# Patient Record
Sex: Female | Born: 1940
Health system: Southern US, Community
[De-identification: ages and names within clinical notes are randomized; demographics above are authoritative.]

## PROBLEM LIST (undated history)

## (undated) DIAGNOSIS — R911 Solitary pulmonary nodule: Secondary | ICD-10-CM

## (undated) DIAGNOSIS — F419 Anxiety disorder, unspecified: Secondary | ICD-10-CM

## (undated) DIAGNOSIS — M1990 Unspecified osteoarthritis, unspecified site: Secondary | ICD-10-CM

## (undated) DIAGNOSIS — G2581 Restless legs syndrome: Secondary | ICD-10-CM

## (undated) DIAGNOSIS — T8859XA Other complications of anesthesia, initial encounter: Secondary | ICD-10-CM

## (undated) DIAGNOSIS — I77810 Thoracic aortic ectasia: Secondary | ICD-10-CM

## (undated) DIAGNOSIS — C649 Malignant neoplasm of unspecified kidney, except renal pelvis: Secondary | ICD-10-CM

## (undated) DIAGNOSIS — E039 Hypothyroidism, unspecified: Secondary | ICD-10-CM

## (undated) DIAGNOSIS — I712 Thoracic aortic aneurysm, without rupture, unspecified: Secondary | ICD-10-CM

## (undated) DIAGNOSIS — R200 Anesthesia of skin: Secondary | ICD-10-CM

## (undated) DIAGNOSIS — E785 Hyperlipidemia, unspecified: Secondary | ICD-10-CM

## (undated) DIAGNOSIS — Z8719 Personal history of other diseases of the digestive system: Secondary | ICD-10-CM

## (undated) DIAGNOSIS — R7301 Impaired fasting glucose: Secondary | ICD-10-CM

## (undated) DIAGNOSIS — H9190 Unspecified hearing loss, unspecified ear: Secondary | ICD-10-CM

## (undated) DIAGNOSIS — N8501 Benign endometrial hyperplasia: Secondary | ICD-10-CM

## (undated) DIAGNOSIS — T4145XA Adverse effect of unspecified anesthetic, initial encounter: Secondary | ICD-10-CM

## (undated) DIAGNOSIS — J45909 Unspecified asthma, uncomplicated: Secondary | ICD-10-CM

## (undated) DIAGNOSIS — R55 Syncope and collapse: Secondary | ICD-10-CM

## (undated) DIAGNOSIS — C801 Malignant (primary) neoplasm, unspecified: Secondary | ICD-10-CM

## (undated) DIAGNOSIS — I499 Cardiac arrhythmia, unspecified: Secondary | ICD-10-CM

## (undated) DIAGNOSIS — L719 Rosacea, unspecified: Secondary | ICD-10-CM

## (undated) DIAGNOSIS — I10 Essential (primary) hypertension: Secondary | ICD-10-CM

## (undated) DIAGNOSIS — K635 Polyp of colon: Secondary | ICD-10-CM

## (undated) DIAGNOSIS — R319 Hematuria, unspecified: Secondary | ICD-10-CM

## (undated) DIAGNOSIS — M8580 Other specified disorders of bone density and structure, unspecified site: Secondary | ICD-10-CM

## (undated) HISTORY — DX: Restless legs syndrome: G25.81

## (undated) HISTORY — DX: Impaired fasting glucose: R73.01

## (undated) HISTORY — DX: Solitary pulmonary nodule: R91.1

## (undated) HISTORY — DX: Thoracic aortic ectasia: I77.810

## (undated) HISTORY — PX: SP CHOLECYSTOMY: HXRAD409

## (undated) HISTORY — DX: Malignant neoplasm of unspecified kidney, except renal pelvis: C64.9

## (undated) HISTORY — DX: Other specified disorders of bone density and structure, unspecified site: M85.80

## (undated) HISTORY — DX: Polyp of colon: K63.5

## (undated) HISTORY — DX: Thoracic aortic aneurysm, without rupture: I71.2

## (undated) HISTORY — DX: Hyperlipidemia, unspecified: E78.5

## (undated) HISTORY — PX: EYE SURGERY: SHX253

## (undated) HISTORY — PX: BUNIONECTOMY: SHX129

## (undated) HISTORY — DX: Hypothyroidism, unspecified: E03.9

## (undated) HISTORY — PX: OTHER SURGICAL HISTORY: SHX169

## (undated) HISTORY — DX: Benign endometrial hyperplasia: N85.01

## (undated) HISTORY — DX: Essential (primary) hypertension: I10

## (undated) HISTORY — PX: TONSILLECTOMY: SUR1361

## (undated) HISTORY — DX: Unspecified asthma, uncomplicated: J45.909

## (undated) HISTORY — DX: Rosacea, unspecified: L71.9

---

## 1974-10-18 HISTORY — PX: TUBAL LIGATION: SHX77

## 1988-10-18 HISTORY — PX: CRANIECTOMY FOR EXCISION OF ACOUSTIC NEUROMA: SUR324

## 1998-04-23 ENCOUNTER — Emergency Department (HOSPITAL_COMMUNITY): Admission: EM | Admit: 1998-04-23 | Discharge: 1998-04-23 | Payer: Self-pay | Admitting: Emergency Medicine

## 1999-01-15 ENCOUNTER — Other Ambulatory Visit: Admission: RE | Admit: 1999-01-15 | Discharge: 1999-01-15 | Payer: Self-pay | Admitting: Gynecology

## 1999-10-27 ENCOUNTER — Encounter (INDEPENDENT_AMBULATORY_CARE_PROVIDER_SITE_OTHER): Payer: Self-pay | Admitting: Specialist

## 1999-10-27 ENCOUNTER — Other Ambulatory Visit: Admission: RE | Admit: 1999-10-27 | Discharge: 1999-10-27 | Payer: Self-pay | Admitting: Gynecology

## 2000-01-19 ENCOUNTER — Other Ambulatory Visit: Admission: RE | Admit: 2000-01-19 | Discharge: 2000-01-19 | Payer: Self-pay | Admitting: Gynecology

## 2001-01-18 ENCOUNTER — Other Ambulatory Visit: Admission: RE | Admit: 2001-01-18 | Discharge: 2001-01-18 | Payer: Self-pay | Admitting: Gynecology

## 2001-01-20 ENCOUNTER — Other Ambulatory Visit: Admission: RE | Admit: 2001-01-20 | Discharge: 2001-01-20 | Payer: Self-pay | Admitting: Gynecology

## 2001-01-20 ENCOUNTER — Encounter (INDEPENDENT_AMBULATORY_CARE_PROVIDER_SITE_OTHER): Payer: Self-pay

## 2002-03-20 ENCOUNTER — Other Ambulatory Visit: Admission: RE | Admit: 2002-03-20 | Discharge: 2002-03-20 | Payer: Self-pay | Admitting: Gynecology

## 2002-10-18 HISTORY — PX: CATARACT EXTRACTION: SUR2

## 2004-05-19 ENCOUNTER — Other Ambulatory Visit: Admission: RE | Admit: 2004-05-19 | Discharge: 2004-05-19 | Payer: Self-pay | Admitting: Obstetrics and Gynecology

## 2004-09-23 ENCOUNTER — Encounter: Admission: RE | Admit: 2004-09-23 | Discharge: 2004-09-23 | Payer: Self-pay | Admitting: Internal Medicine

## 2005-02-04 ENCOUNTER — Encounter: Admission: RE | Admit: 2005-02-04 | Discharge: 2005-02-04 | Payer: Self-pay | Admitting: Otolaryngology

## 2005-06-25 ENCOUNTER — Other Ambulatory Visit: Admission: RE | Admit: 2005-06-25 | Discharge: 2005-06-25 | Payer: Self-pay | Admitting: Obstetrics and Gynecology

## 2005-10-18 HISTORY — PX: WRIST SURGERY: SHX841

## 2006-10-18 HISTORY — PX: OTHER SURGICAL HISTORY: SHX169

## 2006-10-26 ENCOUNTER — Other Ambulatory Visit: Admission: RE | Admit: 2006-10-26 | Discharge: 2006-10-26 | Payer: Self-pay | Admitting: Obstetrics and Gynecology

## 2008-05-01 ENCOUNTER — Other Ambulatory Visit: Admission: RE | Admit: 2008-05-01 | Discharge: 2008-05-01 | Payer: Self-pay | Admitting: Obstetrics and Gynecology

## 2008-10-18 HISTORY — PX: EXCISION MORTON'S NEUROMA: SHX5013

## 2008-10-18 HISTORY — PX: OTHER SURGICAL HISTORY: SHX169

## 2009-05-15 ENCOUNTER — Encounter: Admission: RE | Admit: 2009-05-15 | Discharge: 2009-05-15 | Payer: Self-pay | Admitting: Internal Medicine

## 2009-05-28 ENCOUNTER — Encounter (INDEPENDENT_AMBULATORY_CARE_PROVIDER_SITE_OTHER): Payer: Self-pay | Admitting: Interventional Radiology

## 2009-05-28 ENCOUNTER — Other Ambulatory Visit: Admission: RE | Admit: 2009-05-28 | Discharge: 2009-05-28 | Payer: Self-pay | Admitting: Interventional Radiology

## 2009-05-28 ENCOUNTER — Encounter: Admission: RE | Admit: 2009-05-28 | Discharge: 2009-05-28 | Payer: Self-pay | Admitting: Internal Medicine

## 2009-11-18 HISTORY — PX: THYROIDECTOMY: SHX17

## 2011-04-14 ENCOUNTER — Other Ambulatory Visit: Payer: Self-pay | Admitting: Gastroenterology

## 2011-04-19 ENCOUNTER — Ambulatory Visit
Admission: RE | Admit: 2011-04-19 | Discharge: 2011-04-19 | Disposition: A | Discharge status: Home / Self Care | Payer: Medicare Other | Source: Ambulatory Visit | Attending: Gastroenterology | Admitting: Gastroenterology

## 2011-04-19 MED ORDER — IOHEXOL 300 MG/ML  SOLN: 125.0000 mL | Freq: Once | INTRAMUSCULAR | Status: AC | PRN

## 2011-10-19 DIAGNOSIS — I7121 Aneurysm of the ascending aorta, without rupture: Secondary | ICD-10-CM

## 2011-10-19 DIAGNOSIS — I712 Thoracic aortic aneurysm, without rupture: Secondary | ICD-10-CM

## 2011-10-19 HISTORY — PX: EYE SURGERY: SHX253

## 2011-10-19 HISTORY — DX: Thoracic aortic aneurysm, without rupture: I71.2

## 2011-10-19 HISTORY — DX: Aneurysm of the ascending aorta, without rupture: I71.21

## 2011-11-03 DIAGNOSIS — D235 Other benign neoplasm of skin of trunk: Secondary | ICD-10-CM | POA: Diagnosis not present

## 2011-11-03 DIAGNOSIS — L570 Actinic keratosis: Secondary | ICD-10-CM | POA: Diagnosis not present

## 2011-12-14 DIAGNOSIS — H43819 Vitreous degeneration, unspecified eye: Secondary | ICD-10-CM | POA: Diagnosis not present

## 2011-12-14 DIAGNOSIS — H35379 Puckering of macula, unspecified eye: Secondary | ICD-10-CM | POA: Diagnosis not present

## 2011-12-16 DIAGNOSIS — E785 Hyperlipidemia, unspecified: Secondary | ICD-10-CM | POA: Diagnosis not present

## 2011-12-16 DIAGNOSIS — I10 Essential (primary) hypertension: Secondary | ICD-10-CM | POA: Diagnosis not present

## 2011-12-16 DIAGNOSIS — E739 Lactose intolerance, unspecified: Secondary | ICD-10-CM | POA: Diagnosis not present

## 2011-12-16 DIAGNOSIS — E669 Obesity, unspecified: Secondary | ICD-10-CM | POA: Diagnosis not present

## 2012-02-04 DIAGNOSIS — E039 Hypothyroidism, unspecified: Secondary | ICD-10-CM | POA: Diagnosis not present

## 2012-03-09 DIAGNOSIS — M216X9 Other acquired deformities of unspecified foot: Secondary | ICD-10-CM | POA: Diagnosis not present

## 2012-03-16 DIAGNOSIS — Z1231 Encounter for screening mammogram for malignant neoplasm of breast: Secondary | ICD-10-CM | POA: Diagnosis not present

## 2012-03-24 DIAGNOSIS — L719 Rosacea, unspecified: Secondary | ICD-10-CM | POA: Diagnosis not present

## 2012-03-24 DIAGNOSIS — L570 Actinic keratosis: Secondary | ICD-10-CM | POA: Diagnosis not present

## 2012-03-24 DIAGNOSIS — D235 Other benign neoplasm of skin of trunk: Secondary | ICD-10-CM | POA: Diagnosis not present

## 2012-03-31 DIAGNOSIS — H43399 Other vitreous opacities, unspecified eye: Secondary | ICD-10-CM | POA: Diagnosis not present

## 2012-03-31 DIAGNOSIS — H26499 Other secondary cataract, unspecified eye: Secondary | ICD-10-CM | POA: Diagnosis not present

## 2012-05-23 DIAGNOSIS — K219 Gastro-esophageal reflux disease without esophagitis: Secondary | ICD-10-CM | POA: Diagnosis not present

## 2012-05-23 DIAGNOSIS — R1011 Right upper quadrant pain: Secondary | ICD-10-CM | POA: Diagnosis not present

## 2012-05-23 DIAGNOSIS — E785 Hyperlipidemia, unspecified: Secondary | ICD-10-CM | POA: Diagnosis not present

## 2012-05-23 DIAGNOSIS — I10 Essential (primary) hypertension: Secondary | ICD-10-CM | POA: Diagnosis not present

## 2012-06-27 DIAGNOSIS — E785 Hyperlipidemia, unspecified: Secondary | ICD-10-CM | POA: Diagnosis not present

## 2012-06-27 DIAGNOSIS — M899 Disorder of bone, unspecified: Secondary | ICD-10-CM | POA: Diagnosis not present

## 2012-06-27 DIAGNOSIS — H35379 Puckering of macula, unspecified eye: Secondary | ICD-10-CM | POA: Diagnosis not present

## 2012-06-27 DIAGNOSIS — M949 Disorder of cartilage, unspecified: Secondary | ICD-10-CM | POA: Diagnosis not present

## 2012-06-27 DIAGNOSIS — R7301 Impaired fasting glucose: Secondary | ICD-10-CM | POA: Diagnosis not present

## 2012-06-27 DIAGNOSIS — H43819 Vitreous degeneration, unspecified eye: Secondary | ICD-10-CM | POA: Diagnosis not present

## 2012-06-27 DIAGNOSIS — E039 Hypothyroidism, unspecified: Secondary | ICD-10-CM | POA: Diagnosis not present

## 2012-06-27 DIAGNOSIS — I10 Essential (primary) hypertension: Secondary | ICD-10-CM | POA: Diagnosis not present

## 2012-07-04 DIAGNOSIS — Z0000 Encounter for general adult medical examination without abnormal findings: Secondary | ICD-10-CM | POA: Diagnosis not present

## 2012-07-04 DIAGNOSIS — Z23 Encounter for immunization: Secondary | ICD-10-CM | POA: Diagnosis not present

## 2012-07-04 DIAGNOSIS — E039 Hypothyroidism, unspecified: Secondary | ICD-10-CM | POA: Diagnosis not present

## 2012-07-04 DIAGNOSIS — I10 Essential (primary) hypertension: Secondary | ICD-10-CM | POA: Diagnosis not present

## 2012-07-04 DIAGNOSIS — E785 Hyperlipidemia, unspecified: Secondary | ICD-10-CM | POA: Diagnosis not present

## 2012-07-05 DIAGNOSIS — Z124 Encounter for screening for malignant neoplasm of cervix: Secondary | ICD-10-CM | POA: Diagnosis not present

## 2012-07-05 DIAGNOSIS — Z01419 Encounter for gynecological examination (general) (routine) without abnormal findings: Secondary | ICD-10-CM | POA: Diagnosis not present

## 2012-07-05 DIAGNOSIS — J984 Other disorders of lung: Secondary | ICD-10-CM | POA: Diagnosis not present

## 2012-07-06 DIAGNOSIS — Z1212 Encounter for screening for malignant neoplasm of rectum: Secondary | ICD-10-CM | POA: Diagnosis not present

## 2012-07-19 DIAGNOSIS — M899 Disorder of bone, unspecified: Secondary | ICD-10-CM | POA: Diagnosis not present

## 2012-08-07 ENCOUNTER — Institutional Professional Consult (permissible substitution) (INDEPENDENT_AMBULATORY_CARE_PROVIDER_SITE_OTHER): Payer: Medicare Other | Admitting: Cardiothoracic Surgery

## 2012-08-07 VITALS — BP 132/85 | HR 92 | Resp 20 | Ht 64.5 in | Wt 210.0 lb

## 2012-08-07 DIAGNOSIS — I712 Thoracic aortic aneurysm, without rupture: Secondary | ICD-10-CM

## 2012-08-07 DIAGNOSIS — I77810 Thoracic aortic ectasia: Secondary | ICD-10-CM

## 2012-08-07 HISTORY — DX: Thoracic aortic ectasia: I77.810

## 2012-08-07 NOTE — Patient Instructions (Signed)
Thoracic Aortic Aneurysm An aneurysm is the enlargement (dilatation), bulging or ballooning out of part of the wall of a vein or artery. An Aortic Aneurysm is a bulging in the largest artery of the body. This artery supplies blood from the heart to the rest of the body. The first part of the aorta is called the thoracic aorta. It leaves the heart, ascends (rises), arches, and descends (goes down) through the chest until it reaches the diaphragm (the muscular partition between the chest and abdomen (belly). The second part of the aorta is then called the abdominal aorta after it has passed the diaphragm and continues down through the abdomen. The abdominal aorta ends where it splits to form the two iliac arteries that go to the legs. Aortic aneurysms can develop anywhere along the length of the aorta. A thoracic aortic aneurysm (TAA) occurs in the first part of the aorta, between the heart and the diaphragm. The major importance of an aneurysm is that it can rupture or tear (dissect), causing death unless diagnosed and treated promptly. CAUSES  Most thoracic aortic aneurysms are related to arteriosclerosis. The arteriosclerosis can weaken the aortic wall. The pressure of the blood being pumped through the aorta causes it to balloon out at the site of weakness. Therefore, elevated blood pressure (hypertension) is associated with aneurysm. Other risk factors include:  Age over 60.  Tobacco use.  Female sex.  Family history of aneurysm. Additional causes of thoracic aortic aneurysms include:  Genetics (passed by birth).  Injury: After physical trauma to the aorta.  Inflammation of blood vessels.  Hardening of the arteries.  Infection. SYMPTOMS  Many aneurysms do not cause problems. A small, unchanging or slowly changing aneurysm may produce no symptoms until it suddenly ruptures or dissects (separation of the layers of the aortic wall) without warning. It may then cause death. The symptoms  (problems) of a developing aneurysm will partly depend on its size and rate of growth. Thoracic aortic aneurysms may cause pain in the:  Chest.  Back.  Sides.  Abdomen. The pain most often has a deep quality as if it is boring into the person. It may cause:  Heart failure.  Heart attack.  Hoarseness.  Cough.  Shortness of breath.  Swallowing problems. DIAGNOSIS  A thoracic aortic aneurysm may be suspected based on your symptoms. It may also be detected by x-ray or CT studies done for unrelated reasons.  Several different imaging studies can be used to confirm a TAA:  An echocardiogram is an ultrasound test to examine the heart. It can also examine the first parts of the aorta. Sometimes, this test is done by putting you to sleep and inserting a flexible telescope through your mouth into your esophagus, which is next to the aorta; excellent pictures of the aorta can be obtained. This is called a transesophageal echocardiogram (TEE).  CT scanning of the chest is accurate at showing the exact size and shape of the aneurysm.  MRI scanning is accurate, and is used for certain types of TAA.  An aortic angiogram shows the source of the major blood vessels arising from the aorta. It reveals the size and extent of any aneurysm. It can also show a clot clinging to the wall of the aneurysm (mural thrombus). The angiogram may give information about a tear of the aorta. TREATMENT  Treatment for a thoracic aortic aneurysm depends on:  Location.  Size.  Other factors.  Rate of growth.  Underlying cause. Medical treatment is used   for smaller or complicated aneurysms, or those that do not cause symptoms. These include:  Stopping smoking.  Blood pressure control.  Control of cholesterol. Surgical treatment is used for aneurysms that cause symptoms, or for those that are large or growing in size. The surgical technique depends on the location of the aneurysm. HOME CARE INSTRUCTIONS     If you smoke, stop. If you do not, do not start.  Take all medications as prescribed.  Your caregiver will tell you when to have your aneurysm rechecked, either by echocardiogram or CT scan. Be sure to keep this and all follow-up appointments. SEEK MEDICAL CARE IF:   You develop mild pain in your chest, upper back, sides, or abdomen.  You develop cough, hoarseness or trouble swallowing. SEEK IMMEDIATE MEDICAL CARE IF:   You develop severe chest or abdominal pain, or severe pain moving (radiating) to your back.  You suddenly develop cold or blue toes or feet.  You suddenly develop lightheadedness or fainting spells.  You develop trouble breathing. Document Released: 10/04/2005 Document Revised: 12/27/2011 Document Reviewed: 08/23/2007 [employer] Patient Information 2013 [employer].  

## 2012-08-07 NOTE — Progress Notes (Signed)
[address]            [place] [address]          [phone]      [facility] Medical Record #[id number] Date of Birth: [birth year]  Referring: Emily Baars, MD Primary Care: Emily Baars, MD  Chief Complaint:    Chief Complaint  Patient presents with  . Thoracic Aortic Aneurysm    Referral from Dr [facility] for surgical eval on dilation of the ascending aorta, Chest CT 07/05/12      History of Present Illness:    Patient referred because of the incidental finding of a mildly dilated descending aorta approximately 3.8-3.9 cm in size, without family history of Marfan's or acute dissection. Patient has no known aortic valve disease. She's had several previous CT scans in 2010 and 2011, unfortunately no reports on these scans were available when seen, and the discs that the patient brought could not be opened. We have been able to review her most recent CT scan done September 2013.      Current Activity/ Functional Status: Patient is independent with mobility/ambulation, transfers, ADL's, IADL's.   Past Medical History  Diagnosis Date  . Ascending aorta dilation 08/07/2012  . Nodule of left lung     stable, surveillance  Dr [facility]  . RLS (restless legs syndrome)   . Hyperlipidemia   . Asthma   . Hypertension   . [name]   . Rosacea   . Osteopenia   . IFG (impaired fasting glucose)   . Hyperplastic colon polyp   . Hypothyroidism     Past Surgical History  Procedure Date  . Thyroidectomy 11/2009    S/P Adenoma follicular vs hyperplastic  . Left shoulder surgery 2005   . Lt wrist surgery 2005   . Acoustic neuroma s/p resection lt ear deafness 1990   . Btl   . Bumonectomy   . Lt knee surgery 2008   . Sp cholecystomy     Family History  Problem Relation Age of Onset  . Cancer Father   . Cancer Maternal Grandmother   . Heart disease Maternal Grandfather   . Stroke Maternal Grandfather     History    Social History  . Marital Status: Married    Spouse Name: N/A    Number of Children: 4  . Years of Education: N/A   Occupational History  . CPA   . controller part time    Social History Main Topics  . Smoking status: Passive Smoke Exposure - Never Smoker  . Smokeless tobacco: Never Used  . Alcohol Use: Yes  . Drug Use: No          History  Smoking status  . Passive Smoke Exposure - Never Smoker  Smokeless tobacco  . Never Used    History  Alcohol Use  . Yes     Allergies  Allergen Reactions  . Bactrim (Sulfamethoxazole-Tmp Ds) Rash  . Penicillins Rash    Current Outpatient Prescriptions  Medication Sig Dispense Refill  . calcium carbonate (OS-CAL) 600 MG TABS Take 600 mg by mouth 2 (two) times daily with a meal.      . cholecalciferol (VITAMIN D) 1000 UNITS tablet Take 1,000 Units by mouth daily.      Marland Kitchen doxycycline (VIBRAMYCIN) 100 MG capsule Take 100 mg by mouth 2 (two) times daily. PRN      .  fexofenadine (ALLEGRA) 180 MG tablet Take 180 mg by mouth daily.      . fish oil-omega-3 fatty acids 1000 MG capsule Take 2 g by mouth daily.      . folic acid (FOLVITE) 800 MCG tablet Take 400 mcg by mouth daily.      Marland Kitchen guaiFENesin (MUCINEX) 600 MG 12 hr tablet Take 1,200 mg by mouth 2 (two) times daily. PRN      . levalbuterol (XOPENEX HFA) 45 MCG/ACT inhaler Inhale 1-2 puffs into the lungs every 4 (four) hours as needed.      Marland Kitchen levothyroxine (SYNTHROID, LEVOTHROID) 125 MCG tablet Take 125 mcg by mouth daily.      . montelukast (SINGULAIR) 10 MG tablet Take 10 mg by mouth at bedtime.      . Multiple Vitamin (MULTIVITAMIN) capsule Take 1 capsule by mouth daily.      . Phentermine-Topiramate (QSYMIA) 7.5-46 MG CP24 Take by mouth.      . telmisartan (MICARDIS) 40 MG tablet Take 40 mg by mouth daily.           Review of Systems:     Cardiac Review of Systems: Y or N  Chest Pain [  n  ]  Resting SOB [ y  ] Exertional SOB  [ y ]  Orthopnea [ y ]   Pedal Edema [ y   ]    Palpitations [place].Etienne  ] Syncope  [  n]   Presyncope [ n  ]  General Review of Systems: [Y] = yes [  ]=no Constitional: recent weight change [  ]; anorexia [  ]; fatigue [  ]; nausea [  ]; night sweats [  ]; fever [  ]; or chills [  ];                                                                                                                                          Dental: poor dentition[ n ];   Eye : blurred vision [  ]; diplopia [   ]; vision changes [ y ];  Amaurosis fugax[  ]; Resp: cough [  ];  wheezing[ y ];  hemoptysis[  ]; shortness of breath[  ]; paroxysmal nocturnal dyspnea[  ]; dyspnea on exertion[  ]; or orthopnea[  ];  GI:  gallstones[  ], vomiting[  ];  dysphagia[  ]; melena[  ];  hematochezia [  ]; heartburn[  ];   Hx of  Colonoscopy[  ]; GU: kidney stones [  ]; hematuria[  ];   dysuria [  ];  nocturia[  ];  history of     obstruction [  ];             Skin: rash, swelling[  ];, hair loss[  ];  peripheral edema[  ];  or itching[  ]; Musculosketetal: myalgias[ both thumb joints ];  joint swelling[  y ];  joint erythema[y  ];  joint pain[y  ];  back pain[  y];  Heme/Lymph: bruising[  ];  bleeding[  ];  anemia[  ];  Neuro: TIA[  ];  headaches[  ];  stroke[  ];  vertigo[  ];  seizures[  ];   paresthesias[  ];  difficulty walking[  ];numbness left leg  Psych:depression[  ]; anxiety[  ];  Endocrine: diabetes[ n ];  thyroid dysfunction[y  ];  Immunizations: Flu [  ]; Pneumococcal[  ];  Other:  Physical Exam: BP 132/85  Pulse 92  Resp 20  Ht 5' 4.5" (1.638 m)  Wt 210 lb (95.255 kg)  BMI 35.49 kg/m2  SpO2 96%  General appearance: alert, cooperative, appears stated age and no distress Neurologic: intact Heart: regular rate and rhythm, S1, S2 normal, no murmur, click, rub or gallop and normal apical impulse Lungs: clear to auscultation bilaterally and normal percussion bilaterally Abdomen: soft, non-tender; bowel sounds normal; no masses,  no organomegaly Extremities:  extremities normal, atraumatic, no cyanosis or edema, Homans sign is negative, no sign of DVT, no edema, redness or tenderness in the calves or thighs, no ulcers, gangrene or trophic changes and The patient has no carotid bruits. She has no cervical or supraclavicular adenopathy   Diagnostic Studies & Laboratory data:     Recent Radiology Findings:  See report from tried imaging CT scan dated 07/06/2012 #1 stable chest CT with irregular scar within the anterior lung base stable over 2 years . Dilation of the ascending aorta 3.8-3.9 cm.   Recent Lab Findings: No results found for this basename: WBC,  HGB,  HCT,  PLT,  GLUCOSE,  CHOL,  TRIG,  HDL,  LDLDIRECT,  LDLCALC,  ALT,  AST,  NA,  K,  CL,  CREATININE,  BUN,  CO2,  TSH,  INR,  GLUF,  HGBA1C      Assessment / Plan:   Mildly dilated descending aorta without history or physical findings of aortic valve disease.  I have reviewed with the patient the most recent CT scan and discussed with her that we would consider elective replacement of her a ascending aorta and the 5.5 cm range. We have requested [facility] 2 for the results of her old scans so we can compare the size of her aorta previously. I plan to see her back in one year with a followup CTA  scan of the chest     Delight Ovens MD  Beeper [phone] Office [phone] 08/07/2012 4:09 PM

## 2012-08-24 DIAGNOSIS — M19049 Primary osteoarthritis, unspecified hand: Secondary | ICD-10-CM | POA: Diagnosis not present

## 2012-08-24 DIAGNOSIS — M6530 Trigger finger, unspecified finger: Secondary | ICD-10-CM | POA: Diagnosis not present

## 2012-09-13 DIAGNOSIS — R7301 Impaired fasting glucose: Secondary | ICD-10-CM | POA: Diagnosis not present

## 2012-09-13 DIAGNOSIS — R0602 Shortness of breath: Secondary | ICD-10-CM | POA: Diagnosis not present

## 2012-09-13 DIAGNOSIS — R42 Dizziness and giddiness: Secondary | ICD-10-CM | POA: Diagnosis not present

## 2012-09-13 DIAGNOSIS — I10 Essential (primary) hypertension: Secondary | ICD-10-CM | POA: Diagnosis not present

## 2012-09-18 DIAGNOSIS — M6530 Trigger finger, unspecified finger: Secondary | ICD-10-CM | POA: Diagnosis not present

## 2012-10-03 DIAGNOSIS — J45909 Unspecified asthma, uncomplicated: Secondary | ICD-10-CM | POA: Diagnosis not present

## 2012-10-31 DIAGNOSIS — J45909 Unspecified asthma, uncomplicated: Secondary | ICD-10-CM | POA: Diagnosis not present

## 2012-10-31 DIAGNOSIS — I10 Essential (primary) hypertension: Secondary | ICD-10-CM | POA: Diagnosis not present

## 2012-10-31 DIAGNOSIS — E785 Hyperlipidemia, unspecified: Secondary | ICD-10-CM | POA: Diagnosis not present

## 2012-10-31 DIAGNOSIS — R7301 Impaired fasting glucose: Secondary | ICD-10-CM | POA: Diagnosis not present

## 2012-10-31 DIAGNOSIS — E039 Hypothyroidism, unspecified: Secondary | ICD-10-CM | POA: Diagnosis not present

## 2013-02-06 DIAGNOSIS — J45909 Unspecified asthma, uncomplicated: Secondary | ICD-10-CM | POA: Diagnosis not present

## 2013-02-06 DIAGNOSIS — J309 Allergic rhinitis, unspecified: Secondary | ICD-10-CM | POA: Diagnosis not present

## 2013-03-06 DIAGNOSIS — H26499 Other secondary cataract, unspecified eye: Secondary | ICD-10-CM | POA: Diagnosis not present

## 2013-03-06 DIAGNOSIS — H01119 Allergic dermatitis of unspecified eye, unspecified eyelid: Secondary | ICD-10-CM | POA: Diagnosis not present

## 2013-03-06 DIAGNOSIS — H43399 Other vitreous opacities, unspecified eye: Secondary | ICD-10-CM | POA: Diagnosis not present

## 2013-03-06 DIAGNOSIS — H11159 Pinguecula, unspecified eye: Secondary | ICD-10-CM | POA: Diagnosis not present

## 2013-03-27 DIAGNOSIS — Z1231 Encounter for screening mammogram for malignant neoplasm of breast: Secondary | ICD-10-CM | POA: Diagnosis not present

## 2013-06-19 DIAGNOSIS — J45909 Unspecified asthma, uncomplicated: Secondary | ICD-10-CM | POA: Diagnosis not present

## 2013-06-19 DIAGNOSIS — J309 Allergic rhinitis, unspecified: Secondary | ICD-10-CM | POA: Diagnosis not present

## 2013-06-28 DIAGNOSIS — E785 Hyperlipidemia, unspecified: Secondary | ICD-10-CM | POA: Diagnosis not present

## 2013-06-28 DIAGNOSIS — E039 Hypothyroidism, unspecified: Secondary | ICD-10-CM | POA: Diagnosis not present

## 2013-06-28 DIAGNOSIS — I10 Essential (primary) hypertension: Secondary | ICD-10-CM | POA: Diagnosis not present

## 2013-06-28 DIAGNOSIS — R82998 Other abnormal findings in urine: Secondary | ICD-10-CM | POA: Diagnosis not present

## 2013-06-28 DIAGNOSIS — M79609 Pain in unspecified limb: Secondary | ICD-10-CM | POA: Diagnosis not present

## 2013-06-28 DIAGNOSIS — R7301 Impaired fasting glucose: Secondary | ICD-10-CM | POA: Diagnosis not present

## 2013-06-28 DIAGNOSIS — M899 Disorder of bone, unspecified: Secondary | ICD-10-CM | POA: Diagnosis not present

## 2013-07-03 ENCOUNTER — Encounter: Payer: Self-pay | Admitting: Obstetrics and Gynecology

## 2013-07-06 DIAGNOSIS — R7301 Impaired fasting glucose: Secondary | ICD-10-CM | POA: Diagnosis not present

## 2013-07-06 DIAGNOSIS — J45909 Unspecified asthma, uncomplicated: Secondary | ICD-10-CM | POA: Diagnosis not present

## 2013-07-06 DIAGNOSIS — K7689 Other specified diseases of liver: Secondary | ICD-10-CM | POA: Diagnosis not present

## 2013-07-06 DIAGNOSIS — R059 Cough, unspecified: Secondary | ICD-10-CM | POA: Diagnosis not present

## 2013-07-06 DIAGNOSIS — E785 Hyperlipidemia, unspecified: Secondary | ICD-10-CM | POA: Diagnosis not present

## 2013-07-06 DIAGNOSIS — Z1331 Encounter for screening for depression: Secondary | ICD-10-CM | POA: Diagnosis not present

## 2013-07-06 DIAGNOSIS — R05 Cough: Secondary | ICD-10-CM | POA: Diagnosis not present

## 2013-07-06 DIAGNOSIS — E039 Hypothyroidism, unspecified: Secondary | ICD-10-CM | POA: Diagnosis not present

## 2013-07-06 DIAGNOSIS — Z0000 Encounter for general adult medical examination without abnormal findings: Secondary | ICD-10-CM | POA: Diagnosis not present

## 2013-07-06 DIAGNOSIS — I712 Thoracic aortic aneurysm, without rupture: Secondary | ICD-10-CM | POA: Diagnosis not present

## 2013-07-06 DIAGNOSIS — Z23 Encounter for immunization: Secondary | ICD-10-CM | POA: Diagnosis not present

## 2013-07-06 DIAGNOSIS — I10 Essential (primary) hypertension: Secondary | ICD-10-CM | POA: Diagnosis not present

## 2013-07-09 DIAGNOSIS — L570 Actinic keratosis: Secondary | ICD-10-CM | POA: Diagnosis not present

## 2013-07-09 DIAGNOSIS — D235 Other benign neoplasm of skin of trunk: Secondary | ICD-10-CM | POA: Diagnosis not present

## 2013-07-10 ENCOUNTER — Ambulatory Visit: Payer: Self-pay | Admitting: Obstetrics and Gynecology

## 2013-07-10 ENCOUNTER — Ambulatory Visit (INDEPENDENT_AMBULATORY_CARE_PROVIDER_SITE_OTHER): Payer: Medicare Other | Admitting: Gynecology

## 2013-07-10 ENCOUNTER — Encounter: Payer: Self-pay | Admitting: Gynecology

## 2013-07-10 VITALS — BP 130/84 | HR 82 | Resp 18 | Ht 64.75 in | Wt 206.0 lb

## 2013-07-10 DIAGNOSIS — Z01419 Encounter for gynecological examination (general) (routine) without abnormal findings: Secondary | ICD-10-CM

## 2013-07-10 DIAGNOSIS — J45909 Unspecified asthma, uncomplicated: Secondary | ICD-10-CM | POA: Insufficient documentation

## 2013-07-10 DIAGNOSIS — Z1212 Encounter for screening for malignant neoplasm of rectum: Secondary | ICD-10-CM | POA: Diagnosis not present

## 2013-07-10 DIAGNOSIS — I10 Essential (primary) hypertension: Secondary | ICD-10-CM | POA: Insufficient documentation

## 2013-07-10 DIAGNOSIS — E7800 Pure hypercholesterolemia, unspecified: Secondary | ICD-10-CM | POA: Insufficient documentation

## 2013-07-10 DIAGNOSIS — G2581 Restless legs syndrome: Secondary | ICD-10-CM | POA: Insufficient documentation

## 2013-07-10 NOTE — Progress Notes (Signed)
72 y.o. Married Caucasian female   [phone] here for annual exam. Pt reports menses absent She does not report hot flashes, does not have night sweats, does not have vaginal dryness.  She is using lubricants, [place] Jelly.  She does not report post-menopasual bleeding.  On food lovers diet-low gylcemic index.  Pt is using [place] Jelly.  Pt feels like she is able to empty bladder completely.  Patient's last menstrual period was 07/11/1983.          Sexually active: yes  The current method of family planning is post menopausal status.    Exercising: yes  Home exercise routine includes cardio 2x/wk. Last pap: 06/08/2010 Abnormal PAP: no Mammogram: 02/2013 BSE: no Colonoscopy: 2006 f/u in 10 years DEXA: 06/2012 Alcohol: no Tobacco: 0-1 drinks/wk  Hgb: PCP ; Urine: PCP  Health Maintenance  Topic Date Due  . Tetanus/tdap  08/08/1960  . Colonoscopy  08/09/1991  . Zostavax  08/08/2001  . Pneumococcal Polysaccharide Vaccine Age 59 And Over  08/08/2006  . Influenza Vaccine  05/18/2013    Family History  Problem Relation Age of Onset  . Cancer Father   . Cancer Maternal Grandmother   . Heart disease Maternal Grandfather   . Stroke Maternal Grandfather   . Hypertension Mother     Patient Active Problem List   Diagnosis Date Noted  . Ascending aorta dilation 08/07/2012    Past Medical History  Diagnosis Date  . Ascending aorta dilation 08/07/2012  . Nodule of left lung     stable, surveillance  Dr Clelia Croft  . RLS (restless legs syndrome)   . Hyperlipidemia   . Asthma   . Hypertension   . [name]   . Rosacea   . Osteopenia   . IFG (impaired fasting glucose)   . Hyperplastic colon polyp   . Hypothyroidism   . Asthma   . GERD (gastroesophageal reflux disease)   . Simple endometrial hyperplasia   . Aneurysm, ascending aorta 2013    4 cm    Past Surgical History  Procedure Laterality Date  . Thyroidectomy  11/2009    S/P Adenoma follicular vs hyperplastic  . Left shoulder  surgery 2005    . Lt wrist surgery 2005    . Acoustic neuroma s/p resection lt ear deafness 1990    . Btl    . Bumonectomy    . Lt knee surgery 2008 Left 2008    tears x 2  . Sp cholecystomy    . Craniectomy for excision of acoustic neuroma Left 1990    Deaf on Left side  . Eye surgery  2013    eyelid drooping fixed  . Wrist surgery Left 2007    wrist cyst removed  . Tonsillectomy  age 56  . Tubal ligation  1976  . Cataract extraction Bilateral 2004    Allergies: Avelox; Tramadol; Bactrim; and Penicillins  Current Outpatient Prescriptions  Medication Sig Dispense Refill  . B COMPLEX VITAMINS SL Place under the tongue.      Marland Kitchen BIOTIN PO Take by mouth.      . calcium carbonate (OS-CAL) 600 MG TABS Take 600 mg by mouth 2 (two) times daily with a meal.      . cholecalciferol (VITAMIN D) 1000 UNITS tablet Take 1,000 Units by mouth daily.      Marland Kitchen doxycycline (VIBRAMYCIN) 100 MG capsule Take 100 mg by mouth 2 (two) times daily. PRN      . famotidine (PEPCID) 10 MG tablet  Take 10 mg by mouth as needed for heartburn.      . fexofenadine (ALLEGRA) 180 MG tablet Take 180 mg by mouth daily.      . fish oil-omega-3 fatty acids 1000 MG capsule Take 2 g by mouth daily.      . Flaxseed, Linseed, (FLAXSEED OIL PO) Take by mouth.      . folic acid (FOLVITE) 800 MCG tablet Take 400 mcg by mouth daily.      Marland Kitchen glucosamine-chondroitin 500-400 MG tablet Take 1 tablet by mouth daily.      Marland Kitchen guaiFENesin (MUCINEX) 600 MG 12 hr tablet Take 1,200 mg by mouth 2 (two) times daily. PRN      . levalbuterol (XOPENEX HFA) 45 MCG/ACT inhaler Inhale 1-2 puffs into the lungs every 4 (four) hours as needed.      Marland Kitchen levothyroxine (SYNTHROID, LEVOTHROID) 125 MCG tablet Take 125 mcg by mouth daily.      . montelukast (SINGULAIR) 10 MG tablet Take 10 mg by mouth at bedtime.      . Multiple Vitamin (MULTIVITAMIN) capsule Take 1 capsule by mouth daily.      . Probiotic Product (ALIGN PO) Take by mouth.      . telmisartan  (MICARDIS) 40 MG tablet Take 40 mg by mouth daily.      Marland Kitchen UNABLE TO FIND Med Name: Black current oil      . UNABLE TO FIND Med Name: Pantothenic acid      . ZETIA 10 MG tablet daily.      Marland Kitchen QVAR 80 MCG/ACT inhaler        No current facility-administered medications for this visit.    ROS: Pertinent items are noted in HPI.  Exam:    BP 130/84  Pulse 82  Resp 18  Ht 5' 4.75" (1.645 m)  Wt 206 lb (93.441 kg)  BMI 34.53 kg/m2  LMP 07/11/1983 Weight change: @WEIGHTCHANGE @ Last 3 height recordings:  Ht Readings from Last 3 Encounters:  07/10/13 5' 4.75" (1.645 m)  08/07/12 5' 4.5" (1.638 m)   General appearance: alert, cooperative and appears stated age Head: Normocephalic, without obvious abnormality, atraumatic Neck: no adenopathy, no carotid bruit, no JVD, supple, symmetrical, trachea midline and thyroid not enlarged, symmetric, no tenderness/mass/nodules Lungs: clear to auscultation bilaterally Breasts: normal appearance, no masses or tenderness Heart: regular rate and rhythm, S1, S2 normal, no murmur, click, rub or gallop Abdomen: soft, non-tender; bowel sounds normal; no masses,  no organomegaly Extremities: extremities normal, atraumatic, no cyanosis or edema Skin: Skin color, texture, turgor normal. No rashes or lesions Lymph nodes: Cervical, supraclavicular, and axillary nodes normal. no inguinal nodes palpated Neurologic: Grossly normal   Pelvic: External genitalia:  no lesions              Urethra: normal appearing urethra with no masses, tenderness or lesions              Bartholins and Skenes: normal                 Vagina: normal appearing vagina with normal color and discharge, no lesions              Cervix: normal appearance              Pap taken: no        Bimanual Exam:  Uterus:  uterus is normal size, shape, consistency and nontender  Adnexa:    normal adnexa in size, nontender and no masses                                       Rectovaginal: Confirms                                      Anus:  normal sphincter tone, no lesions  A: well woman Contraceptive management     P: mammogram pap smear guidelines reviewed counseled on breast self exam, mammography screening, adequate intake of calcium and vitamin D, diet and exercise return annually or prn Discussed PAP guideline changes, importance of weight bearing exercises, calcium, vit D and balanced diet.  An After Visit Summary was printed and given to the patient.

## 2013-07-10 NOTE — Patient Instructions (Signed)

## 2013-07-13 ENCOUNTER — Other Ambulatory Visit: Payer: Self-pay | Admitting: *Deleted

## 2013-07-13 DIAGNOSIS — I77810 Thoracic aortic ectasia: Secondary | ICD-10-CM

## 2013-07-24 DIAGNOSIS — M76899 Other specified enthesopathies of unspecified lower limb, excluding foot: Secondary | ICD-10-CM | POA: Diagnosis not present

## 2013-07-25 DIAGNOSIS — L723 Sebaceous cyst: Secondary | ICD-10-CM | POA: Diagnosis not present

## 2013-07-25 DIAGNOSIS — L821 Other seborrheic keratosis: Secondary | ICD-10-CM | POA: Diagnosis not present

## 2013-07-31 DIAGNOSIS — M545 Low back pain, unspecified: Secondary | ICD-10-CM | POA: Diagnosis not present

## 2013-08-10 DIAGNOSIS — M545 Low back pain, unspecified: Secondary | ICD-10-CM | POA: Diagnosis not present

## 2013-08-14 DIAGNOSIS — M545 Low back pain, unspecified: Secondary | ICD-10-CM | POA: Diagnosis not present

## 2013-08-16 ENCOUNTER — Ambulatory Visit: Payer: Medicare Other | Admitting: Cardiothoracic Surgery

## 2013-08-16 ENCOUNTER — Other Ambulatory Visit: Payer: Medicare Other

## 2013-08-16 DIAGNOSIS — M545 Low back pain, unspecified: Secondary | ICD-10-CM | POA: Diagnosis not present

## 2013-08-21 DIAGNOSIS — M545 Low back pain, unspecified: Secondary | ICD-10-CM | POA: Diagnosis not present

## 2013-08-23 DIAGNOSIS — M545 Low back pain, unspecified: Secondary | ICD-10-CM | POA: Diagnosis not present

## 2013-08-24 DIAGNOSIS — M545 Low back pain, unspecified: Secondary | ICD-10-CM | POA: Diagnosis not present

## 2013-08-27 DIAGNOSIS — M545 Low back pain, unspecified: Secondary | ICD-10-CM | POA: Diagnosis not present

## 2013-08-28 DIAGNOSIS — M6740 Ganglion, unspecified site: Secondary | ICD-10-CM | POA: Diagnosis not present

## 2013-08-28 DIAGNOSIS — M8540 Solitary bone cyst, unspecified site: Secondary | ICD-10-CM | POA: Diagnosis not present

## 2013-08-28 DIAGNOSIS — D1610 Benign neoplasm of short bones of unspecified upper limb: Secondary | ICD-10-CM | POA: Diagnosis not present

## 2013-08-28 DIAGNOSIS — D2110 Benign neoplasm of connective and other soft tissue of unspecified upper limb, including shoulder: Secondary | ICD-10-CM | POA: Diagnosis not present

## 2013-09-07 DIAGNOSIS — M8540 Solitary bone cyst, unspecified site: Secondary | ICD-10-CM | POA: Diagnosis not present

## 2013-10-08 DIAGNOSIS — M8540 Solitary bone cyst, unspecified site: Secondary | ICD-10-CM | POA: Diagnosis not present

## 2013-10-18 HISTORY — PX: OTHER SURGICAL HISTORY: SHX169

## 2013-11-28 DIAGNOSIS — Z4789 Encounter for other orthopedic aftercare: Secondary | ICD-10-CM | POA: Diagnosis not present

## 2013-11-28 DIAGNOSIS — G5600 Carpal tunnel syndrome, unspecified upper limb: Secondary | ICD-10-CM | POA: Diagnosis not present

## 2013-12-26 DIAGNOSIS — Z4789 Encounter for other orthopedic aftercare: Secondary | ICD-10-CM | POA: Diagnosis not present

## 2013-12-26 DIAGNOSIS — G5600 Carpal tunnel syndrome, unspecified upper limb: Secondary | ICD-10-CM | POA: Diagnosis not present

## 2014-01-25 DIAGNOSIS — G5600 Carpal tunnel syndrome, unspecified upper limb: Secondary | ICD-10-CM | POA: Diagnosis not present

## 2014-01-28 DIAGNOSIS — Z23 Encounter for immunization: Secondary | ICD-10-CM | POA: Diagnosis not present

## 2014-01-28 DIAGNOSIS — L570 Actinic keratosis: Secondary | ICD-10-CM | POA: Diagnosis not present

## 2014-01-28 DIAGNOSIS — D235 Other benign neoplasm of skin of trunk: Secondary | ICD-10-CM | POA: Diagnosis not present

## 2014-04-09 DIAGNOSIS — Z1231 Encounter for screening mammogram for malignant neoplasm of breast: Secondary | ICD-10-CM | POA: Diagnosis not present

## 2014-04-24 DIAGNOSIS — J45909 Unspecified asthma, uncomplicated: Secondary | ICD-10-CM | POA: Diagnosis not present

## 2014-04-24 DIAGNOSIS — J309 Allergic rhinitis, unspecified: Secondary | ICD-10-CM | POA: Diagnosis not present

## 2014-05-14 DIAGNOSIS — J309 Allergic rhinitis, unspecified: Secondary | ICD-10-CM | POA: Diagnosis not present

## 2014-05-14 DIAGNOSIS — J45901 Unspecified asthma with (acute) exacerbation: Secondary | ICD-10-CM | POA: Diagnosis not present

## 2014-06-04 DIAGNOSIS — J309 Allergic rhinitis, unspecified: Secondary | ICD-10-CM | POA: Diagnosis not present

## 2014-06-04 DIAGNOSIS — J45909 Unspecified asthma, uncomplicated: Secondary | ICD-10-CM | POA: Diagnosis not present

## 2014-07-01 DIAGNOSIS — J309 Allergic rhinitis, unspecified: Secondary | ICD-10-CM | POA: Diagnosis not present

## 2014-07-04 DIAGNOSIS — M899 Disorder of bone, unspecified: Secondary | ICD-10-CM | POA: Diagnosis not present

## 2014-07-04 DIAGNOSIS — R7301 Impaired fasting glucose: Secondary | ICD-10-CM | POA: Diagnosis not present

## 2014-07-04 DIAGNOSIS — E039 Hypothyroidism, unspecified: Secondary | ICD-10-CM | POA: Diagnosis not present

## 2014-07-04 DIAGNOSIS — I10 Essential (primary) hypertension: Secondary | ICD-10-CM | POA: Diagnosis not present

## 2014-07-04 DIAGNOSIS — E785 Hyperlipidemia, unspecified: Secondary | ICD-10-CM | POA: Diagnosis not present

## 2014-07-09 DIAGNOSIS — J309 Allergic rhinitis, unspecified: Secondary | ICD-10-CM | POA: Diagnosis not present

## 2014-07-11 DIAGNOSIS — Z23 Encounter for immunization: Secondary | ICD-10-CM | POA: Diagnosis not present

## 2014-07-11 DIAGNOSIS — I712 Thoracic aortic aneurysm, without rupture, unspecified: Secondary | ICD-10-CM | POA: Diagnosis not present

## 2014-07-11 DIAGNOSIS — J45909 Unspecified asthma, uncomplicated: Secondary | ICD-10-CM | POA: Diagnosis not present

## 2014-07-11 DIAGNOSIS — R7301 Impaired fasting glucose: Secondary | ICD-10-CM | POA: Diagnosis not present

## 2014-07-11 DIAGNOSIS — E669 Obesity, unspecified: Secondary | ICD-10-CM | POA: Diagnosis not present

## 2014-07-11 DIAGNOSIS — Z0000 Encounter for general adult medical examination without abnormal findings: Secondary | ICD-10-CM | POA: Diagnosis not present

## 2014-07-11 DIAGNOSIS — Z1331 Encounter for screening for depression: Secondary | ICD-10-CM | POA: Diagnosis not present

## 2014-07-11 DIAGNOSIS — E785 Hyperlipidemia, unspecified: Secondary | ICD-10-CM | POA: Diagnosis not present

## 2014-07-11 DIAGNOSIS — I10 Essential (primary) hypertension: Secondary | ICD-10-CM | POA: Diagnosis not present

## 2014-07-11 DIAGNOSIS — E039 Hypothyroidism, unspecified: Secondary | ICD-10-CM | POA: Diagnosis not present

## 2014-07-12 ENCOUNTER — Ambulatory Visit: Payer: BLUE CROSS/BLUE SHIELD | Admitting: Gynecology

## 2014-07-19 DIAGNOSIS — J309 Allergic rhinitis, unspecified: Secondary | ICD-10-CM | POA: Diagnosis not present

## 2014-07-24 DIAGNOSIS — Z1212 Encounter for screening for malignant neoplasm of rectum: Secondary | ICD-10-CM | POA: Diagnosis not present

## 2014-07-29 DIAGNOSIS — L820 Inflamed seborrheic keratosis: Secondary | ICD-10-CM | POA: Diagnosis not present

## 2014-07-29 DIAGNOSIS — L570 Actinic keratosis: Secondary | ICD-10-CM | POA: Diagnosis not present

## 2014-07-30 DIAGNOSIS — J309 Allergic rhinitis, unspecified: Secondary | ICD-10-CM | POA: Diagnosis not present

## 2014-07-31 ENCOUNTER — Ambulatory Visit: Payer: BLUE CROSS/BLUE SHIELD | Admitting: Gynecology

## 2014-07-31 ENCOUNTER — Other Ambulatory Visit: Payer: Self-pay | Admitting: Obstetrics and Gynecology

## 2014-07-31 DIAGNOSIS — Z124 Encounter for screening for malignant neoplasm of cervix: Secondary | ICD-10-CM | POA: Diagnosis not present

## 2014-08-01 LAB — CYTOLOGY - PAP

## 2014-08-02 DIAGNOSIS — Z0289 Encounter for other administrative examinations: Secondary | ICD-10-CM

## 2014-08-06 DIAGNOSIS — Z0289 Encounter for other administrative examinations: Secondary | ICD-10-CM

## 2014-08-07 DIAGNOSIS — M8580 Other specified disorders of bone density and structure, unspecified site: Secondary | ICD-10-CM | POA: Diagnosis not present

## 2014-08-07 DIAGNOSIS — J309 Allergic rhinitis, unspecified: Secondary | ICD-10-CM | POA: Diagnosis not present

## 2014-08-07 DIAGNOSIS — M859 Disorder of bone density and structure, unspecified: Secondary | ICD-10-CM | POA: Diagnosis not present

## 2014-08-19 ENCOUNTER — Encounter: Payer: Self-pay | Admitting: Gynecology

## 2014-08-21 DIAGNOSIS — J309 Allergic rhinitis, unspecified: Secondary | ICD-10-CM | POA: Diagnosis not present

## 2014-09-05 DIAGNOSIS — J309 Allergic rhinitis, unspecified: Secondary | ICD-10-CM | POA: Diagnosis not present

## 2014-09-10 DIAGNOSIS — J309 Allergic rhinitis, unspecified: Secondary | ICD-10-CM | POA: Diagnosis not present

## 2014-09-18 DIAGNOSIS — J309 Allergic rhinitis, unspecified: Secondary | ICD-10-CM | POA: Diagnosis not present

## 2014-09-25 DIAGNOSIS — J309 Allergic rhinitis, unspecified: Secondary | ICD-10-CM | POA: Diagnosis not present

## 2014-10-03 DIAGNOSIS — J309 Allergic rhinitis, unspecified: Secondary | ICD-10-CM | POA: Diagnosis not present

## 2014-10-09 DIAGNOSIS — J309 Allergic rhinitis, unspecified: Secondary | ICD-10-CM | POA: Diagnosis not present

## 2014-10-16 DIAGNOSIS — J309 Allergic rhinitis, unspecified: Secondary | ICD-10-CM | POA: Diagnosis not present

## 2014-10-22 DIAGNOSIS — J309 Allergic rhinitis, unspecified: Secondary | ICD-10-CM | POA: Diagnosis not present

## 2014-10-31 DIAGNOSIS — J309 Allergic rhinitis, unspecified: Secondary | ICD-10-CM | POA: Diagnosis not present

## 2014-11-19 ENCOUNTER — Ambulatory Visit (INDEPENDENT_AMBULATORY_CARE_PROVIDER_SITE_OTHER): Payer: Medicare Other

## 2014-11-19 VITALS — BP 112/64 | HR 76 | Resp 12

## 2014-11-19 DIAGNOSIS — G5762 Lesion of plantar nerve, left lower limb: Secondary | ICD-10-CM

## 2014-11-19 DIAGNOSIS — Q828 Other specified congenital malformations of skin: Secondary | ICD-10-CM | POA: Diagnosis not present

## 2014-11-19 DIAGNOSIS — M2042 Other hammer toe(s) (acquired), left foot: Secondary | ICD-10-CM | POA: Diagnosis not present

## 2014-11-19 DIAGNOSIS — J309 Allergic rhinitis, unspecified: Secondary | ICD-10-CM | POA: Diagnosis not present

## 2014-11-19 DIAGNOSIS — M79672 Pain in left foot: Secondary | ICD-10-CM

## 2014-11-19 DIAGNOSIS — M778 Other enthesopathies, not elsewhere classified: Secondary | ICD-10-CM

## 2014-11-19 DIAGNOSIS — M7752 Other enthesopathy of left foot: Secondary | ICD-10-CM

## 2014-11-19 DIAGNOSIS — M779 Enthesopathy, unspecified: Principal | ICD-10-CM

## 2014-11-19 NOTE — Patient Instructions (Signed)
Morton's Neuroma in Sports  (Interdigital Plantar Neuroma) Morton's neuroma is a condition of the nervous system that results in pain or loss of feeling in the toes. The disease is caused by the bones of the foot squeezing the nerve that runs between two toes (interdigital nerve). The third and fourth toes are most likely to be affected by this disease. SYMPTOMS   Tingling, numbness, burning, or electric shocks in the front of the foot, often involving the third and fourth toes, although it may involve any other pair of toes.  Pain and tenderness in the front of the foot, that gets worse when walking.  Pain that gets worse when pressure is applied to the foot (wearing shoes).  Severe pain in the front of the foot, when standing on the front of the foot (on tiptoes), such as with running, jumping, pivoting, or dancing. CAUSES  Morton's neuroma is caused by swelling of the nerve between two toes. This swelling causes the nerve to be pinched between the bones of the foot. RISK INCREASES WITH:  Recurring foot or ankle injuries.  Poor fitting or worn shoes, with minimal padding and shock absorbers.  Loose ligaments of the foot, causing thickening of the nerve.  Poor foot strength and flexibility. PREVENTION  Warm up and stretch properly before activity.  Maintain physical fitness:  Foot and ankle flexibility.  Muscle strength and endurance.  Cardiovascular fitness.  Wear properly fitted and padded shoes.  Wear arch supports (orthotics), when needed. PROGNOSIS  If treated properly, Morton's neuroma can usually be cured with non-surgical treatment. For certain cases, surgery may be needed. RELATED COMPLICATIONS  Permanent numbness and pain in the foot.  Inability to participate in athletics, because of pain. TREATMENT Treatment first involves stopping any activities that make the symptoms worse. The use of ice and medicine will help reduce pain and inflammation. Wearing shoes  with a wide toe box, and an orthotic arch support or metatarsal bar, may also reduce pain. Your caregiver may give you a corticosteroid injection, to further reduce inflammation. If non-surgical treatment is unsuccessful, surgery may be needed. Surgery to fix Morton's neuroma is often performed as an outpatient procedure, meaning you can go home the same day as the surgery. The procedure involves removing the source of pressure on the nerve. If it is necessary to remove the nerve, you can expect persistent numbness. MEDICATION  If pain medicine is needed, nonsteroidal anti-inflammatory medicines (aspirin and ibuprofen), or other minor pain relievers (acetaminophen), are often advised.  Do not take pain medicine for 7 days before surgery.  Prescription pain relievers are usually prescribed only after surgery. Use only as directed and only as much as you need.  Corticosteroid injections are used in extreme cases, to reduce inflammation. These injections should be done only if necessary, because they may be given only a limited number of times. HEAT AND COLD  Cold treatment (icing) should be applied for 10 to 15 minutes every 2 to 3 hours for inflammation and pain, and immediately after activity that aggravates your symptoms. Use ice packs or an ice massage.  Heat treatment may be used before performing stretching and strengthening activities prescribed by your caregiver, physical therapist, or athletic trainer. Use a heat pack or a warm water soak. SEEK MEDICAL CARE IF:   Symptoms get worse or do not improve in 2 weeks, despite treatment.  After surgery you develop increasing pain, swelling, redness, increased warmth, bleeding, drainage of fluids, or fever.  New, unexplained symptoms develop. (  Drugs used in treatment may produce side effects.) Document Released: 08/11/2005 Document Revised: 12/27/2011 Document Reviewed: 01/16/2009 [employer] Patient Information 2015 [place], [place]. This  information is not intended to replace advice given to you by your health care provider. Make sure you discuss any questions you have with your health care provider.    ICE INSTRUCTIONS  Apply ice or cold pack to the affected area at least 3 times a day for 10-15 minutes each time.  You should also use ice after prolonged activity or vigorous exercise.  Do not apply ice longer than 20 minutes at one time.  Always keep a cloth between your skin and the ice pack to prevent burns.  Being consistent and following these instructions will help control your symptoms.  We suggest you purchase a gel ice pack because they are reusable and do bit leak.  Some of them are designed to wrap around the area.  Use the method that works best for you.  Here are some other suggestions for icing.   Use a frozen bag of peas or corn-inexpensive and molds well to your body, usually stays frozen for 10 to 20 minutes.  Wet a towel with cold water and squeeze out the excess until it's damp.  Place in a bag in the freezer for 20 minutes. Then remove and use.

## 2014-11-19 NOTE — Progress Notes (Signed)
   Subjective:    Patient ID: Emily Maxwell, female    DOB: 06-Sep-1941, 74 y.o.   MRN: [id number]  HPI  PT STATED LT BALL OF THE FOOT HAVE CALLUS AND BEEN HURTING FOR 3 YEARS. THE CALLUS IS GETTING THICKER AND GET AGGRAVATED BY PRESSURE. TRIED TO KEEP TRIM AND WEAR THE GEL PAD AND IT HELP SOME.  Review of Systems  HENT: Positive for hearing loss and sinus pressure.   Endocrine: Positive for heat intolerance.  Musculoskeletal: Positive for joint swelling and gait problem.  Allergic/Immunologic: Positive for immunocompromised state.  All other systems reviewed and are negative.      Objective:   Physical Exam 74 year old white female well-developed well-nourished oriented 3 presents at this time with left foot pain has a complaint of what she describes as calcium left foot has had previous surgeries on the left foot by Dr. Cheryll Dessert some other doctors has had bunion surgery in the past many years ago and several years back and neurectomy third interspace left foot. At this time describing some numbness in her third interspace definite pain in the ball the foot on ambulation walking and with tighter narrower shoes. I padding gel insoles also self debridement and calluses riding temporary relief. Lotion objective findings reveal vascular status to be intact pedal pulses are palpable DP and PT +2 over 4 Refill time 3 seconds all digits epicritic and proprioceptive sensations intact and symmetric bilateral there is normal plantar response DTRs not elicited dermatologically skin color pigment normal hair growth absent nails somewhat criptotic there is keratoses sub-second and sub-fourth metatarsal area or at least in the first medial to the second metatarsal and lateral to the third metatarsal area. However on palpation her focal pain is actually in the second interspace not under the keratotic lesions. There is pain on direct lateral compression of the second intermetatarsal space with distal radiation of  pain positive Mulder sign consistent with possible early neuroma. No x-rays taken at this time however there is no pain on palpation metatarsals good range of motion of the digits no restrictions noted semirigid contractures the hammertoes are noted.       Assessment & Plan:  Assessment based on history and clinical exam is out of the early Morton's neuroma versus capsulitis second MTP her second interspace left foot. Plan at this time maintain Y coming shoes injection tender with Kenalog 20 mg Xylocaine plain therapeutic nerve block is given to the second intermetatarsal neuroma. Patient provided almost really immediate relief following the injection advised to apply ice to the area and Tylenol as needed for pain recheck in a month if fails to improve or resolve her stay resolved discussed the possibility of a series of alcohol injections for permanent sclerosing of the nerve if it persists. Patient will maintain appropriate shoes at all times no barefoot no flimsy shoes or flip-flops    [name] DPM

## 2014-11-27 DIAGNOSIS — J309 Allergic rhinitis, unspecified: Secondary | ICD-10-CM | POA: Diagnosis not present

## 2014-12-06 DIAGNOSIS — J309 Allergic rhinitis, unspecified: Secondary | ICD-10-CM | POA: Diagnosis not present

## 2014-12-12 DIAGNOSIS — J309 Allergic rhinitis, unspecified: Secondary | ICD-10-CM | POA: Diagnosis not present

## 2014-12-17 ENCOUNTER — Ambulatory Visit: Payer: Medicare Other

## 2014-12-23 DIAGNOSIS — J309 Allergic rhinitis, unspecified: Secondary | ICD-10-CM | POA: Diagnosis not present

## 2015-01-15 DIAGNOSIS — J309 Allergic rhinitis, unspecified: Secondary | ICD-10-CM | POA: Diagnosis not present

## 2015-01-22 DIAGNOSIS — J309 Allergic rhinitis, unspecified: Secondary | ICD-10-CM | POA: Diagnosis not present

## 2015-01-29 DIAGNOSIS — J309 Allergic rhinitis, unspecified: Secondary | ICD-10-CM | POA: Diagnosis not present

## 2015-01-29 DIAGNOSIS — L820 Inflamed seborrheic keratosis: Secondary | ICD-10-CM | POA: Diagnosis not present

## 2015-01-29 DIAGNOSIS — L570 Actinic keratosis: Secondary | ICD-10-CM | POA: Diagnosis not present

## 2015-02-04 DIAGNOSIS — J309 Allergic rhinitis, unspecified: Secondary | ICD-10-CM | POA: Diagnosis not present

## 2015-02-28 DIAGNOSIS — J309 Allergic rhinitis, unspecified: Secondary | ICD-10-CM | POA: Diagnosis not present

## 2015-03-06 DIAGNOSIS — J309 Allergic rhinitis, unspecified: Secondary | ICD-10-CM | POA: Diagnosis not present

## 2015-03-13 DIAGNOSIS — J309 Allergic rhinitis, unspecified: Secondary | ICD-10-CM | POA: Diagnosis not present

## 2015-03-19 DIAGNOSIS — J309 Allergic rhinitis, unspecified: Secondary | ICD-10-CM | POA: Diagnosis not present

## 2015-03-27 DIAGNOSIS — J309 Allergic rhinitis, unspecified: Secondary | ICD-10-CM | POA: Diagnosis not present

## 2015-04-04 DIAGNOSIS — J309 Allergic rhinitis, unspecified: Secondary | ICD-10-CM | POA: Diagnosis not present

## 2015-04-09 DIAGNOSIS — J309 Allergic rhinitis, unspecified: Secondary | ICD-10-CM | POA: Diagnosis not present

## 2015-04-16 DIAGNOSIS — J309 Allergic rhinitis, unspecified: Secondary | ICD-10-CM | POA: Diagnosis not present

## 2015-04-17 DIAGNOSIS — J309 Allergic rhinitis, unspecified: Secondary | ICD-10-CM | POA: Diagnosis not present

## 2015-04-24 DIAGNOSIS — J309 Allergic rhinitis, unspecified: Secondary | ICD-10-CM | POA: Diagnosis not present

## 2015-04-30 DIAGNOSIS — J309 Allergic rhinitis, unspecified: Secondary | ICD-10-CM | POA: Diagnosis not present

## 2015-05-06 DIAGNOSIS — J309 Allergic rhinitis, unspecified: Secondary | ICD-10-CM | POA: Diagnosis not present

## 2015-05-15 DIAGNOSIS — J309 Allergic rhinitis, unspecified: Secondary | ICD-10-CM | POA: Diagnosis not present

## 2015-05-27 DIAGNOSIS — J309 Allergic rhinitis, unspecified: Secondary | ICD-10-CM | POA: Diagnosis not present

## 2015-06-02 DIAGNOSIS — N390 Urinary tract infection, site not specified: Secondary | ICD-10-CM | POA: Diagnosis not present

## 2015-06-02 DIAGNOSIS — R8299 Other abnormal findings in urine: Secondary | ICD-10-CM | POA: Diagnosis not present

## 2015-06-04 DIAGNOSIS — J309 Allergic rhinitis, unspecified: Secondary | ICD-10-CM | POA: Diagnosis not present

## 2015-06-19 DIAGNOSIS — J309 Allergic rhinitis, unspecified: Secondary | ICD-10-CM | POA: Diagnosis not present

## 2015-06-25 DIAGNOSIS — J309 Allergic rhinitis, unspecified: Secondary | ICD-10-CM | POA: Diagnosis not present

## 2015-06-28 DIAGNOSIS — J3089 Other allergic rhinitis: Secondary | ICD-10-CM | POA: Insufficient documentation

## 2015-06-28 DIAGNOSIS — K219 Gastro-esophageal reflux disease without esophagitis: Secondary | ICD-10-CM

## 2015-07-02 DIAGNOSIS — J309 Allergic rhinitis, unspecified: Secondary | ICD-10-CM | POA: Diagnosis not present

## 2015-07-08 DIAGNOSIS — J309 Allergic rhinitis, unspecified: Secondary | ICD-10-CM | POA: Diagnosis not present

## 2015-07-10 DIAGNOSIS — R7301 Impaired fasting glucose: Secondary | ICD-10-CM | POA: Diagnosis not present

## 2015-07-10 DIAGNOSIS — E785 Hyperlipidemia, unspecified: Secondary | ICD-10-CM | POA: Diagnosis not present

## 2015-07-10 DIAGNOSIS — E039 Hypothyroidism, unspecified: Secondary | ICD-10-CM | POA: Diagnosis not present

## 2015-07-10 DIAGNOSIS — R8290 Unspecified abnormal findings in urine: Secondary | ICD-10-CM | POA: Diagnosis not present

## 2015-07-10 DIAGNOSIS — I10 Essential (primary) hypertension: Secondary | ICD-10-CM | POA: Diagnosis not present

## 2015-07-10 DIAGNOSIS — N390 Urinary tract infection, site not specified: Secondary | ICD-10-CM | POA: Diagnosis not present

## 2015-07-10 DIAGNOSIS — M859 Disorder of bone density and structure, unspecified: Secondary | ICD-10-CM | POA: Diagnosis not present

## 2015-07-15 ENCOUNTER — Ambulatory Visit (INDEPENDENT_AMBULATORY_CARE_PROVIDER_SITE_OTHER): Payer: BLUE CROSS/BLUE SHIELD

## 2015-07-15 DIAGNOSIS — J3089 Other allergic rhinitis: Secondary | ICD-10-CM

## 2015-07-15 MED ORDER — EPINEPHRINE 0.3 MG/0.3ML IJ SOAJ: 0.3000 mg | INTRAMUSCULAR | Status: DC

## 2015-07-17 DIAGNOSIS — E669 Obesity, unspecified: Secondary | ICD-10-CM | POA: Diagnosis not present

## 2015-07-17 DIAGNOSIS — Z23 Encounter for immunization: Secondary | ICD-10-CM | POA: Diagnosis not present

## 2015-07-17 DIAGNOSIS — I10 Essential (primary) hypertension: Secondary | ICD-10-CM | POA: Diagnosis not present

## 2015-07-17 DIAGNOSIS — E785 Hyperlipidemia, unspecified: Secondary | ICD-10-CM | POA: Diagnosis not present

## 2015-07-17 DIAGNOSIS — J45909 Unspecified asthma, uncomplicated: Secondary | ICD-10-CM | POA: Diagnosis not present

## 2015-07-17 DIAGNOSIS — Z0000 Encounter for general adult medical examination without abnormal findings: Secondary | ICD-10-CM | POA: Diagnosis not present

## 2015-07-17 DIAGNOSIS — I712 Thoracic aortic aneurysm, without rupture: Secondary | ICD-10-CM | POA: Diagnosis not present

## 2015-07-17 DIAGNOSIS — Z1389 Encounter for screening for other disorder: Secondary | ICD-10-CM | POA: Diagnosis not present

## 2015-07-17 DIAGNOSIS — E039 Hypothyroidism, unspecified: Secondary | ICD-10-CM | POA: Diagnosis not present

## 2015-07-17 DIAGNOSIS — R7301 Impaired fasting glucose: Secondary | ICD-10-CM | POA: Diagnosis not present

## 2015-07-17 DIAGNOSIS — R310 Gross hematuria: Secondary | ICD-10-CM | POA: Diagnosis not present

## 2015-07-17 DIAGNOSIS — Z6833 Body mass index (BMI) 33.0-33.9, adult: Secondary | ICD-10-CM | POA: Diagnosis not present

## 2015-07-23 ENCOUNTER — Ambulatory Visit (INDEPENDENT_AMBULATORY_CARE_PROVIDER_SITE_OTHER): Payer: BLUE CROSS/BLUE SHIELD

## 2015-07-23 DIAGNOSIS — Z1212 Encounter for screening for malignant neoplasm of rectum: Secondary | ICD-10-CM | POA: Diagnosis not present

## 2015-07-23 DIAGNOSIS — J301 Allergic rhinitis due to pollen: Secondary | ICD-10-CM | POA: Diagnosis not present

## 2015-07-24 DIAGNOSIS — J3089 Other allergic rhinitis: Secondary | ICD-10-CM | POA: Diagnosis not present

## 2015-07-30 DIAGNOSIS — L570 Actinic keratosis: Secondary | ICD-10-CM | POA: Diagnosis not present

## 2015-07-30 DIAGNOSIS — L820 Inflamed seborrheic keratosis: Secondary | ICD-10-CM | POA: Diagnosis not present

## 2015-07-31 DIAGNOSIS — H35372 Puckering of macula, left eye: Secondary | ICD-10-CM | POA: Diagnosis not present

## 2015-08-01 DIAGNOSIS — R3129 Other microscopic hematuria: Secondary | ICD-10-CM | POA: Diagnosis not present

## 2015-08-01 DIAGNOSIS — R310 Gross hematuria: Secondary | ICD-10-CM | POA: Diagnosis not present

## 2015-08-01 DIAGNOSIS — R351 Nocturia: Secondary | ICD-10-CM | POA: Diagnosis not present

## 2015-08-01 DIAGNOSIS — N3020 Other chronic cystitis without hematuria: Secondary | ICD-10-CM | POA: Diagnosis not present

## 2015-08-04 ENCOUNTER — Other Ambulatory Visit: Payer: Self-pay | Admitting: Urology

## 2015-08-04 ENCOUNTER — Encounter (HOSPITAL_COMMUNITY): Payer: Self-pay | Admitting: *Deleted

## 2015-08-04 DIAGNOSIS — R310 Gross hematuria: Secondary | ICD-10-CM | POA: Diagnosis not present

## 2015-08-04 DIAGNOSIS — C652 Malignant neoplasm of left renal pelvis: Secondary | ICD-10-CM | POA: Diagnosis not present

## 2015-08-05 ENCOUNTER — Encounter (HOSPITAL_COMMUNITY): Payer: Self-pay | Admitting: *Deleted

## 2015-08-05 NOTE — Progress Notes (Signed)
Requested LOV note , most recent EKG and most recent Aneurysm Scan ( ? Last one ? 2015 per patient ) from office of Dr Marton Redwood. [phone]).

## 2015-08-05 NOTE — Progress Notes (Signed)
Requested most recent scan done on aorta and aneurysm done ? 06/2014.  Requested results to be faxed.

## 2015-08-06 NOTE — Progress Notes (Signed)
Dr Gifford Shave ( anesthesia ) made aware of patient's history of thoracic aneurysm.  Anesthesia saw last office visit note - Dr Servando Snare- 2013 with his recommendations.  And patient last saw PCp- 07/2015.  Patient's last scan was 06/2014.  No new orders given.

## 2015-08-07 ENCOUNTER — Ambulatory Visit (HOSPITAL_COMMUNITY): Payer: Medicare Other | Admitting: Registered Nurse

## 2015-08-07 ENCOUNTER — Ambulatory Visit (HOSPITAL_COMMUNITY)
Admission: RE | Admit: 2015-08-07 | Discharge: 2015-08-07 | Disposition: A | Discharge status: Home / Self Care | Payer: Medicare Other | Source: Ambulatory Visit | Attending: Urology | Admitting: Urology

## 2015-08-07 ENCOUNTER — Encounter (HOSPITAL_COMMUNITY)
Admission: RE | Disposition: A | Discharge status: Home / Self Care | Payer: Self-pay | Source: Ambulatory Visit | Attending: Urology

## 2015-08-07 ENCOUNTER — Encounter (HOSPITAL_COMMUNITY): Payer: Self-pay | Admitting: *Deleted

## 2015-08-07 DIAGNOSIS — J45909 Unspecified asthma, uncomplicated: Secondary | ICD-10-CM | POA: Insufficient documentation

## 2015-08-07 DIAGNOSIS — I10 Essential (primary) hypertension: Secondary | ICD-10-CM | POA: Insufficient documentation

## 2015-08-07 DIAGNOSIS — E785 Hyperlipidemia, unspecified: Secondary | ICD-10-CM | POA: Diagnosis not present

## 2015-08-07 DIAGNOSIS — E669 Obesity, unspecified: Secondary | ICD-10-CM | POA: Insufficient documentation

## 2015-08-07 DIAGNOSIS — C652 Malignant neoplasm of left renal pelvis: Secondary | ICD-10-CM | POA: Diagnosis not present

## 2015-08-07 DIAGNOSIS — M1990 Unspecified osteoarthritis, unspecified site: Secondary | ICD-10-CM | POA: Diagnosis not present

## 2015-08-07 DIAGNOSIS — E039 Hypothyroidism, unspecified: Secondary | ICD-10-CM | POA: Insufficient documentation

## 2015-08-07 DIAGNOSIS — Z6832 Body mass index (BMI) 32.0-32.9, adult: Secondary | ICD-10-CM | POA: Insufficient documentation

## 2015-08-07 DIAGNOSIS — I712 Thoracic aortic aneurysm, without rupture: Secondary | ICD-10-CM | POA: Diagnosis not present

## 2015-08-07 DIAGNOSIS — I739 Peripheral vascular disease, unspecified: Secondary | ICD-10-CM | POA: Insufficient documentation

## 2015-08-07 DIAGNOSIS — R1900 Intra-abdominal and pelvic swelling, mass and lump, unspecified site: Secondary | ICD-10-CM | POA: Diagnosis present

## 2015-08-07 DIAGNOSIS — N3289 Other specified disorders of bladder: Secondary | ICD-10-CM | POA: Diagnosis not present

## 2015-08-07 HISTORY — PX: CYSTOSCOPY WITH RETROGRADE PYELOGRAM, URETEROSCOPY AND STENT PLACEMENT: SHX5789

## 2015-08-07 HISTORY — DX: Thoracic aortic aneurysm, without rupture: I71.2

## 2015-08-07 HISTORY — DX: Anxiety disorder, unspecified: F41.9

## 2015-08-07 HISTORY — DX: Syncope and collapse: R55

## 2015-08-07 HISTORY — DX: Unspecified osteoarthritis, unspecified site: M19.90

## 2015-08-07 HISTORY — DX: Other complications of anesthesia, initial encounter: T88.59XA

## 2015-08-07 HISTORY — DX: Thoracic aortic aneurysm, without rupture, unspecified: I71.20

## 2015-08-07 HISTORY — DX: Adverse effect of unspecified anesthetic, initial encounter: T41.45XA

## 2015-08-07 LAB — GLUCOSE, CAPILLARY: GLUCOSE-CAPILLARY: 105 mg/dL — AB (ref 65–99)

## 2015-08-07 LAB — BASIC METABOLIC PANEL
Anion gap: 7 (ref 5–15)
BUN: 18 mg/dL (ref 6–20)
CALCIUM: 9.2 mg/dL (ref 8.9–10.3)
CO2: 26 mmol/L (ref 22–32)
CREATININE: 0.81 mg/dL (ref 0.44–1.00)
Chloride: 107 mmol/L (ref 101–111)
GFR calc Af Amer: >60 mL/min (ref >60)
Glucose, Bld: 113 mg/dL — ABNORMAL HIGH (ref 65–99)
POTASSIUM: 4.1 mmol/L (ref 3.5–5.1)
SODIUM: 140 mmol/L (ref 135–145)

## 2015-08-07 LAB — CBC
HCT: 38.8 % (ref 36.0–46.0)
Hemoglobin: 13.5 g/dL (ref 12.0–15.0)
MCH: 32.1 pg (ref 26.0–34.0)
MCHC: 34.8 g/dL (ref 30.0–36.0)
MCV: 92.2 fL (ref 78.0–100.0)
PLATELETS: 238 10*3/uL (ref 150–400)
RBC: 4.21 MIL/uL (ref 3.87–5.11)
RDW: 12 % (ref 11.5–15.5)
WBC: 3.9 10*3/uL — ABNORMAL LOW (ref 4.0–10.5)

## 2015-08-07 SURGERY — CYSTOURETEROSCOPY, WITH RETROGRADE PYELOGRAM AND STENT INSERTION
Anesthesia: General | Laterality: Bilateral

## 2015-08-07 MED ORDER — FENTANYL CITRATE (PF) 100 MCG/2ML IJ SOLN
INTRAMUSCULAR | Status: DC | PRN
  Administered 2015-08-07: 50 ug via INTRAVENOUS
  Administered 2015-08-07: 25 ug via INTRAVENOUS
  Administered 2015-08-07: 75 ug via INTRAVENOUS
  Administered 2015-08-07: 50 ug via INTRAVENOUS

## 2015-08-07 MED ORDER — DEXAMETHASONE SODIUM PHOSPHATE 10 MG/ML IJ SOLN
INTRAMUSCULAR | Status: AC
  Filled 2015-08-07: qty 1

## 2015-08-07 MED ORDER — FENTANYL CITRATE (PF) 100 MCG/2ML IJ SOLN
25.0000 ug | INTRAMUSCULAR | Status: DC | PRN
  Administered 2015-08-07 (×2): 50 ug via INTRAVENOUS

## 2015-08-07 MED ORDER — ONDANSETRON HCL 4 MG/2ML IJ SOLN
INTRAMUSCULAR | Status: DC | PRN
  Administered 2015-08-07: 4 mg via INTRAVENOUS

## 2015-08-07 MED ORDER — FENTANYL CITRATE (PF) 100 MCG/2ML IJ SOLN
INTRAMUSCULAR | Status: AC
  Filled 2015-08-07: qty 2

## 2015-08-07 MED ORDER — DEXTROSE 5 % IV SOLN
340.0000 mg | Freq: Once | INTRAVENOUS | Status: AC
  Administered 2015-08-07: 340 mg via INTRAVENOUS
  Filled 2015-08-07: qty 8.5

## 2015-08-07 MED ORDER — PROPOFOL 10 MG/ML IV BOLUS
INTRAVENOUS | Status: DC | PRN
  Administered 2015-08-07: 200 mg via INTRAVENOUS

## 2015-08-07 MED ORDER — IOHEXOL 300 MG/ML  SOLN
INTRAMUSCULAR | Status: DC | PRN
  Administered 2015-08-07: 20 mL

## 2015-08-07 MED ORDER — SODIUM CHLORIDE 0.9 % IV SOLN
4.0000 mg | Freq: Once | INTRAVENOUS | Status: DC
  Filled 2015-08-07: qty 2

## 2015-08-07 MED ORDER — GENTAMICIN IN SALINE 1.6-0.9 MG/ML-% IV SOLN: 80.0000 mg | INTRAVENOUS | Status: DC

## 2015-08-07 MED ORDER — MIDAZOLAM HCL 2 MG/2ML IJ SOLN
INTRAMUSCULAR | Status: AC
  Filled 2015-08-07: qty 2

## 2015-08-07 MED ORDER — OXYCODONE-ACETAMINOPHEN 5-325 MG PO TABS
1.0000 | ORAL_TABLET | Freq: Once | ORAL | Status: AC
  Administered 2015-08-07: 1 via ORAL
  Filled 2015-08-07: qty 1

## 2015-08-07 MED ORDER — LACTATED RINGERS IV SOLN
INTRAVENOUS | Status: DC
  Administered 2015-08-07: 1000 mL via INTRAVENOUS

## 2015-08-07 MED ORDER — SODIUM CHLORIDE 0.9 % IR SOLN
Status: DC | PRN
  Administered 2015-08-07: 4000 mL

## 2015-08-07 MED ORDER — LACTATED RINGERS IV SOLN
INTRAVENOUS | Status: DC | PRN
  Administered 2015-08-07: 13:00:00 via INTRAVENOUS

## 2015-08-07 MED ORDER — SENNOSIDES-DOCUSATE SODIUM 8.6-50 MG PO TABS: 1.0000 | ORAL_TABLET | Freq: Two times a day (BID) | ORAL | Status: DC

## 2015-08-07 MED ORDER — PROPOFOL 10 MG/ML IV BOLUS
INTRAVENOUS | Status: AC
  Filled 2015-08-07: qty 20

## 2015-08-07 MED ORDER — LIDOCAINE HCL (CARDIAC) 10 MG/ML IV SOLN
INTRAVENOUS | Status: DC | PRN
  Administered 2015-08-07: 100 mg via INTRAVENOUS

## 2015-08-07 MED ORDER — LIDOCAINE HCL (CARDIAC) 20 MG/ML IV SOLN
INTRAVENOUS | Status: AC
  Filled 2015-08-07: qty 5

## 2015-08-07 MED ORDER — MIDAZOLAM HCL 5 MG/5ML IJ SOLN
INTRAMUSCULAR | Status: DC | PRN
  Administered 2015-08-07: 2 mg via INTRAVENOUS

## 2015-08-07 MED ORDER — DEXAMETHASONE SODIUM PHOSPHATE 10 MG/ML IJ SOLN
INTRAMUSCULAR | Status: DC | PRN
  Administered 2015-08-07: 10 mg via INTRAVENOUS

## 2015-08-07 MED ORDER — ONDANSETRON HCL 4 MG/2ML IJ SOLN
4.0000 mg | Freq: Four times a day (QID) | INTRAMUSCULAR | Status: AC | PRN
  Administered 2015-08-07: 4 mg via INTRAVENOUS
  Filled 2015-08-07: qty 2

## 2015-08-07 MED ORDER — ONDANSETRON HCL 4 MG/2ML IJ SOLN
INTRAMUSCULAR | Status: AC
  Filled 2015-08-07: qty 2

## 2015-08-07 MED ORDER — FENTANYL CITRATE (PF) 250 MCG/5ML IJ SOLN
INTRAMUSCULAR | Status: AC
  Filled 2015-08-07: qty 25

## 2015-08-07 MED ORDER — OXYCODONE-ACETAMINOPHEN 5-325 MG PO TABS: 1.0000 | ORAL_TABLET | Freq: Four times a day (QID) | ORAL | Status: DC | PRN

## 2015-08-07 SURGICAL SUPPLY — 17 items
BASKET LASER NITINOL 1.9FR (BASKET) ×2 IMPLANT
BASKET STNLS GEMINI 4WIRE 3FR (BASKET) IMPLANT
BASKET ZERO TIP NITINOL 2.4FR (BASKET) IMPLANT
CATH INTERMIT  6FR 70CM (CATHETERS) ×2 IMPLANT
CLOTH BEACON ORANGE TIMEOUT ST (SAFETY) ×2 IMPLANT
FORCEPS BIOP 2.4F 115CM BACKLD (INSTRUMENTS) ×2 IMPLANT
GLOVE BIOGEL M STRL SZ7.5 (GLOVE) ×2 IMPLANT
GOWN STRL REUS W/TWL XL LVL3 (GOWN DISPOSABLE) ×2 IMPLANT
GUIDEWIRE ANG ZIPWIRE 038X150 (WIRE) IMPLANT
GUIDEWIRE STR DUAL SENSOR (WIRE) ×2 IMPLANT
NDL SAFETY ECLIPSE 18X1.5 (NEEDLE) ×1 IMPLANT
NEEDLE HYPO 18GX1.5 SHARP (NEEDLE) ×1
PACK CYSTO (CUSTOM PROCEDURE TRAY) ×2 IMPLANT
SHEATH ACCESS URETERAL 24CM (SHEATH) ×2 IMPLANT
STENT URET 6FRX24 CONTOUR (STENTS) ×2 IMPLANT
SYRINGE 10CC LL (SYRINGE) IMPLANT
TUBE FEEDING 8FR 16IN STR KANG (MISCELLANEOUS) ×2 IMPLANT

## 2015-08-07 NOTE — Anesthesia Postprocedure Evaluation (Signed)
Anesthesia Post Note  Patient: Emily Maxwell  Procedure(s) Performed: Procedure(s) (LRB): CYSTOSCOPY WITH BILATERAL RETROGRADE PYELOGRAM, LEFT URETEROSCOPY WITH BIOPSY AND  LEFT STENT PLACEMENT (Bilateral)  Anesthesia type: General  Patient location: PACU  Post pain: Pain level controlled and Adequate analgesia  Post assessment: Post-op Vital signs reviewed, Patient's Cardiovascular Status Stable, Respiratory Function Stable, Patent Airway and Pain level controlled  Last Vitals:  Filed Vitals:   08/07/15 1457  BP: 101/47  Pulse: 55  Temp: 36.7 C  Resp: 15    Post vital signs: Reviewed and stable  Level of consciousness: awake, alert  and oriented  Complications: No apparent anesthesia complications

## 2015-08-07 NOTE — Anesthesia Procedure Notes (Signed)
Procedure Name: LMA Insertion Date/Time: 08/07/2015 1:05 PM Performed by: Enrigue Catena E Pre-anesthesia Checklist: Patient identified, Emergency Drugs available, Suction available and Patient being monitored Patient Re-evaluated:Patient Re-evaluated prior to inductionOxygen Delivery Method: Circle System Utilized Preoxygenation: Pre-oxygenation with 100% oxygen Intubation Type: IV induction Ventilation: Mask ventilation without difficulty LMA: LMA with gastric port inserted LMA Size: 4.0 Tube type: Oral Number of attempts: 1 Placement Confirmation: positive ETCO2 Tube secured with: Tape Dental Injury: Teeth and Oropharynx as per pre-operative assessment

## 2015-08-07 NOTE — Transfer of Care (Signed)
Immediate Anesthesia Transfer of Care Note  Patient: Agatha Duplechain  Procedure(s) Performed: Procedure(s): CYSTOSCOPY WITH BILATERAL RETROGRADE PYELOGRAM, LEFT URETEROSCOPY WITH BIOPSY AND  LEFT STENT PLACEMENT (Bilateral)  Patient Location: PACU  Anesthesia Type:General  Level of Consciousness: awake, alert , oriented and patient cooperative  Airway & Oxygen Therapy: Patient Spontanous Breathing and Patient connected to face mask oxygen  Post-op Assessment: Report given to RN, Post -op Vital signs reviewed and stable and Patient moving all extremities X 4  Post vital signs: stable  Last Vitals:  Filed Vitals:   08/07/15 1053  BP: 135/74  Pulse: 80  Temp: 36.8 C  Resp: 16    Complications: No apparent anesthesia complications

## 2015-08-07 NOTE — Anesthesia Preprocedure Evaluation (Signed)
Anesthesia Evaluation  Patient identified by MRN, date of birth, ID band Patient awake    Reviewed: Allergy & Precautions, NPO status , Patient's Chart, lab work & pertinent test results  Airway Mallampati: II   Neck ROM: full    Dental   Pulmonary asthma ,    breath sounds clear to auscultation       Cardiovascular hypertension, + Peripheral Vascular Disease   Rhythm:regular Rate:Normal     Neuro/Psych Anxiety    GI/Hepatic   Endo/Other  Hypothyroidism obese  Renal/GU      Musculoskeletal  (+) Arthritis ,   Abdominal   Peds  Hematology   Anesthesia Other Findings   Reproductive/Obstetrics                             Anesthesia Physical Anesthesia Plan  ASA: III  Anesthesia Plan: General   Post-op Pain Management:    Induction: Intravenous  Airway Management Planned: LMA  Additional Equipment:   Intra-op Plan:   Post-operative Plan:   Informed Consent: I have reviewed the patients History and Physical, chart, labs and discussed the procedure including the risks, benefits and alternatives for the proposed anesthesia with the patient or authorized representative who has indicated his/her understanding and acceptance.     Plan Discussed with: CRNA, Anesthesiologist and Surgeon  Anesthesia Plan Comments:         Anesthesia Quick Evaluation

## 2015-08-07 NOTE — Brief Op Note (Signed)
08/07/2015  1:49 PM  PATIENT:  Emily Maxwell  74 y.o. female  PRE-OPERATIVE DIAGNOSIS:  LEFT RENAL PELVIS MASS  POST-OPERATIVE DIAGNOSIS:  LEFT RENAL PELVIS MASS  PROCEDURE:  Procedure(s): CYSTOSCOPY WITH BILATERAL RETROGRADE PYELOGRAM, LEFT URETEROSCOPY WITH BIOPSY AND  LEFT STENT PLACEMENT (Bilateral)  SURGEON:  Surgeon(s) and Role:    * Alexis Frock, MD - Primary  PHYSICIAN ASSISTANT:   ASSISTANTS: none   ANESTHESIA:   general  EBL:     BLOOD ADMINISTERED:none  DRAINS: none   LOCAL MEDICATIONS USED:  NONE  SPECIMEN:  Source of Specimen:  left renal pelvis tumor fragments  DISPOSITION OF SPECIMEN:  PATHOLOGY  COUNTS:  YES  TOURNIQUET:  * No tourniquets in log *  DICTATION: .Other Dictation: Dictation Number [id number]  PLAN OF CARE: Discharge to home after PACU  PATIENT DISPOSITION:  PACU - hemodynamically stable.   Delay start of Pharmacological VTE agent (>24hrs) due to surgical blood loss or risk of bleeding: no

## 2015-08-07 NOTE — Discharge Instructions (Signed)
1 - You may have urinary urgency (bladder spasms) and bloody urine on / off with stent in place. This is normal. ° °2 - Call MD or go to ER for fever >102, severe pain / nausea / vomiting not relieved by medications, or acute change in medical status ° °

## 2015-08-07 NOTE — H&P (Signed)
Emily Maxwell is an 74 y.o. female.    Chief Complaint: Pre-Op Left Ureteroscopy with Possible Biopsy / Stent placement  HPI:   1 - Left Renal Pelvis Mass - several cm left lower pole renal mass with extension to renal pelvis by CT 07/2015 on eval gross hematuria. 1 artery / 1 vein (lumbar noted just below artery) renovascular anatomy.  Cr 1.0. No contralteral lesions or obvious retroperitoneal adenopathy. No bladder lesions by office cysto.  2 - Gross Hematuria - visible blood in urine 2016. CT and cysto with left renal pelvis mass as per above. Non smoker. No chronic chemical / solvent exposure.  PMH sig for hypothyroid after thyroidectomy (benign), ortho surgery x severa, ENT surgery x several, lap chole, tubal with umbil hernia repair. NO blood thinners or ischemic cardiac disease. Mild thoracic-only AAA on surveillance. Her PCP is Marton Redwood MD with [facility].   Today "Emily Maxwell" is seen to proceed with left ureteroscopy for diagnostic intent to further characterize her left renal pelvis mass. No interval fevers. Most recent UCX negative.   Past Medical History  Diagnosis Date  . Nodule of left lung     stable, surveillance  Dr Brigitte Pulse  . RLS (restless legs syndrome)   . Hyperlipidemia   . Asthma   . Hypertension   . Clarise Cruz Agers syndrome   . Rosacea   . Osteopenia   . IFG (impaired fasting glucose)   . Hyperplastic colon polyp   . Hypothyroidism   . Asthma   . Simple endometrial hyperplasia   . Complication of anesthesia     pulse rate slow   . Arthritis     thumbs   . Anxiety     hx of  anxiety , had stress test several years ago per patient that was normal   . Vasovagal reaction     hx of   . Ascending aorta dilation ([place]) 08/07/2012  . Aneurysm, ascending aorta ([place]) 2013    4 cm  . Thoracic aortic aneurysm ([place])     stable per patient- requested last scan on 10/18/done in 2015 from office of Dr Brigitte Pulse.      Past Surgical History  Procedure Laterality  Date  . Thyroidectomy  11/2009    S/P Adenoma follicular vs hyperplastic  . Left shoulder surgery 2005    . Lt wrist surgery 2005    . Acoustic neuroma s/p resection lt ear deafness 1990    . Btl    . Bumonectomy    . Lt knee surgery 2008 Left 2008    tears x 2  . Sp cholecystomy    . Craniectomy for excision of acoustic neuroma Left 1990    Deaf on Left side  . Eye surgery  2013    eyelid drooping fixed  . Wrist surgery Left 2007    wrist cyst removed  . Tonsillectomy  age 65  . Tubal ligation  1976  . Cataract extraction Bilateral 2004  . Left hand middle finger surgery   2015     cadaver bone placed     Family History  Problem Relation Age of Onset  . Cancer Father   . Cancer Maternal Grandmother   . Heart disease Maternal Grandfather   . Stroke Maternal Grandfather   . Hypertension Mother    Social History:  reports that she has never smoked. She has never used smokeless tobacco. She reports that she drinks alcohol. She reports that she does not use illicit drugs.  Allergies:  Allergies  Allergen Reactions  . Avelox [Moxifloxacin Hcl In Nacl]   . Epinephrine Other (See Comments)    Makes heart rate increase and cold sweats   . Tramadol   . Bactrim [Sulfamethoxazole-Trimethoprim] Rash  . Penicillins Rash    No prescriptions prior to admission    No results found for this or any previous visit (from the past 48 hour(s)). No results found.  Review of Systems  Constitutional: Negative.   HENT: Negative.   Eyes: Negative.   Respiratory: Negative.   Cardiovascular: Negative.   Gastrointestinal: Negative.   Genitourinary: Positive for hematuria and flank pain.  Musculoskeletal: Negative.   Skin: Negative.   Neurological: Negative.   Endo/Heme/Allergies: Negative.   Psychiatric/Behavioral: Negative.     Last menstrual period 07/11/1983. Physical Exam  Constitutional: She appears well-developed.  HENT:  Head: Normocephalic.  Eyes: Pupils are equal,  round, and reactive to light.  Neck: Normal range of motion.  Cardiovascular: Normal rate.   Respiratory: Effort normal.  GI: Soft.  Genitourinary:  No CVAT  Musculoskeletal: Normal range of motion.  Neurological: She is alert.  Skin: Skin is warm.  Psychiatric: She has a normal mood and affect. Her behavior is normal. Judgment and thought content normal.     Assessment/Plan  1 - Left Renal Pelvis Mass - very worrisome for urothelial malignancy. Needs cysto, bilat RPG"s, left ureteroscopy / BX ASAP. Risks, benefits, alternatives re- discussed. Will very likely need eventual left nephro-ureterectomy pending findings from today's surgery.    2 - Gross Hematuria - likely from mass above. Will verify at endoscopic exam / biopsy.  Pranavi Aure 08/07/2015, 5:52 AM

## 2015-08-08 ENCOUNTER — Other Ambulatory Visit: Payer: Self-pay | Admitting: Urology

## 2015-08-08 NOTE — Op Note (Signed)
NAMETAKITA, Maxwell NO.:  [id number]  MEDICAL RECORD NO.:  [id number]  LOCATION:  [facility]                         FACILITY:  [facility]  PHYSICIAN:  Alexis Frock, MD     DATE OF BIRTH:  1940/11/09  DATE OF PROCEDURE: 08/07/2015                               OPERATIVE REPORT  PREOPERATIVE DIAGNOSIS:  Left renal pelvis mass, gross hematuria.  POSTOPERATIVE DIAGNOSIS:  Left renal pelvis mass, gross hematuria.  PROCEDURE: 1. Cystoscopy with bilateral retrograde pyelogram interpretation. 2. Left ureteroscopy with ureteroscopic biopsy. 3. Left ureteral stent placement, 6 x 24 Contour, no tether.  ESTIMATED BLOOD LOSS:  Nil.  COMPLICATIONS:  None.  SPECIMENS:  Left renal pelvis mass fragments for pathologic analysis.  FINDINGS: 1. Unremarkable urinary bladder. 2. Unremarkable right retrograde pyelogram. 3. Left retrograde pyelogram with filling defect in the renal pelvis. 4. Large amount of broad-based papillary tumor in the left renal     pelvis.  This is estimated to be at least 2 cm diameter at the     level of the urothelium, this was not on a small stalk. 5. Successful placement of left ureteral stent, proximal in the renal     pelvis and distal in the urinary bladder.  PREOPERATIVE INDICATIONS:  Emily Maxwell is a very pleasant 74 year old lady without significant medical comorbidities.  She was found on workup for gross hematuria to have what appeared to be a left renal pelvis mass and/or clot material.  Contralateral kidney unremarkable.  Cystoscopy showed no urothelial lesions.  She was referred for further evaluation and workup of this.  She was seen in the office in consultation and was felt that the next clinical step would best be left diagnostic ureteroscopy to help rule in or out urothelial lesion in the left renal pelvis, and she wished to proceed.  Informed consent was obtained and placed in medical record.  PROCEDURE IN DETAIL:  The patient being  Emily Maxwell and procedure being left ureteroscopy with basketing of left renal pelvis mass was confirmed. Procedure was carried out.  Time-out was performed. Intravenous antibiotics were administered.  General LMA anesthesia was introduced.  The patient was placed into a low lithotomy position and sterile field was created by prepping and draping the patient's vagina, introitus, and proximal thighs using iodine x3.  Next, cystourethroscopy was performed using a 23-French rigid cystoscope with 30-degree offset lens.  Inspection of bladder revealed no diverticula, calcifications, or papular lesions.  Ureteral orifices were in the normal anatomic position and singleton.  The right ureteral orifice was cannulated with 6-French end-hole catheter and right retrograde pyelogram was obtained.  Right retrograde pyelogram demonstrates single right ureter with single system right kidney.  No filling defects or narrowing noted.  Similarly, left retrograde pyelogram was obtained.  Left pyelogram demonstrated a single left ureter with single system left kidney.  There was a likely filling defect hopefully in appearance in the renal pelvis, worrisome for possible intraluminal mass.  A 0.038 ZIPwire was advanced at the level of the upper pole and set aside as a safety wire.  An 8-French feeding tube placed in urinary bladder for pressure release.  Next, semi-rigid ureteroscopy was  performed in entire length of left ureter alongside a separate Sensor working wire.  No mucosal abnormalities were found.  The semi-rigid scope was exchanged for a 12/14, 24 cm ureteral access sheath at the level of proximal ureter using continuous fluoroscopic guidance.  Next, flexible digital ureteroscopy was performed in the proximal ureter and systematic inspection of the left kidney including all calices x2.  There was a significant volume of papillary appearing tumor that was broad based occupying the inferolateral  aspect of the renal pelvis.  This was estimated to be at least 2 cm diameter circumference of the base of the urothelium there, this did not appear to be on a simple, thin stalk. Photodocumentation was performed.  Next, attention was directed to ureteroscopic biopsy.  Initially, the BIGopsy apparatus was used by backloading and grasping tissue fragments of the papillary tissue in question, degenerated, incredibly small fragments that were set aside for permanent pathology.  In order to hopefully gain better tissue, an escape basket was used in a snaring technique and representative portions of the papillary tumor were snared and removed ureteroscopically and set aside for permanent pathology. This generated more acceptable tissue fragments which were then set aside for permanent pathology, labeled as left renal pelvis mass. Following these maneuvers, there was some mucosal oozing as expected from the area of the left renal pelvis, but no pulsatile bleeding, no evidence of renal perforation.  It was felt that ureteral stenting would be warranted to help prevent clot colic.  As such, a new 6 x 24 Contour- type stent was placed with remaining safety wire using cystoscopic and fluoroscopic guidance.  Good proximal and distal deployment were noted. Efflux of urine was seen around and into the distal end of the stent. Bladder was emptied per cystoscope.  Procedure was then terminated. The patient tolerated the procedure well.  No immediate periprocedural complications.  The patient was taken to the postanesthesia care unit in stable condition.          ______________________________ Alexis Frock, MD     TM/MEDQ  D:  08/07/2015  T:  08/07/2015  Job:  [id number]

## 2015-08-12 ENCOUNTER — Ambulatory Visit (INDEPENDENT_AMBULATORY_CARE_PROVIDER_SITE_OTHER): Payer: Medicare Other

## 2015-08-12 DIAGNOSIS — J309 Allergic rhinitis, unspecified: Secondary | ICD-10-CM | POA: Diagnosis not present

## 2015-08-13 DIAGNOSIS — R0789 Other chest pain: Secondary | ICD-10-CM | POA: Diagnosis not present

## 2015-08-13 DIAGNOSIS — Z6832 Body mass index (BMI) 32.0-32.9, adult: Secondary | ICD-10-CM | POA: Diagnosis not present

## 2015-08-13 DIAGNOSIS — C652 Malignant neoplasm of left renal pelvis: Secondary | ICD-10-CM | POA: Diagnosis not present

## 2015-08-19 ENCOUNTER — Ambulatory Visit (INDEPENDENT_AMBULATORY_CARE_PROVIDER_SITE_OTHER): Payer: Medicare Other | Admitting: *Deleted

## 2015-08-19 DIAGNOSIS — J309 Allergic rhinitis, unspecified: Secondary | ICD-10-CM

## 2015-08-21 DIAGNOSIS — Z01419 Encounter for gynecological examination (general) (routine) without abnormal findings: Secondary | ICD-10-CM | POA: Diagnosis not present

## 2015-08-21 DIAGNOSIS — Z1231 Encounter for screening mammogram for malignant neoplasm of breast: Secondary | ICD-10-CM | POA: Diagnosis not present

## 2015-08-21 DIAGNOSIS — Z6832 Body mass index (BMI) 32.0-32.9, adult: Secondary | ICD-10-CM | POA: Diagnosis not present

## 2015-08-28 DIAGNOSIS — C652 Malignant neoplasm of left renal pelvis: Secondary | ICD-10-CM | POA: Diagnosis not present

## 2015-08-28 DIAGNOSIS — R310 Gross hematuria: Secondary | ICD-10-CM | POA: Diagnosis not present

## 2015-09-08 ENCOUNTER — Other Ambulatory Visit: Payer: Self-pay | Admitting: *Deleted

## 2015-09-08 MED ORDER — LEVALBUTEROL TARTRATE 45 MCG/ACT IN AERO: 1.0000 | INHALATION_SPRAY | RESPIRATORY_TRACT | Status: DC | PRN

## 2015-09-15 NOTE — Progress Notes (Signed)
Chest xray 08-13-15 [facility] on chart ekg 08-07-15 epic

## 2015-09-15 NOTE — Patient Instructions (Addendum)
Casandra Robers  09/15/2015   Your procedure is scheduled on: 09-19-15  Report to [facility]  Entrance take [facility]  elevators to 3rd floor to  [place] at 1015 AM.  Call this number if you have problems the morning of surgery [phone]   Remember: ONLY 1 PERSON MAY GO WITH YOU TO SHORT STAY TO GET  READY MORNING OF [place].  Do not eat food :After Midnight Wednesday, clear liquids all day Thursday 09-18-15, no clear liquids after midnight.      Take these medicines the morning of surgery with A SIP OF WATER: Xopenex inhaler if needed and bring inhaler, Levothyroxine (Synthyroid), Loratadine (Claritin)                               You may not have any metal on your body including hair pins and              piercings  Do not wear jewelry, make-up, lotions, powders or perfumes, deodorant             Do not wear nail polish.  Do not shave  48 hours prior to surgery.              Men may shave face and neck.   Do not bring valuables to the hospital. [place].  Contacts, dentures or bridgework may not be worn into surgery.  Leave suitcase in the car. After surgery it may be brought to your room.                 [place] - Preparing for Surgery Before surgery, you can play an important role.  Because skin is not sterile, your skin needs to be as free of germs as possible.  You can reduce the number of germs on your skin by washing with CHG (chlorahexidine gluconate) soap before surgery.  CHG is an antiseptic cleaner which kills germs and bonds with the skin to continue killing germs even after washing. Please DO NOT use if you have an allergy to CHG or antibacterial soaps.  If your skin becomes reddened/irritated stop using the CHG and inform your nurse when you arrive at Short Stay. Do not shave (including legs and underarms) for at least 48 hours prior to the first CHG shower.  You may shave your  face/neck. Please follow these instructions carefully:  1.  Shower with CHG Soap the night before surgery and the  morning of Surgery.  2.  If you choose to wash your hair, wash your hair first as usual with your  normal  shampoo.  3.  After you shampoo, rinse your hair and body thoroughly to remove the  shampoo.                           4.  Use CHG as you would any other liquid soap.  You can apply chg directly  to the skin and wash                       Gently with a scrungie or clean washcloth.  5.  Apply the CHG Soap to your body ONLY FROM THE  NECK DOWN.   Do not use on face/ open                           Wound or open sores. Avoid contact with eyes, ears mouth and genitals (private parts).                       Wash face,  Genitals (private parts) with your normal soap.             6.  Wash thoroughly, paying special attention to the area where your surgery  will be performed.  7.  Thoroughly rinse your body with warm water from the neck down.  8.  DO NOT shower/wash with your normal soap after using and rinsing off  the CHG Soap.                9.  Pat yourself dry with a clean towel.            10.  Wear clean pajamas.            11.  Place clean sheets on your bed the night of your first shower and do not  sleep with pets. Day of Surgery : Do not apply any lotions/deodorants the morning of surgery.  Please wear clean clothes to the hospital/surgery center.  FAILURE TO FOLLOW THESE INSTRUCTIONS MAY RESULT IN THE CANCELLATION OF YOUR SURGERY PATIENT SIGNATURE_________________________________  NURSE SIGNATURE__________________________________  ________________________________________________________________________

## 2015-09-16 ENCOUNTER — Encounter (HOSPITAL_COMMUNITY): Payer: Self-pay

## 2015-09-16 ENCOUNTER — Encounter (HOSPITAL_COMMUNITY)
Admission: RE | Admit: 2015-09-16 | Discharge: 2015-09-16 | Disposition: A | Discharge status: Home / Self Care | Payer: Medicare Other | Source: Ambulatory Visit | Attending: Urology | Admitting: Urology

## 2015-09-16 HISTORY — DX: Unspecified hearing loss, unspecified ear: H91.90

## 2015-09-16 HISTORY — DX: Malignant (primary) neoplasm, unspecified: C80.1

## 2015-09-16 HISTORY — DX: Hematuria, unspecified: R31.9

## 2015-09-16 HISTORY — DX: Cardiac arrhythmia, unspecified: I49.9

## 2015-09-16 HISTORY — DX: Anesthesia of skin: R20.0

## 2015-09-16 LAB — CBC
HEMATOCRIT: 42.8 % (ref 36.0–46.0)
HEMOGLOBIN: 14.4 g/dL (ref 12.0–15.0)
MCH: 31.3 pg (ref 26.0–34.0)
MCHC: 33.6 g/dL (ref 30.0–36.0)
MCV: 93 fL (ref 78.0–100.0)
Platelets: 245 10*3/uL (ref 150–400)
RBC: 4.6 MIL/uL (ref 3.87–5.11)
RDW: 12 % (ref 11.5–15.5)
WBC: 4.1 10*3/uL (ref 4.0–10.5)

## 2015-09-16 LAB — BASIC METABOLIC PANEL
ANION GAP: 7 (ref 5–15)
BUN: 15 mg/dL (ref 6–20)
CO2: 27 mmol/L (ref 22–32)
Calcium: 9.7 mg/dL (ref 8.9–10.3)
Chloride: 104 mmol/L (ref 101–111)
Creatinine, Ser: 0.93 mg/dL (ref 0.44–1.00)
GFR, EST NON AFRICAN AMERICAN: 59 mL/min — AB (ref >60)
GLUCOSE: 102 mg/dL — AB (ref 65–99)
POTASSIUM: 4.3 mmol/L (ref 3.5–5.1)
Sodium: 138 mmol/L (ref 135–145)

## 2015-09-16 LAB — ABO/RH: ABO/RH(D): A POS

## 2015-09-16 NOTE — Progress Notes (Signed)
Stable thorarci aortic aneurysm 38 mm per 07-11-14 chest ct [facility] on chart

## 2015-09-19 ENCOUNTER — Inpatient Hospital Stay (HOSPITAL_COMMUNITY): Payer: Medicare Other | Admitting: Certified Registered"

## 2015-09-19 ENCOUNTER — Encounter (HOSPITAL_COMMUNITY)
Admission: RE | Disposition: A | Discharge status: Home / Self Care | Payer: Self-pay | Source: Ambulatory Visit | Attending: Urology

## 2015-09-19 ENCOUNTER — Encounter (HOSPITAL_COMMUNITY): Payer: Self-pay | Admitting: *Deleted

## 2015-09-19 ENCOUNTER — Inpatient Hospital Stay (HOSPITAL_COMMUNITY)
Admission: RE | Admit: 2015-09-19 | Discharge: 2015-09-21 | DRG: 655 | Disposition: A | Discharge status: Home / Self Care | Payer: Medicare Other | Source: Ambulatory Visit | Attending: Urology | Admitting: Urology

## 2015-09-19 DIAGNOSIS — C652 Malignant neoplasm of left renal pelvis: Principal | ICD-10-CM | POA: Diagnosis present

## 2015-09-19 DIAGNOSIS — C642 Malignant neoplasm of left kidney, except renal pelvis: Secondary | ICD-10-CM | POA: Diagnosis not present

## 2015-09-19 DIAGNOSIS — Z01812 Encounter for preprocedural laboratory examination: Secondary | ICD-10-CM | POA: Diagnosis not present

## 2015-09-19 DIAGNOSIS — N2889 Other specified disorders of kidney and ureter: Secondary | ICD-10-CM | POA: Diagnosis present

## 2015-09-19 DIAGNOSIS — R310 Gross hematuria: Secondary | ICD-10-CM | POA: Diagnosis not present

## 2015-09-19 DIAGNOSIS — E890 Postprocedural hypothyroidism: Secondary | ICD-10-CM | POA: Diagnosis present

## 2015-09-19 DIAGNOSIS — H9192 Unspecified hearing loss, left ear: Secondary | ICD-10-CM | POA: Diagnosis present

## 2015-09-19 DIAGNOSIS — G2581 Restless legs syndrome: Secondary | ICD-10-CM | POA: Diagnosis present

## 2015-09-19 DIAGNOSIS — I712 Thoracic aortic aneurysm, without rupture: Secondary | ICD-10-CM | POA: Diagnosis not present

## 2015-09-19 DIAGNOSIS — K449 Diaphragmatic hernia without obstruction or gangrene: Secondary | ICD-10-CM | POA: Diagnosis present

## 2015-09-19 DIAGNOSIS — Z86010 Personal history of colonic polyps: Secondary | ICD-10-CM

## 2015-09-19 DIAGNOSIS — I739 Peripheral vascular disease, unspecified: Secondary | ICD-10-CM | POA: Diagnosis not present

## 2015-09-19 DIAGNOSIS — I10 Essential (primary) hypertension: Secondary | ICD-10-CM | POA: Diagnosis present

## 2015-09-19 DIAGNOSIS — Z8249 Family history of ischemic heart disease and other diseases of the circulatory system: Secondary | ICD-10-CM | POA: Diagnosis not present

## 2015-09-19 HISTORY — PX: ROBOT ASSITED LAPAROSCOPIC NEPHROURETERECTOMY: SHX6077

## 2015-09-19 LAB — TYPE AND SCREEN
ABO/RH(D): A POS
ANTIBODY SCREEN: NEGATIVE

## 2015-09-19 LAB — HEMOGLOBIN AND HEMATOCRIT, BLOOD
HCT: 40.8 % (ref 36.0–46.0)
Hemoglobin: 13.8 g/dL (ref 12.0–15.0)

## 2015-09-19 SURGERY — ROBOT ASSITED LAPAROSCOPIC NEPHROURETERECTOMY
Anesthesia: General | Laterality: Left

## 2015-09-19 MED ORDER — EZETIMIBE 10 MG PO TABS
10.0000 mg | ORAL_TABLET | Freq: Every day | ORAL | Status: DC
  Administered 2015-09-20 – 2015-09-21 (×2): 10 mg via ORAL
  Filled 2015-09-19 (×2): qty 1

## 2015-09-19 MED ORDER — DEXAMETHASONE SODIUM PHOSPHATE 10 MG/ML IJ SOLN
INTRAMUSCULAR | Status: AC
  Filled 2015-09-19: qty 1

## 2015-09-19 MED ORDER — WATER FOR IRRIGATION, STERILE IR SOLN
Status: DC | PRN
  Administered 2015-09-19: 1000 mL

## 2015-09-19 MED ORDER — DIPHENHYDRAMINE HCL 50 MG/ML IJ SOLN: 12.5000 mg | Freq: Four times a day (QID) | INTRAMUSCULAR | Status: DC | PRN

## 2015-09-19 MED ORDER — HYDROMORPHONE HCL 1 MG/ML IJ SOLN
0.5000 mg | INTRAMUSCULAR | Status: DC | PRN
  Administered 2015-09-19: 1 mg via INTRAVENOUS
  Administered 2015-09-19 (×2): 0.5 mg via INTRAVENOUS
  Administered 2015-09-20 (×3): 1 mg via INTRAVENOUS
  Filled 2015-09-19 (×7): qty 1

## 2015-09-19 MED ORDER — SUCCINYLCHOLINE CHLORIDE 20 MG/ML IJ SOLN
INTRAMUSCULAR | Status: DC | PRN
  Administered 2015-09-19: 100 mg via INTRAVENOUS

## 2015-09-19 MED ORDER — LACTATED RINGERS IV SOLN
INTRAVENOUS | Status: DC | PRN
  Administered 2015-09-19 (×2): via INTRAVENOUS

## 2015-09-19 MED ORDER — LIDOCAINE HCL (PF) 2 % IJ SOLN
INTRAMUSCULAR | Status: DC | PRN
  Administered 2015-09-19: 30 mg via INTRADERMAL

## 2015-09-19 MED ORDER — ONDANSETRON HCL 4 MG/2ML IJ SOLN: 4.0000 mg | INTRAMUSCULAR | Status: DC | PRN

## 2015-09-19 MED ORDER — CEFAZOLIN SODIUM-DEXTROSE 2-3 GM-% IV SOLR
2.0000 g | INTRAVENOUS | Status: AC
Start: 2015-09-19 — End: 2015-09-19
  Administered 2015-09-19: 2 g via INTRAVENOUS

## 2015-09-19 MED ORDER — ONDANSETRON HCL 4 MG/2ML IJ SOLN
INTRAMUSCULAR | Status: AC
  Filled 2015-09-19: qty 2

## 2015-09-19 MED ORDER — OXYCODONE HCL 5 MG PO TABS: 5.0000 mg | ORAL_TABLET | ORAL | Status: DC | PRN

## 2015-09-19 MED ORDER — FENTANYL CITRATE (PF) 250 MCG/5ML IJ SOLN
INTRAMUSCULAR | Status: AC
  Filled 2015-09-19: qty 5

## 2015-09-19 MED ORDER — LEVALBUTEROL HCL 0.63 MG/3ML IN NEBU: 0.6300 mg | INHALATION_SOLUTION | Freq: Four times a day (QID) | RESPIRATORY_TRACT | Status: DC | PRN

## 2015-09-19 MED ORDER — PHENYLEPHRINE HCL 10 MG/ML IJ SOLN
INTRAMUSCULAR | Status: DC | PRN
  Administered 2015-09-19 (×2): 80 ug via INTRAVENOUS

## 2015-09-19 MED ORDER — LACTATED RINGERS IV SOLN: INTRAVENOUS | Status: DC

## 2015-09-19 MED ORDER — CEFAZOLIN SODIUM-DEXTROSE 2-3 GM-% IV SOLR
INTRAVENOUS | Status: AC
  Filled 2015-09-19: qty 50

## 2015-09-19 MED ORDER — MIDAZOLAM HCL 5 MG/5ML IJ SOLN
INTRAMUSCULAR | Status: DC | PRN
  Administered 2015-09-19: 2 mg via INTRAVENOUS

## 2015-09-19 MED ORDER — HYDROMORPHONE HCL 1 MG/ML IJ SOLN
INTRAMUSCULAR | Status: DC | PRN
  Administered 2015-09-19 (×2): 1 mg via INTRAVENOUS

## 2015-09-19 MED ORDER — SODIUM CHLORIDE 0.9 % IJ SOLN
INTRAMUSCULAR | Status: DC | PRN
  Administered 2015-09-19: 20 mL via INTRAVENOUS

## 2015-09-19 MED ORDER — IRBESARTAN 150 MG PO TABS
75.0000 mg | ORAL_TABLET | Freq: Every day | ORAL | Status: DC
  Administered 2015-09-20 – 2015-09-21 (×2): 75 mg via ORAL
  Filled 2015-09-19 (×2): qty 1

## 2015-09-19 MED ORDER — SODIUM CHLORIDE 0.9 % IJ SOLN
INTRAMUSCULAR | Status: AC
  Filled 2015-09-19: qty 20

## 2015-09-19 MED ORDER — BUPIVACAINE LIPOSOME 1.3 % IJ SUSP
20.0000 mL | Freq: Once | INTRAMUSCULAR | Status: AC
  Administered 2015-09-19: 20 mL
  Filled 2015-09-19: qty 20

## 2015-09-19 MED ORDER — LACTATED RINGERS IR SOLN
Status: DC | PRN
  Administered 2015-09-19: 1

## 2015-09-19 MED ORDER — FENTANYL CITRATE (PF) 100 MCG/2ML IJ SOLN
INTRAMUSCULAR | Status: DC | PRN
  Administered 2015-09-19: 100 ug via INTRAVENOUS

## 2015-09-19 MED ORDER — LEVOTHYROXINE SODIUM 100 MCG PO TABS
100.0000 ug | ORAL_TABLET | Freq: Every day | ORAL | Status: DC
  Administered 2015-09-20: 100 ug via ORAL
  Filled 2015-09-19: qty 1

## 2015-09-19 MED ORDER — FENTANYL CITRATE (PF) 100 MCG/2ML IJ SOLN
INTRAMUSCULAR | Status: AC
Start: 2015-09-19 — End: 2015-09-20
  Filled 2015-09-19: qty 2

## 2015-09-19 MED ORDER — ONDANSETRON HCL 4 MG/2ML IJ SOLN
INTRAMUSCULAR | Status: DC | PRN
  Administered 2015-09-19: 4 mg via INTRAVENOUS

## 2015-09-19 MED ORDER — LEVALBUTEROL HCL 0.63 MG/3ML IN NEBU: 0.6300 mg | INHALATION_SOLUTION | RESPIRATORY_TRACT | Status: DC | PRN

## 2015-09-19 MED ORDER — LEVALBUTEROL TARTRATE 45 MCG/ACT IN AERO: 1.0000 | INHALATION_SPRAY | RESPIRATORY_TRACT | Status: DC | PRN

## 2015-09-19 MED ORDER — ACETAMINOPHEN 500 MG PO TABS
1000.0000 mg | ORAL_TABLET | Freq: Four times a day (QID) | ORAL | Status: AC
  Administered 2015-09-19 – 2015-09-20 (×4): 1000 mg via ORAL
  Filled 2015-09-19 (×4): qty 2

## 2015-09-19 MED ORDER — HYDROCODONE-ACETAMINOPHEN 5-325 MG PO TABS: 1.0000 | ORAL_TABLET | Freq: Four times a day (QID) | ORAL | Status: DC | PRN

## 2015-09-19 MED ORDER — MIDAZOLAM HCL 2 MG/2ML IJ SOLN
INTRAMUSCULAR | Status: AC
  Filled 2015-09-19: qty 2

## 2015-09-19 MED ORDER — ROCURONIUM BROMIDE 100 MG/10ML IV SOLN
INTRAVENOUS | Status: AC
  Filled 2015-09-19: qty 1

## 2015-09-19 MED ORDER — MONTELUKAST SODIUM 10 MG PO TABS: 10.0000 mg | ORAL_TABLET | Freq: Every day | ORAL | Status: DC | PRN

## 2015-09-19 MED ORDER — FENTANYL CITRATE (PF) 100 MCG/2ML IJ SOLN
25.0000 ug | INTRAMUSCULAR | Status: DC | PRN
  Administered 2015-09-19 (×3): 25 ug via INTRAVENOUS

## 2015-09-19 MED ORDER — DIPHENHYDRAMINE HCL 12.5 MG/5ML PO ELIX: 12.5000 mg | ORAL_SOLUTION | Freq: Four times a day (QID) | ORAL | Status: DC | PRN

## 2015-09-19 MED ORDER — DEXTROSE-NACL 5-0.45 % IV SOLN
INTRAVENOUS | Status: DC
  Administered 2015-09-19 – 2015-09-20 (×2): via INTRAVENOUS

## 2015-09-19 MED ORDER — HYDROMORPHONE HCL 2 MG/ML IJ SOLN
INTRAMUSCULAR | Status: AC
  Filled 2015-09-19: qty 1

## 2015-09-19 MED ORDER — CEPHALEXIN 500 MG PO CAPS: 500.0000 mg | ORAL_CAPSULE | Freq: Two times a day (BID) | ORAL | Status: DC

## 2015-09-19 MED ORDER — SUGAMMADEX SODIUM 500 MG/5ML IV SOLN
INTRAVENOUS | Status: AC
  Filled 2015-09-19: qty 5

## 2015-09-19 MED ORDER — DEXAMETHASONE SODIUM PHOSPHATE 10 MG/ML IJ SOLN
INTRAMUSCULAR | Status: DC | PRN
  Administered 2015-09-19: 10 mg via INTRAVENOUS

## 2015-09-19 MED ORDER — SUGAMMADEX SODIUM 500 MG/5ML IV SOLN
INTRAVENOUS | Status: DC | PRN
  Administered 2015-09-19: 400 mg via INTRAVENOUS

## 2015-09-19 MED ORDER — ROCURONIUM BROMIDE 100 MG/10ML IV SOLN
INTRAVENOUS | Status: DC | PRN
  Administered 2015-09-19: 20 mg via INTRAVENOUS
  Administered 2015-09-19 (×2): 10 mg via INTRAVENOUS
  Administered 2015-09-19: 5 mg via INTRAVENOUS
  Administered 2015-09-19: 45 mg via INTRAVENOUS

## 2015-09-19 MED ORDER — PROPOFOL 10 MG/ML IV BOLUS
INTRAVENOUS | Status: AC
  Filled 2015-09-19: qty 20

## 2015-09-19 MED ORDER — PROPOFOL 10 MG/ML IV BOLUS
INTRAVENOUS | Status: DC | PRN
  Administered 2015-09-19: 140 mg via INTRAVENOUS

## 2015-09-19 SURGICAL SUPPLY — 67 items
CATH FOLEY 3WAY  5CC 18FR (CATHETERS) ×1
CATH FOLEY 3WAY 5CC 18FR (CATHETERS) ×1 IMPLANT
CHLORAPREP W/TINT 26ML (MISCELLANEOUS) ×2 IMPLANT
CLIP LIGATING HEM O LOK PURPLE (MISCELLANEOUS) IMPLANT
CLIP LIGATING HEMO LOK XL GOLD (MISCELLANEOUS) ×2 IMPLANT
CLIP LIGATING HEMO O LOK GREEN (MISCELLANEOUS) IMPLANT
CORDS BIPOLAR (ELECTRODE) ×2 IMPLANT
COVER SURGICAL LIGHT HANDLE (MISCELLANEOUS) ×2 IMPLANT
COVER TIP SHEARS 8 DVNC (MISCELLANEOUS) ×1 IMPLANT
COVER TIP SHEARS 8MM DA VINCI (MISCELLANEOUS) ×1
CUTTER ECHEON FLEX ENDO 45 340 (ENDOMECHANICALS) IMPLANT
DECANTER SPIKE VIAL GLASS SM (MISCELLANEOUS) ×2 IMPLANT
DRAIN CHANNEL 15F RND FF 3/16 (WOUND CARE) ×2 IMPLANT
DRAPE INCISE IOBAN 66X45 STRL (DRAPES) ×2 IMPLANT
DRAPE LAPAROSCOPIC ABDOMINAL (DRAPES) ×2 IMPLANT
DRAPE SHEET LG 3/4 BI-LAMINATE (DRAPES) ×2 IMPLANT
DRAPE TABLE BACK 44X90 PK DISP (DRAPES) ×2 IMPLANT
DRAPE WARM FLUID 44X44 (DRAPE) ×2 IMPLANT
DRESSING SURGICEL FIBRLLR 1X2 (HEMOSTASIS) ×1 IMPLANT
DRSG SURGICEL FIBRILLAR 1X2 (HEMOSTASIS) ×2
ELECT PENCIL ROCKER SW 15FT (MISCELLANEOUS) ×2 IMPLANT
ELECT REM PT RETURN 9FT ADLT (ELECTROSURGICAL) ×2
ELECTRODE REM PT RTRN 9FT ADLT (ELECTROSURGICAL) ×1 IMPLANT
EVACUATOR SILICONE 100CC (DRAIN) ×2 IMPLANT
GLOVE BIOGEL M STRL SZ7.5 (GLOVE) ×6 IMPLANT
GOWN STRL REUS W/TWL LRG LVL3 (GOWN DISPOSABLE) ×4 IMPLANT
HEMOSTAT SURGICEL 4X8 (HEMOSTASIS) IMPLANT
KIT ACCESSORY DA VINCI DISP (KITS) ×1
KIT ACCESSORY DVNC DISP (KITS) ×1 IMPLANT
KIT BASIN OR (CUSTOM PROCEDURE TRAY) ×2 IMPLANT
LIQUID BAND (GAUZE/BANDAGES/DRESSINGS) ×2 IMPLANT
NEEDLE INSUFFLATION 14GA 120MM (NEEDLE) ×2 IMPLANT
PAD POSITIONING PINK XL (MISCELLANEOUS) IMPLANT
PEN SKIN MARKING BROAD (MISCELLANEOUS) ×2 IMPLANT
POSITIONER SURGICAL ARM (MISCELLANEOUS) ×4 IMPLANT
POUCH ENDO CATCH II 15MM (MISCELLANEOUS) ×2 IMPLANT
POUCH SPECIMEN RETRIEVAL 10MM (ENDOMECHANICALS) IMPLANT
RELOAD WH ECHELON 45 (STAPLE) ×10 IMPLANT
RETRACTOR LAPSCP 12X46 CVD (ENDOMECHANICALS) IMPLANT
RTRCTR LAPSCP 12X46 CVD (ENDOMECHANICALS)
SET TUBE IRRIG SUCTION NO TIP (IRRIGATION / IRRIGATOR) IMPLANT
SHEET LAVH (DRAPES) IMPLANT
SOLUTION ANTI FOG 6CC (MISCELLANEOUS) ×2 IMPLANT
SOLUTION ELECTROLUBE (MISCELLANEOUS) ×2 IMPLANT
SPONGE LAP 18X18 X RAY DECT (DISPOSABLE) ×2 IMPLANT
SPONGE LAP 4X18 X RAY DECT (DISPOSABLE) ×2 IMPLANT
STAPLER 45 WHITE RELOAD XI (STAPLE) ×2
STAPLER 45 WHT RELOAD XI (STAPLE) ×2 IMPLANT
SURGILUBE 3G PEEL PACK STRL (MISCELLANEOUS) ×2 IMPLANT
SUT ETHILON 3 0 PS 1 (SUTURE) ×2 IMPLANT
SUT MNCRL AB 4-0 PS2 18 (SUTURE) ×4 IMPLANT
SUT PDS AB 1 CT1 27 (SUTURE) ×4 IMPLANT
SUT PDS AB 1 CTX 36 (SUTURE) ×4 IMPLANT
SUT PROLENE 0 CT 2 (SUTURE) ×2 IMPLANT
SUT VIC AB 2-0 SH 27 (SUTURE) ×1
SUT VIC AB 2-0 SH 27X BRD (SUTURE) ×1 IMPLANT
SUT VICRYL 0 UR6 27IN ABS (SUTURE) IMPLANT
TOWEL OR NON WOVEN STRL DISP B (DISPOSABLE) ×2 IMPLANT
TRAY LAPAROSCOPIC (CUSTOM PROCEDURE TRAY) ×2 IMPLANT
TROCAR 12M 150ML BLUNT (TROCAR) ×2 IMPLANT
TROCAR BLADELESS OPT 5 75 (ENDOMECHANICALS) IMPLANT
TROCAR UNIVERSAL OPT 12M 100M (ENDOMECHANICALS) ×2 IMPLANT
TROCAR XCEL 12X100 BLDLESS (ENDOMECHANICALS) ×2 IMPLANT
TUBING INSUFFLATION 10FT LAP (TUBING) ×2 IMPLANT
URINEMETER 200ML W/220 (MISCELLANEOUS) IMPLANT
WATER STERILE IRR 1000ML UROMA (IV SOLUTION) ×2 IMPLANT
WATER STERILE IRR 1500ML POUR (IV SOLUTION) ×4 IMPLANT

## 2015-09-19 NOTE — Brief Op Note (Signed)
09/19/2015  3:38 PM  PATIENT:  Ermalinda Barrios  74 y.o. female  PRE-OPERATIVE DIAGNOSIS:  LEFT RENAL PELVIC CANCER  POST-OPERATIVE DIAGNOSIS:  LEFT RENAL PELVIC CANCER  PROCEDURE:  Procedure(s): ROBOT ASSITED LAPAROSCOPIC NEPHROURETERECTOMY WITH DIAPHRAMATIC HERNIA REPAIR (Left)  SURGEON:  Surgeon(s) and Role:    * Alexis Frock, MD - Primary  PHYSICIAN ASSISTANT:   ASSISTANTS: Debbrah Alar, PA   ANESTHESIA:   general, local  EBL:  Total I/O In: 1000 [I.V.:1000] Out: -   BLOOD ADMINISTERED:none  DRAINS: JP to bulb, Foley to gravity   LOCAL MEDICATIONS USED:  MARCAINE     SPECIMEN:  Source of Specimen:  left kidney, ureter, bladder cuff en bloc for permanant pathology  DISPOSITION OF SPECIMEN:  PATHOLOGY  COUNTS:  YES  TOURNIQUET:  * No tourniquets in log *  DICTATION: .Other Dictation: Dictation Number [id number]  PLAN OF CARE: Admit to inpatient   PATIENT DISPOSITION:  PACU - hemodynamically stable.   Delay start of Pharmacological VTE agent (>24hrs) due to surgical blood loss or risk of bleeding: yes

## 2015-09-19 NOTE — Transfer of Care (Signed)
Immediate Anesthesia Transfer of Care Note  Patient: Emily Maxwell  Procedure(s) Performed: Procedure(s): ROBOT ASSITED LAPAROSCOPIC NEPHROURETERECTOMY WITH DIAPHRAMATIC HERNIA REPAIR (Left)  Patient Location: PACU  Anesthesia Type:General  Level of Consciousness:  sedated, patient cooperative and responds to stimulation  Airway & Oxygen Therapy:Patient Spontanous Breathing and Patient connected to face mask oxgen  Post-op Assessment:  Report given to PACU RN and Post -op Vital signs reviewed and stable  Post vital signs:  Reviewed and stable  Last Vitals:  Filed Vitals:   09/19/15 1039  BP: 156/69  Pulse: 84  Temp: 36.8 C  Resp: 20    Complications: No apparent anesthesia complications

## 2015-09-19 NOTE — H&P (Signed)
Emily Maxwell is an 74 y.o. female.    Chief Complaint: Pre-Op Left Robotic Nephroureterectomy  HPI:   1 - Large Volume Left Renal Pelvis Cancer - >2cm broad based low-grade TCC confirmed by ureteroscopic biopsy / JJ stent placement 07/2015 on eval left renal pelvis filling defect / gross hematuria.  1 artery / 1 vein (lumbar noted just below artery) renovascular anatomy.  Cr 1.0. No contralteral lesions or obvious retroperitoneal adenopathy. No bladder lesions by office or recent OR cysto.  2 - Gross Hematuria - visible blood in urine 2016. CT and cysto with left renal pelvis mass as per above. Non smoker. No chronic chemical / solvent exposure.  PMH sig for hypothyroid after thyroidectomy (benign), ortho surgery x several, ENT surgery x several, lap chole, tubal with umbil hernia repair. NO blood thinners or ischemic cardiac disease. Mild thoracic-only AAA on surveillance. Her PCP is Emily Maxwell with [facility].   Today "Emily Maxwell" is seen to proceed with left nephro-ureterectomy. No interval fevers. She has left JJ stent in situ.   Past Medical History  Diagnosis Date  . Nodule of left lung     stable, surveillance  Dr Brigitte Pulse  . RLS (restless legs syndrome)   . Hyperlipidemia   . Asthma   . Hypertension   . Rosacea   . Osteopenia   . IFG (impaired fasting glucose)   . Hyperplastic colon polyp   . Hypothyroidism   . Asthma   . Simple endometrial hyperplasia years ago  . Anxiety     hx of  anxiety , had stress test several years ago per patient that was normal   . Vasovagal reaction     hx of   . Dysrhythmia     occasional arrythmia, no meds taken for  . Numbness     left thigh, when walking or standing for long periods of time  . Arthritis     thumbs , oa  . Hematuria     no blood seen recently  . Hematuria   . Cancer [facility])     cancer left kidney  . Deaf     left ear  . Complication of anesthesia     pulse rate slow in past, did ok with recent  anesthesia  . Ascending aorta dilation ([place]) 08/07/2012  . Aneurysm, ascending aorta ([place]) 2013    15mm  . Thoracic aortic aneurysm ([place])     stable per last chest ct 07-11-14 [facility] on chart-     Past Surgical History  Procedure Laterality Date  . Thyroidectomy  11/2009    S/P Adenoma follicular vs hyperplastic  . Left shoulder surgery 2005    . Lt wrist surgery 2005    . Acoustic neuroma s/p resection lt ear deafness 1990      tinnitus  from left ear  . Btl    . Bumonectomy Bilateral     x 2  . Lt knee surgery 2008 Left 2008    tears twice one surgery  . Sp cholecystomy    . Craniectomy for excision of acoustic neuroma Left 1990    Deaf on Left side  . Wrist surgery Left 2007    wrist cyst removed  . Tonsillectomy  age 29  . Tubal ligation  1976  . Cataract extraction Bilateral 2004  . Left hand middle finger surgery   2015     cadaver bone placed   . Cystoscopy with retrograde pyelogram, ureteroscopy and stent placement Bilateral  08/07/2015    Procedure: CYSTOSCOPY WITH BILATERAL RETROGRADE PYELOGRAM, LEFT URETEROSCOPY WITH BIOPSY AND  LEFT STENT PLACEMENT;  Surgeon: Alexis Frock, Maxwell;  Location: [facility] ORS;  Service: Urology;  Laterality: Bilateral;  . Eye surgery Bilateral 2013    eyelid drooping fixed  . Eye surgery Bilateral     yag procedure after cataracts  . Mortone neuroma  2010    left foot 3rd and 4th toe    Family History  Problem Relation Age of Onset  . Cancer Father   . Cancer Maternal Grandmother   . Heart disease Maternal Grandfather   . Stroke Maternal Grandfather   . Hypertension Mother    Social History:  reports that she has never smoked. She has never used smokeless tobacco. She reports that she drinks alcohol. She reports that she does not use illicit drugs.  Allergies:  Allergies  Allergen Reactions  . Avelox [Moxifloxacin Hcl In Nacl] Other (See Comments)    Unable to describe reaction  . Epinephrine Other (See Comments)    Makes  heart rate increase and cold sweats   . Tramadol Other (See Comments)    Unable to describe reaction  . Penicillins Rash    Childhood rxn from weekly PCN shots w/ asthma Tx Has patient had a PCN reaction causing immediate rash, facial/tongue/throat swelling, SOB or lightheadedness with hypotension: unknown Has patient had a PCN reaction causing severe rash involving mucus membranes or skin necrosis: unknown Has patient had a PCN reaction that required hospitalization: no   Has patient had a PCN reaction occurring within the last 10 years: no If all of the above answers are "NO", then may proceed with Cephalosporin  . Sulfa Antibiotics Rash    Tolerates non-antibiotic sulfa drugs    No prescriptions prior to admission    No results found for this or any previous visit (from the past 48 hour(s)). No results found.  Review of Systems  Constitutional: Negative.  Negative for fever and chills.  HENT: Negative.   Eyes: Negative.   Respiratory: Negative.   Cardiovascular: Negative.   Gastrointestinal: Negative.  Negative for nausea and vomiting.  Genitourinary: Negative.   Musculoskeletal: Negative.   Skin: Negative.   Neurological: Negative.   Endo/Heme/Allergies: Negative.   Psychiatric/Behavioral: Negative.     Last menstrual period 07/11/1983. Physical Exam  Constitutional: She appears well-developed.  HENT:  Head: Normocephalic.  Eyes: Pupils are equal, round, and reactive to light.  Neck: Normal range of motion.  Cardiovascular: Normal rate.   Respiratory: Effort normal.  GI: Soft.  Genitourinary:  No CVAT  Musculoskeletal: Normal range of motion.  Neurological: She is alert.  Skin: Skin is warm.  Psychiatric: She has a normal mood and affect. Her behavior is normal. Judgment and thought content normal.     Assessment/Plan  1 - Large Volume Left Renal Pelvis Cancer - proceed as planned with curative intent left nephroureterectomy today.   We rediscussed the  role of radical nephroureterectomy with the overall goal of complete surgical excision (negative margins) and better staging / diagnosis. We specifically rediscussed that with removal of the kidney there would be an overall renal function decline with attendant risks of renal failure and need for dialysis in some cases, and need for kidney-friendly lifestyle post-op with excellent blood pressure and glycemic control. We then discussed surgical approaches including robotic and open techniques with robotic associated with a shorter convalescence. I reshowed the patient on their abdomen the approximately 4-6 incision (trocar) sites as well  as presumed extraction sites with robotic approach as well as possible open incision sites. We specifically readdressed that there may be need to alter operative plans according to intraopertive findings including conversion to open procedure. I also mentioned that sometimes repositioning and additional ports (incisions) are needed for adequate bladder cuff removal. We rediscussed specific peri-operative risks including bleeding, infection, deep vein thrombosis, pulmonary embolism, compartment syndrome, nuropathy / neuropraxia, heart attack, stroke, death, as well as long-term risks such as non-cure / need for additional therapy and need for imaging and lab based post-op surveillance protocols. We rediscussed typical hospital course of approximately 2 day hospitalization, need for peri-operative drains / catheters (foley for approx 10 days with bladder cuff excision), and typical post-hospital course with return to most non-strenuous activities by 2 weeks and ability to return to most jobs and more strenuous activity such as exercise by 6 weeks.   2 - Gross Hematuria - from neoplasm above.   Verginia Toohey 09/19/2015, 6:55 AM

## 2015-09-19 NOTE — Anesthesia Preprocedure Evaluation (Signed)
Anesthesia Evaluation  Patient identified by MRN, date of birth, ID band Patient awake    Reviewed: Allergy & Precautions, H&P , NPO status , Patient's Chart, lab work & pertinent test results  Airway Mallampati: II  TM Distance: >3 FB Neck ROM: full    Dental no notable dental hx. (+) Dental Advisory Given, Teeth Intact   Pulmonary asthma ,    Pulmonary exam normal breath sounds clear to auscultation       Cardiovascular Exercise Tolerance: Good hypertension, Pt. on medications + Peripheral Vascular Disease  Normal cardiovascular exam Rhythm:regular Rate:Normal  Thoracic aortic aneurysm - stable   Neuro/Psych Anxiety negative neurological ROS  negative psych ROS   GI/Hepatic negative GI ROS, Neg liver ROS,   Endo/Other  Hypothyroidism obese  Renal/GU negative Renal ROS  negative genitourinary   Musculoskeletal  (+) Arthritis ,   Abdominal   Peds  Hematology negative hematology ROS (+)   Anesthesia Other Findings   Reproductive/Obstetrics negative OB ROS                             Anesthesia Physical Anesthesia Plan  ASA: III  Anesthesia Plan: General   Post-op Pain Management:    Induction: Intravenous  Airway Management Planned: Oral ETT  Additional Equipment:   Intra-op Plan:   Post-operative Plan: Extubation in OR  Informed Consent: I have reviewed the patients History and Physical, chart, labs and discussed the procedure including the risks, benefits and alternatives for the proposed anesthesia with the patient or authorized representative who has indicated his/her understanding and acceptance.   Dental Advisory Given  Plan Discussed with: CRNA and Surgeon  Anesthesia Plan Comments:         Anesthesia Quick Evaluation

## 2015-09-19 NOTE — Anesthesia Procedure Notes (Signed)
Procedure Name: Intubation Date/Time: 09/19/2015 12:11 PM Performed by: Lajuana Carry E Pre-anesthesia Checklist: Patient identified, Emergency Drugs available, Suction available and Patient being monitored Patient Re-evaluated:Patient Re-evaluated prior to inductionOxygen Delivery Method: Circle System Utilized Preoxygenation: Pre-oxygenation with 100% oxygen Intubation Type: IV induction Ventilation: Mask ventilation without difficulty Laryngoscope Size: Miller and 2 Grade View: Grade III Tube type: Oral Tube size: 7.0 mm Number of attempts: 1 Airway Equipment and Method: Bougie stylet Placement Confirmation: ETT inserted through vocal cords under direct vision,  positive ETCO2 and breath sounds checked- equal and bilateral Secured at: 22 cm Tube secured with: Tape Dental Injury: Teeth and Oropharynx as per pre-operative assessment  Comments: Easy mask a/w, atraumatic intubation, slightly anterior airway, limited view with Miller 2, no difficulty w/ bougie. Teeth/lips as pre op

## 2015-09-20 LAB — BASIC METABOLIC PANEL
ANION GAP: 7 (ref 5–15)
BUN: 16 mg/dL (ref 6–20)
CALCIUM: 8.8 mg/dL — AB (ref 8.9–10.3)
CO2: 27 mmol/L (ref 22–32)
Chloride: 101 mmol/L (ref 101–111)
Creatinine, Ser: 1.24 mg/dL — ABNORMAL HIGH (ref 0.44–1.00)
GFR, EST AFRICAN AMERICAN: 48 mL/min — AB (ref >60)
GFR, EST NON AFRICAN AMERICAN: 42 mL/min — AB (ref >60)
GLUCOSE: 170 mg/dL — AB (ref 65–99)
POTASSIUM: 4 mmol/L (ref 3.5–5.1)
Sodium: 135 mmol/L (ref 135–145)

## 2015-09-20 LAB — HEMOGLOBIN AND HEMATOCRIT, BLOOD
HEMATOCRIT: 37.9 % (ref 36.0–46.0)
Hemoglobin: 12.9 g/dL (ref 12.0–15.0)

## 2015-09-20 MED ORDER — DEXTROSE IN LACTATED RINGERS 5 % IV SOLN
INTRAVENOUS | Status: DC
  Administered 2015-09-20 – 2015-09-21 (×3): via INTRAVENOUS

## 2015-09-20 MED ORDER — SENNA 8.6 MG PO TABS
1.0000 | ORAL_TABLET | Freq: Two times a day (BID) | ORAL | Status: DC
  Administered 2015-09-20 – 2015-09-21 (×3): 8.6 mg via ORAL
  Filled 2015-09-20 (×3): qty 1

## 2015-09-20 MED ORDER — POLYETHYLENE GLYCOL 3350 17 G PO PACK
17.0000 g | PACK | Freq: Two times a day (BID) | ORAL | Status: DC | PRN
  Administered 2015-09-20 – 2015-09-21 (×2): 17 g via ORAL
  Filled 2015-09-20 (×2): qty 1

## 2015-09-20 MED ORDER — OXYCODONE HCL 5 MG PO TABS
5.0000 mg | ORAL_TABLET | ORAL | Status: DC | PRN
  Administered 2015-09-20 – 2015-09-21 (×7): 10 mg via ORAL
  Filled 2015-09-20 (×7): qty 2

## 2015-09-20 MED ORDER — BISACODYL 10 MG RE SUPP: 10.0000 mg | Freq: Every day | RECTAL | Status: DC | PRN

## 2015-09-20 MED ORDER — DOCUSATE SODIUM 100 MG PO CAPS
200.0000 mg | ORAL_CAPSULE | Freq: Two times a day (BID) | ORAL | Status: DC
  Administered 2015-09-20 – 2015-09-21 (×3): 200 mg via ORAL
  Filled 2015-09-20 (×3): qty 2

## 2015-09-20 MED ORDER — LEVOTHYROXINE SODIUM 100 MCG PO TABS
100.0000 ug | ORAL_TABLET | Freq: Every day | ORAL | Status: DC
  Administered 2015-09-20: 100 ug via ORAL
  Filled 2015-09-20: qty 1

## 2015-09-20 NOTE — Progress Notes (Addendum)
1 Day Post-Op Subjective: Patient reports concerns about getting constipated. No N/V, flatus or BMs. Pain well controlled. Has not ambulated in halls.   Objective: Vital signs in last 24 hours: Temp:  [97.5 F (36.4 C)-98.2 F (36.8 C)] 98 F (36.7 C) (12/03 0513) Pulse Rate:  [73-95] 92 (12/03 0513) Resp:  [11-20] 16 (12/03 0513) BP: (120-156)/(53-80) 120/60 mmHg (12/03 0513) SpO2:  [90 %-100 %] 96 % (12/03 0513) Weight:  [85.276 kg (188 lb)] 85.276 kg (188 lb) (12/02 1039)  Intake/Output from previous day: 12/02 0701 - 12/03 0700 In: 3392.1 [P.O.:240; I.V.:3152.1] Out: 250 [Urine:250] Intake/Output this shift:    Physical Exam:  General:alert and appears stated age GI: Soft, appropriately tender to palpation, incisions C/D/I, JP drain with SS output Female genitalia: foley catheter in place with clear yellow urine Extremities: extremities normal, atraumatic, no cyanosis or edema  Lab Results:  Recent Labs  09/19/15 1617 09/20/15 0610  HGB 13.8 12.9  HCT 40.8 37.9   BMET  Recent Labs  09/20/15 0610  NA 135  K 4.0  CL 101  CO2 27  GLUCOSE 170*  BUN 16  CREATININE 1.24*  CALCIUM 8.8*   No results for input(s): LABPT, INR in the last 72 hours. No results for input(s): LABURIN in the last 72 hours. No results found for this or any previous visit.  Studies/Results: No results found.  Assessment/Plan: 1 Day Post-Op Procedure(s) (LRB): ROBOT ASSITED LAPAROSCOPIC NEPHROURETERECTOMY WITH DIAPHRAMATIC HERNIA REPAIR (Left)   Advance to regular diet  Continue IVF @ 100 cc/hr given borderline UOP  Start colase & senna. PRN miralax & bisacodyl suppositories  Discharge with foley  F/u JP output  Ambulate in halls  Possible PM discharge vs tomorrow   With the patient doing better, she is finding a home today. I agree with the above note.  LOS: 1 day   Acie Fredrickson 09/20/2015, 8:28 AM

## 2015-09-20 NOTE — Op Note (Signed)
[name], [name] NO.:  [id number]  MEDICAL RECORD NO.:  [id number]  LOCATION:  [id number]                         FACILITY:  [facility]  PHYSICIAN:  Alexis Frock, MD     DATE OF BIRTH:  1941-01-16  DATE OF PROCEDURE: 09/19/2015                               OPERATIVE REPORT  DIAGNOSIS:  Large volume left renal pelvis cancer.  PROCEDURE: 1. Robotic-assisted laparoscopic left nephroureterectomy with bladder     cuff dissection. 2. Repair of small diaphragmatic hernia.  ESTIMATED BLOOD LOSS:  50 mL.  COMPLICATIONS:  None.  SPECIMEN:  Left kidney, ureter, and bladder cuff and block with stent in situ for permanent pathology.  FINDINGS: 1. Single artery, single vein left renovascular anatomy. 2. Small diaphragmatic hernia approximately dime size.  This was     containing small amount of retroperitoneal fat, this was closed     with running Prolene closure to prevent entrance of bowel through     this defect postoperatively.  DRAINS: 1. Jackson-Pratt drain to bulb suction. 2. Foley catheter straight drain.  ASSISTANT:  Debbrah Alar, PA  INDICATIONS:  Emily Maxwell is a very pleasant 74 year old lady who was found on workup for gross hematuria to have multifocal filling defects in her left renal pelvis, it was worrisome for possible urothelial carcinoma. She underwent operative evaluation with ureteroscopy which did corroborate a very large volume of papillary tumor.  This was not amenable to endoscopic resection completely.  Biopsy from this procedure revealed from a low-grade tumor.  She has normal contralateral kidney and excellent overall renal function.  No bladder lesions and this clinically localized disease.  Options were discussed for management including ablated therapies versus chemotherapy with and without radiation versus definitive nephroureterectomy with and without minimally invasive assistance and she adamantly wished to proceed with the latter.   Informed consent was obtained and placed in medical record.  PROCEDURE IN DETAIL:  The patient being Emily Maxwell, was verified. Procedure being left nephroureterectomy was confirmed.  Procedure was carried out.  Time-out was performed.  Intravenous antibiotics administered.  General endotracheal anesthesia introduced.  The patient placed into a left side up full flank position applying 15 degrees of stable flexion, superior arm elevator, axillary roll, sequential compression devices, bottom leg was bent, top leg straight and she was further fashioned to the operative table using 3-inch tape over foam padding across her supraxiphoid chest and across her pelvis.  Beanbag was also employed.  Foley catheter in place per urethra to straight drain.  This was a 3-way catheter with irrigation port plugged.  Sterile field was created by prepping and draping the patient's left flank and her abdomen from her xiphoid to her pubis using chlorhexidine gluconate. Next, a high-flow low-pressure pneumoperitoneum was obtained using Veress technique in the left lower quadrant having passed the aspiration and drop test.  Next, a 12-mm robotic camera port was placed in position approximately 1-1/2 handbreadth superior lateral to the umbilicus. Laparoscopic examination of the peritoneal cavity revealed no significant adhesions, no visceral injury.  Additional ports were then placed as follows:  Right subcostal 8-mm robotic port approximately 1 fingerbreadth below the costal margin at position 3  fingerbreadths superior to the camera port, right far lateral 8-mm robotic port approximately 4 fingerbreadths superior medial to the anterior iliac spine, a right inferior paramedian robotic port approximately 1 handbreadth superior to the pubic ramus.  A 12-mm assistant port in the midline 3 fingerbreadths above the camera port and a 12-mm assistant port in the midline just below the umbilicus.  Robot was docked  and passed through the electronic checks.  Initial attention was directed at the development of retroperitoneum.  Incision was made lateral to the descending colon from the area of the splenic flexure towards the area of the internal ring and the colon was carefully swept medially, lateral splenic attachments were taken down allowing the spleen to rotate medially and self-retract.  Lower pole of the kidney was identified, placed on gentle lateral traction.  Dissection proceeded medial to this. The gonadal vessels and ureter were encountered.  The ureter had a stent in situ as expected.  Fortunately there was minimal periureteral inflammation.  The ureter was placed on gentle lateral traction. Dissection proceeded in this triangle towards the area of renal hilum. The renal hilum consisted of single artery, single vein left renovascular anatomy, very proximally and a window to the structures was noted just at the aortic junction.  The hilum was controlled first through the artery using extra large clip proximally with a vascular load stapler distal to this and the vein with vascular load stapler, resulted in excellent hemostatic control of the hilum.  Dissection proceeded in this plane directly on the lateral edge of the aorta superiorly keeping the adrenal with the specimen and the medial attachments to the adrenal were controlled using vascular load stapler. The superior attachments were taken down as were lateral attachments. As the kidney was placed in gentle inferior traction after releasing superior attachments, there was noted to be a small diaphragmatic hernia approximately dime size.  There was retroperitoneal fat within this cavity that with removal and mobilization of the kidney exposed the small defect.  There was no evidence of pleural injury or pleural tissue visible through this.  It was felt that the repair would be warranted to prevent later strangulation or entrance of  bowel into this defect given that now this connects freely to the peritoneum and as such, 0 Prolene was carefully used to control the defect at the level of the diaphragmatic muscle taking great care to avoid any pleural injury which did not occur.  Attention was then directed at ureteral dissection, the ureter was carefully circumferentially mobilized towards the area of the iliac vessels.  At this point, a peritoneal flap was developed just lateral to the left medial umbilical ligament using the peritoneal flap to retract the sigmoid and pelvic contents away from the retroperitoneum and distal ureter.  The ureter was continued to be mobilized inferior to the iliacs through the ureterovesical junction.  Again, there was minimal desmoplastic reaction around this and no obvious extravesical disease whatsoever.  The ureterovesical junction was identified by tenting the ureter on superior traction, a stay stitch of 2-0 V-Loc was applied at the lateral aspect of this approximately 1 cm away from the edge of the ureter and bladder cuff dissection was performed by excising approximately 5-10 mm around the left ureteral orifice.  After placing extra large clip across the distal most ureter, this completely freed up at the left nephroureterectomy specimen, was placed in extra large EndoCatch bag for later retrieval.  The bladder defect was closed using the previous stay suture  to run over the bladder mucosa and the same suture was also used for second imbricating layer reapproximating over the small bladder defect, this resulted in excellent visible closure. Following these maneuvers, hemostasis appeared excellent.  All sponge and needle counts were correct.  Robot was then undocked.  Specimen was retrieved by extending the previous inferior-most midline assistant port site superiorly for approximately 1 handbreadth, this was above the umbilicus and removing the nephroureterectomy specimens and  setting aside for permanent pathology.  Closed suction drain was brought through the previous left lateral most robotic port site into the peritoneal cavity and the previous superior most assistant port site and the camera port site was closed with  fascia using 0 Vicryl.  All incision sites were infiltrated with dilute likewise Marcaine.  The extraction site was closed with fascia using figure-of-eight PDS x6 reapproximating Scarpa's running Vicryl and all incision sites were closed at level of skin using subcuticular Monocryl followed by Dermabond.  Procedure was then terminated.  The patient tolerated the procedure well.  There were no immediate periprocedural complications. The patient was taken to the postanesthesia care unit in stable condition.          ______________________________ Alexis Frock, MD     TM/MEDQ  D:  09/19/2015  T:  09/20/2015  Job:  [id number]

## 2015-09-20 NOTE — Progress Notes (Signed)
Pt had complaints of pressure in her bladder and having the urge to urinate with catheter. Pt had minimal urinary output. Bladder scanned pt with a result of 454 ml. MD notified and orders given. I instilled 40 ml of NS into the catheter and a small clot was expelled. The catheter then began to flow having an output of 500 ml of  Clear/ yellow urine. Pt tolerated this well. Will continue to monitor.  Aldona Bar RN

## 2015-09-21 LAB — BASIC METABOLIC PANEL
ANION GAP: 5 (ref 5–15)
BUN: 16 mg/dL (ref 6–20)
CALCIUM: 8.7 mg/dL — AB (ref 8.9–10.3)
CO2: 27 mmol/L (ref 22–32)
CREATININE: 1.22 mg/dL — AB (ref 0.44–1.00)
Chloride: 101 mmol/L (ref 101–111)
GFR calc Af Amer: 49 mL/min — ABNORMAL LOW (ref >60)
GFR calc non Af Amer: 43 mL/min — ABNORMAL LOW (ref >60)
GLUCOSE: 130 mg/dL — AB (ref 65–99)
Potassium: 3.7 mmol/L (ref 3.5–5.1)
Sodium: 133 mmol/L — ABNORMAL LOW (ref 135–145)

## 2015-09-21 LAB — CREATININE, FLUID (PLEURAL, PERITONEAL, JP DRAINAGE): Creat, Fluid: 1.3 mg/dL

## 2015-09-21 MED ORDER — SENNA 8.6 MG PO TABS: 2.0000 | ORAL_TABLET | Freq: Every day | ORAL | Status: DC

## 2015-09-21 MED ORDER — OXYCODONE-ACETAMINOPHEN 5-325 MG PO TABS: 1.0000 | ORAL_TABLET | ORAL | Status: DC | PRN

## 2015-09-21 MED ORDER — DOCUSATE SODIUM 100 MG PO CAPS: 200.0000 mg | ORAL_CAPSULE | Freq: Two times a day (BID) | ORAL | Status: DC

## 2015-09-21 MED ORDER — DOXYCYCLINE HYCLATE 50 MG PO CAPS: 50.0000 mg | ORAL_CAPSULE | Freq: Two times a day (BID) | ORAL | Status: DC

## 2015-09-21 NOTE — Progress Notes (Signed)
2 Days Post-Op Subjective: Patient doing well. Foley catheter became clogged overnight with bladder scan of 400 mL. Gentle irrigation with 40 cc and prompt drainage of 500 mL. Foley has been draining well since this time. Pain well controlled with oxycodone. Passing flatus.  No N/V or BMs. Ambulating in halls.    Objective: Vital signs in last 24 hours: Temp:  [97.7 F (36.5 C)-98.7 F (37.1 C)] 98.7 F (37.1 C) (12/04 0531) Pulse Rate:  [75-84] 75 (12/04 0531) Resp:  [16] 16 (12/04 0531) BP: (119-131)/(48-52) 131/48 mmHg (12/04 0531) SpO2:  [94 %] 94 % (12/04 0531)  Intake/Output from previous day: 12/03 0701 - 12/04 0700 In: 1633.3 [I.V.:1633.3] Out: 2520 [Urine:2475; Drains:45] Intake/Output this shift:    Physical Exam:  General:alert and appears stated age GI: Soft, appropriately tender to palpation, incisions C/D/I, JP drain with SS output Female genitalia: foley catheter in place with light pink/yellow urine without clot or debris Extremities: extremities normal, atraumatic, no cyanosis or edema  Lab Results:  Recent Labs  09/19/15 1617 09/20/15 0610  HGB 13.8 12.9  HCT 40.8 37.9   BMET  Recent Labs  09/20/15 0610 09/21/15 0540  NA 135 133*  K 4.0 3.7  CL 101 101  CO2 27 27  GLUCOSE 170* 130*  BUN 16 16  CREATININE 1.24* 1.22*  CALCIUM 8.8* 8.7*   No results for input(s): LABPT, INR in the last 72 hours. No results for input(s): LABURIN in the last 72 hours. No results found for this or any previous visit.  Studies/Results: No results found.  Assessment/Plan: 2 Days Post-Op Procedure(s) (LRB): ROBOT ASSITED LAPAROSCOPIC NEPHROURETERECTOMY WITH DIAPHRAMATIC HERNIA REPAIR (Left)   Continue regular diet  [place]  Scheduled olase & senna. PRN miralax & bisacodyl suppositories  Irrigated with 30 mL as needed for decreased UOP  F/u JP output  Send JP Cr  Ambulate in halls  If JP Cr equal to serum plan to d/c JP and d/c home with foley  catheter   LOS: 2 days   Acie Fredrickson 09/21/2015, 7:33 AM

## 2015-09-21 NOTE — Discharge Instructions (Signed)
1.  Activity:  You are encouraged to ambulate frequently (about every hour during waking hours) to help prevent blood clots from forming in your legs or lungs.  However, you should not engage in any heavy lifting (> 10-15 lbs), strenuous activity, or straining. 2. Diet: You should advance your diet as instructed by your physician.  It will be normal to have some bloating, nausea, and abdominal discomfort intermittently. 3. Prescriptions:  You will be provided a prescription for pain medication to take as needed.  If your pain is not severe enough to require the prescription pain medication, you may take extra strength Tylenol instead which will have less side effects.  You should also take a prescribed stool softener to avoid straining with bowel movements as the prescription pain medication may constipate you. 4. Incisions: You may remove your dressing bandages 48 hours after surgery if not removed in the hospital.  You will either have some small staples or special tissue glue at each of the incision sites. Once the bandages are removed (if present), the incisions may stay open to air.  You may start showering (but not soaking or bathing in water) the 2nd day after surgery and the incisions simply need to be patted dry after the shower.  No additional care is needed. 5. What to call us about: You should call the office [phone]) if you develop fever > 101 or develop persistent vomiting.  Instructions:  You may have been discharged with a foley catheter draining urine out of  your bladder. This consists of a rubber tube coming out of your urethra and connects to the drainage tubing and drainage bag. The tube is held in your bladder by a balloon which is inflated using water. Before discharge from the hospital, your nurse will instruct you how to care for your foley catheter. Other times, you may not have needed a catheter at all or your catheter may have been removed prior to discharge.   A foley  catheter drains your bladder by gravity. The drainage bag should always be below the level of your bladder. If you are wearing a leg bag, it should be below your waist, and at night, the bag should lay on the floor next to you in bed.   It is normal to see some blood in your urine after your procedure. This is often due to the foley catheter rubbing on the scab which is trying to form over your bladder incision.  However, if your urine is the color of tomato juice with quarter-sized clots, this can clog the catheter or prevent you from being able to urinate, and you should call us for instructions. A physician will likely need to evaluate you.  Sometimes, a piece of tissue, or a blood clot can get caught in the foley catheter and cause it not to drain properly. In this case, you may leak urine around the catheter and you may feel like your bladder is full. In this case, you should disconnect the catheter from the drainage bag and flush the catheter with ~30cc of saline (available at [facility]). If this doesn't help, you should come in to be evaluated.  Other times, even though your foley catheter is draining well, you have the feeling that you have to urinate, and you may leak around your catheter during painful bladder spasms. If this is the case, a medication called oxybutynin (or Ditropan) may help.  [facility] for fever >101.14F by mouth, uncontrolled nausea or vomiting, pain  uncontrolled by medication, foley catheter problems, decreased urine output; also, call for any new or concerning symptoms.

## 2015-09-21 NOTE — Progress Notes (Signed)
Patient discharged home with family, discharge instructions given and explained to patient/family and they verbalized understanding. Foley/leg  care/management at home demonstrated with teach back. Surgical incisions clean/dry/intact, no signs of  infection noted. Accompanied home by family, transported to the car by staff.

## 2015-09-21 NOTE — Progress Notes (Signed)
2 Days Post-Op Subjective: Patient reports that she is fairly comfortable. She has not passed flatus yet  Objective: Vital signs in last 24 hours: Temp:  [97.7 F (36.5 C)-98.3 F (36.8 C)] 98.3 F (36.8 C) (12/03 2126) Pulse Rate:  [77-92] 84 (12/03 2126) Resp:  [16] 16 (12/03 2126) BP: (119-120)/(51-60) 119/51 mmHg (12/03 2126) SpO2:  [94 %-96 %] 94 % (12/03 2126)  Intake/Output from previous day: 12/03 0701 - 12/04 0700 In: 933.3 [I.V.:933.3] Out: 1385 [Urine:1350; Drains:35] Intake/Output this shift: Total I/O In: 933.3 [I.V.:933.3] Out: 1065 [Urine:1050; Drains:15]  Physical Exam:  Constitutional: Vital signs reviewed. WD WN in NAD   Eyes: PERRL, No scleral icterus.   Cardiovascular: RRR Pulmonary/Chest: Normal effort Abdominal: Soft. No significant distention, minimally tender, appropriately so. Bowel sounds minimal. Genitourinary: Extremities: No cyanosis or edema   Lab Results:  Recent Labs  09/19/15 1617 09/20/15 0610  HGB 13.8 12.9  HCT 40.8 37.9   BMET  Recent Labs  09/20/15 0610  NA 135  K 4.0  CL 101  CO2 27  GLUCOSE 170*  BUN 16  CREATININE 1.24*  CALCIUM 8.8*   No results for input(s): LABPT, INR in the last 72 hours. No results for input(s): LABURIN in the last 72 hours. No results found for this or any previous visit.  Studies/Results: No results found.  Assessment/Plan:   Postoperative day #1 laparoscopic left nephroureterectomy ureterectomy, repair of diaphragmatic hernia. She appears to be doing fairly well. No significant bowel activity yet.    We will optimize her stool softeners, advance her diet as tolerated, ambulate.   LOS: 2 days   Franchot Gallo M 09/21/2015, 2:22 AM

## 2015-09-21 NOTE — Discharge Summary (Signed)
Physician Discharge Summary  Patient ID: Emily Maxwell MRN: [id number] DOB/AGE: 74-Dec-1942 74 y.o.  Admit date: 09/19/2015 Discharge date: 09/21/2015  Admission Diagnoses: renal mass  Discharge Diagnoses:  Active Problems:   Renal mass   Discharged Condition: good  Hospital Course:  74 yo female who is s/p left robotic nephroureterectomy with bladder cuff and diaphragmatic hernia repair with Dr. Tresa Moore. She did well post-operatively. Her diet was slowly advanced and at the time of discharge she was tolerating a regular diet, ambulating at her baseline, passing gas, foley was draining well and she was instructed how to perform gentle hand irrigation at home PRN decreased output via foley, and pain was well controlled with oral narcotics. JP creatinine was equal to serum on POD#2 and JP drain was removed. She was discharged to home on POD#2 with her foley catheter.  Consults: None  Significant Diagnostic Studies: labs: JP Creainine: 1.3  Treatments: surgery:  left robotic nephroureterectomy with bladder cuff and diaphragmatic hernia repair   Discharge Exam: Blood pressure 131/48, pulse 75, temperature 98.7 F (37.1 C), temperature source Oral, resp. rate 16, height 5' 4.25" (1.632 m), weight 85.276 kg (188 lb), last menstrual period 07/11/1983, SpO2 94 %. General appearance: alert, cooperative and appears stated age Head: Normocephalic, without obvious abnormality, atraumatic Resp: no increaseced WOB on RA GI: soft, non-distended, appropriately TTP, incisions without E/E/I. JP drain with scant SS output Pelvic: Foley catheter in place with light pink/yellow urine without clots or debris Extremities: extremities normal, atraumatic, no cyanosis or edema  Disposition: 01-Home or Self Care     Medication List    STOP taking these medications        AIRBORNE PO     ALIGN PO     B COMPLEX VITAMINS SL     Biotin 5 MG Caps     BLACK CURRANT SEED OIL PO     cholecalciferol 1000  UNITS tablet  Commonly known as:  VITAMIN D     Chromium 200 MCG Caps     CINNAMON PO     CITRACAL SLOW RELEASE PO     doxycycline 100 MG capsule  Commonly known as:  VIBRAMYCIN     Fish Oil 1000 MG Caps     Flax Seed Oil [id number] MG Caps     folic acid [id number] MCG tablet  Commonly known as:  FOLVITE     GLUCOSAMINE CHONDR 1500 COMPLX PO     multivitamin capsule     PANTOTHENIC ACID PO     TENUATE PO     TURMERIC PO     vitamin C 500 MG tablet  Commonly known as:  ASCORBIC ACID     vitamin E 1000 UNIT capsule      TAKE these medications        acetaminophen 325 MG tablet  Commonly known as:  TYLENOL  Take 650 mg by mouth every 6 (six) hours as needed for mild pain or headache.     cephALEXin 500 MG capsule  Commonly known as:  KEFLEX  Take 1 capsule (500 mg total) by mouth 2 (two) times daily. Start the day prior to your follow up appt with Dr. Tresa Moore     docusate sodium 100 MG capsule  Commonly known as:  COLACE  Take 2 capsules (200 mg total) by mouth 2 (two) times daily.     fexofenadine 180 MG tablet  Commonly known as:  ALLEGRA  Take 180 mg by mouth daily as needed for allergies. As  needed     guaiFENesin 600 MG 12 hr tablet  Commonly known as:  MUCINEX  Take 1,200 mg by mouth 2 (two) times daily as needed for cough or to loosen phlegm.     HYDROcodone-acetaminophen 5-325 MG tablet  Commonly known as:  NORCO  Take 1-2 tablets by mouth every 6 (six) hours as needed.     levalbuterol 45 MCG/ACT inhaler  Commonly known as:  XOPENEX HFA  Inhale 1-2 puffs into the lungs every 4 (four) hours as needed for wheezing.     levothyroxine 100 MCG tablet  Commonly known as:  SYNTHROID, LEVOTHROID  Take 100 mcg by mouth daily.     loratadine 10 MG tablet  Commonly known as:  CLARITIN  Take 10 mg by mouth daily.     montelukast 10 MG tablet  Commonly known as:  SINGULAIR  Take 10 mg by mouth daily as needed (cat allergy symptoms). As needed     PRESCRIPTION  MEDICATION  Allergy shots 2 x per week either or Tuesday, Wednesday, or Thursday     senna 8.6 MG Tabs tablet  Commonly known as:  SENOKOT  Take 2 tablets (17.2 mg total) by mouth at bedtime.     valsartan 80 MG tablet  Commonly known as:  DIOVAN  Take 80 mg by mouth daily. Patient takes in the am     ZETIA 10 MG tablet  Generic drug:  ezetimibe  Take 10 mg by mouth daily.           Follow-up Information    Follow up with Alexis Frock, MD On 09/29/2015.   Specialty:  Urology   Why:  at 11:15 for X-Ray, MD visit, and catheter removal. Dr. Tresa Moore will call you wtih pathology results when available.    Contact information:   [address] [place] [address] [phone]       Signed: Acie Fredrickson 09/21/2015, 11:56 AM

## 2015-09-23 NOTE — Anesthesia Postprocedure Evaluation (Signed)
Anesthesia Post Note  Patient: Emily Maxwell  Procedure(s) Performed: Procedure(s) (LRB): ROBOT ASSITED LAPAROSCOPIC NEPHROURETERECTOMY WITH DIAPHRAMATIC HERNIA REPAIR (Left)  Patient location during evaluation: PACU Anesthesia Type: General Level of consciousness: awake and alert Pain management: pain level controlled Vital Signs Assessment: post-procedure vital signs reviewed and stable Respiratory status: spontaneous breathing Cardiovascular status: blood pressure returned to baseline Anesthetic complications: no    Last Vitals:  Filed Vitals:   09/20/15 2126 09/21/15 0531  BP: 119/51 131/48  Pulse: 84 75  Temp: 36.8 C 37.1 C  Resp: 16 16    Last Pain:  Filed Vitals:   09/21/15 1450  PainSc: 8                  Tiajuana Amass

## 2015-09-29 DIAGNOSIS — C652 Malignant neoplasm of left renal pelvis: Secondary | ICD-10-CM | POA: Diagnosis not present

## 2015-09-29 DIAGNOSIS — Z905 Acquired absence of kidney: Secondary | ICD-10-CM | POA: Diagnosis not present

## 2015-10-30 DIAGNOSIS — E038 Other specified hypothyroidism: Secondary | ICD-10-CM | POA: Diagnosis not present

## 2015-11-06 DIAGNOSIS — R3915 Urgency of urination: Secondary | ICD-10-CM | POA: Diagnosis not present

## 2015-11-06 DIAGNOSIS — Z0000 Encounter for general adult medical examination without abnormal findings: Secondary | ICD-10-CM | POA: Diagnosis not present

## 2015-11-27 DIAGNOSIS — M5442 Lumbago with sciatica, left side: Secondary | ICD-10-CM | POA: Diagnosis not present

## 2015-12-10 DIAGNOSIS — M5442 Lumbago with sciatica, left side: Secondary | ICD-10-CM | POA: Diagnosis not present

## 2015-12-15 DIAGNOSIS — M5442 Lumbago with sciatica, left side: Secondary | ICD-10-CM | POA: Diagnosis not present

## 2015-12-18 DIAGNOSIS — M5442 Lumbago with sciatica, left side: Secondary | ICD-10-CM | POA: Diagnosis not present

## 2015-12-25 DIAGNOSIS — M9905 Segmental and somatic dysfunction of pelvic region: Secondary | ICD-10-CM | POA: Diagnosis not present

## 2015-12-25 DIAGNOSIS — M9903 Segmental and somatic dysfunction of lumbar region: Secondary | ICD-10-CM | POA: Diagnosis not present

## 2015-12-25 DIAGNOSIS — M9904 Segmental and somatic dysfunction of sacral region: Secondary | ICD-10-CM | POA: Diagnosis not present

## 2015-12-25 DIAGNOSIS — M5117 Intervertebral disc disorders with radiculopathy, lumbosacral region: Secondary | ICD-10-CM | POA: Diagnosis not present

## 2015-12-25 DIAGNOSIS — M25552 Pain in left hip: Secondary | ICD-10-CM | POA: Diagnosis not present

## 2016-01-05 DIAGNOSIS — B009 Herpesviral infection, unspecified: Secondary | ICD-10-CM | POA: Diagnosis not present

## 2016-01-08 DIAGNOSIS — M5117 Intervertebral disc disorders with radiculopathy, lumbosacral region: Secondary | ICD-10-CM | POA: Diagnosis not present

## 2016-01-08 DIAGNOSIS — M25552 Pain in left hip: Secondary | ICD-10-CM | POA: Diagnosis not present

## 2016-01-08 DIAGNOSIS — M9904 Segmental and somatic dysfunction of sacral region: Secondary | ICD-10-CM | POA: Diagnosis not present

## 2016-01-08 DIAGNOSIS — M9903 Segmental and somatic dysfunction of lumbar region: Secondary | ICD-10-CM | POA: Diagnosis not present

## 2016-01-08 DIAGNOSIS — M9905 Segmental and somatic dysfunction of pelvic region: Secondary | ICD-10-CM | POA: Diagnosis not present

## 2016-01-12 DIAGNOSIS — M25552 Pain in left hip: Secondary | ICD-10-CM | POA: Diagnosis not present

## 2016-01-12 DIAGNOSIS — M9903 Segmental and somatic dysfunction of lumbar region: Secondary | ICD-10-CM | POA: Diagnosis not present

## 2016-01-12 DIAGNOSIS — M9905 Segmental and somatic dysfunction of pelvic region: Secondary | ICD-10-CM | POA: Diagnosis not present

## 2016-01-12 DIAGNOSIS — M5117 Intervertebral disc disorders with radiculopathy, lumbosacral region: Secondary | ICD-10-CM | POA: Diagnosis not present

## 2016-01-12 DIAGNOSIS — M9904 Segmental and somatic dysfunction of sacral region: Secondary | ICD-10-CM | POA: Diagnosis not present

## 2016-01-13 DIAGNOSIS — M9904 Segmental and somatic dysfunction of sacral region: Secondary | ICD-10-CM | POA: Diagnosis not present

## 2016-01-13 DIAGNOSIS — M25552 Pain in left hip: Secondary | ICD-10-CM | POA: Diagnosis not present

## 2016-01-13 DIAGNOSIS — M5117 Intervertebral disc disorders with radiculopathy, lumbosacral region: Secondary | ICD-10-CM | POA: Diagnosis not present

## 2016-01-13 DIAGNOSIS — M9905 Segmental and somatic dysfunction of pelvic region: Secondary | ICD-10-CM | POA: Diagnosis not present

## 2016-01-13 DIAGNOSIS — M9903 Segmental and somatic dysfunction of lumbar region: Secondary | ICD-10-CM | POA: Diagnosis not present

## 2016-01-15 DIAGNOSIS — M25552 Pain in left hip: Secondary | ICD-10-CM | POA: Diagnosis not present

## 2016-01-15 DIAGNOSIS — M9905 Segmental and somatic dysfunction of pelvic region: Secondary | ICD-10-CM | POA: Diagnosis not present

## 2016-01-15 DIAGNOSIS — M5117 Intervertebral disc disorders with radiculopathy, lumbosacral region: Secondary | ICD-10-CM | POA: Diagnosis not present

## 2016-01-15 DIAGNOSIS — M9903 Segmental and somatic dysfunction of lumbar region: Secondary | ICD-10-CM | POA: Diagnosis not present

## 2016-01-15 DIAGNOSIS — M9904 Segmental and somatic dysfunction of sacral region: Secondary | ICD-10-CM | POA: Diagnosis not present

## 2016-01-19 DIAGNOSIS — M9904 Segmental and somatic dysfunction of sacral region: Secondary | ICD-10-CM | POA: Diagnosis not present

## 2016-01-19 DIAGNOSIS — M9903 Segmental and somatic dysfunction of lumbar region: Secondary | ICD-10-CM | POA: Diagnosis not present

## 2016-01-19 DIAGNOSIS — M9905 Segmental and somatic dysfunction of pelvic region: Secondary | ICD-10-CM | POA: Diagnosis not present

## 2016-01-19 DIAGNOSIS — M5117 Intervertebral disc disorders with radiculopathy, lumbosacral region: Secondary | ICD-10-CM | POA: Diagnosis not present

## 2016-01-19 DIAGNOSIS — M25552 Pain in left hip: Secondary | ICD-10-CM | POA: Diagnosis not present

## 2016-01-20 DIAGNOSIS — M9904 Segmental and somatic dysfunction of sacral region: Secondary | ICD-10-CM | POA: Diagnosis not present

## 2016-01-20 DIAGNOSIS — M25552 Pain in left hip: Secondary | ICD-10-CM | POA: Diagnosis not present

## 2016-01-20 DIAGNOSIS — M9903 Segmental and somatic dysfunction of lumbar region: Secondary | ICD-10-CM | POA: Diagnosis not present

## 2016-01-20 DIAGNOSIS — M5117 Intervertebral disc disorders with radiculopathy, lumbosacral region: Secondary | ICD-10-CM | POA: Diagnosis not present

## 2016-01-20 DIAGNOSIS — M9905 Segmental and somatic dysfunction of pelvic region: Secondary | ICD-10-CM | POA: Diagnosis not present

## 2016-01-22 DIAGNOSIS — M25552 Pain in left hip: Secondary | ICD-10-CM | POA: Diagnosis not present

## 2016-01-22 DIAGNOSIS — M9905 Segmental and somatic dysfunction of pelvic region: Secondary | ICD-10-CM | POA: Diagnosis not present

## 2016-01-22 DIAGNOSIS — M5117 Intervertebral disc disorders with radiculopathy, lumbosacral region: Secondary | ICD-10-CM | POA: Diagnosis not present

## 2016-01-22 DIAGNOSIS — M9903 Segmental and somatic dysfunction of lumbar region: Secondary | ICD-10-CM | POA: Diagnosis not present

## 2016-01-22 DIAGNOSIS — M9904 Segmental and somatic dysfunction of sacral region: Secondary | ICD-10-CM | POA: Diagnosis not present

## 2016-01-27 DIAGNOSIS — M5117 Intervertebral disc disorders with radiculopathy, lumbosacral region: Secondary | ICD-10-CM | POA: Diagnosis not present

## 2016-01-27 DIAGNOSIS — M9904 Segmental and somatic dysfunction of sacral region: Secondary | ICD-10-CM | POA: Diagnosis not present

## 2016-01-27 DIAGNOSIS — M9903 Segmental and somatic dysfunction of lumbar region: Secondary | ICD-10-CM | POA: Diagnosis not present

## 2016-01-27 DIAGNOSIS — M9905 Segmental and somatic dysfunction of pelvic region: Secondary | ICD-10-CM | POA: Diagnosis not present

## 2016-01-27 DIAGNOSIS — M25552 Pain in left hip: Secondary | ICD-10-CM | POA: Diagnosis not present

## 2016-01-28 DIAGNOSIS — L820 Inflamed seborrheic keratosis: Secondary | ICD-10-CM | POA: Diagnosis not present

## 2016-01-28 DIAGNOSIS — L738 Other specified follicular disorders: Secondary | ICD-10-CM | POA: Diagnosis not present

## 2016-01-28 DIAGNOSIS — D485 Neoplasm of uncertain behavior of skin: Secondary | ICD-10-CM | POA: Diagnosis not present

## 2016-01-29 DIAGNOSIS — M9904 Segmental and somatic dysfunction of sacral region: Secondary | ICD-10-CM | POA: Diagnosis not present

## 2016-01-29 DIAGNOSIS — M9903 Segmental and somatic dysfunction of lumbar region: Secondary | ICD-10-CM | POA: Diagnosis not present

## 2016-01-29 DIAGNOSIS — M9905 Segmental and somatic dysfunction of pelvic region: Secondary | ICD-10-CM | POA: Diagnosis not present

## 2016-01-29 DIAGNOSIS — M25552 Pain in left hip: Secondary | ICD-10-CM | POA: Diagnosis not present

## 2016-01-29 DIAGNOSIS — M5117 Intervertebral disc disorders with radiculopathy, lumbosacral region: Secondary | ICD-10-CM | POA: Diagnosis not present

## 2016-02-02 ENCOUNTER — Other Ambulatory Visit: Payer: Self-pay | Admitting: *Deleted

## 2016-02-02 MED ORDER — MONTELUKAST SODIUM 10 MG PO TABS: 10.0000 mg | ORAL_TABLET | Freq: Every day | ORAL | Status: DC

## 2016-02-03 DIAGNOSIS — M9905 Segmental and somatic dysfunction of pelvic region: Secondary | ICD-10-CM | POA: Diagnosis not present

## 2016-02-03 DIAGNOSIS — M9903 Segmental and somatic dysfunction of lumbar region: Secondary | ICD-10-CM | POA: Diagnosis not present

## 2016-02-03 DIAGNOSIS — M25552 Pain in left hip: Secondary | ICD-10-CM | POA: Diagnosis not present

## 2016-02-03 DIAGNOSIS — M5117 Intervertebral disc disorders with radiculopathy, lumbosacral region: Secondary | ICD-10-CM | POA: Diagnosis not present

## 2016-02-03 DIAGNOSIS — M9904 Segmental and somatic dysfunction of sacral region: Secondary | ICD-10-CM | POA: Diagnosis not present

## 2016-02-05 DIAGNOSIS — M5117 Intervertebral disc disorders with radiculopathy, lumbosacral region: Secondary | ICD-10-CM | POA: Diagnosis not present

## 2016-02-05 DIAGNOSIS — M9904 Segmental and somatic dysfunction of sacral region: Secondary | ICD-10-CM | POA: Diagnosis not present

## 2016-02-05 DIAGNOSIS — M9905 Segmental and somatic dysfunction of pelvic region: Secondary | ICD-10-CM | POA: Diagnosis not present

## 2016-02-05 DIAGNOSIS — M25552 Pain in left hip: Secondary | ICD-10-CM | POA: Diagnosis not present

## 2016-02-05 DIAGNOSIS — M9903 Segmental and somatic dysfunction of lumbar region: Secondary | ICD-10-CM | POA: Diagnosis not present

## 2016-02-11 DIAGNOSIS — M9904 Segmental and somatic dysfunction of sacral region: Secondary | ICD-10-CM | POA: Diagnosis not present

## 2016-02-11 DIAGNOSIS — M9905 Segmental and somatic dysfunction of pelvic region: Secondary | ICD-10-CM | POA: Diagnosis not present

## 2016-02-11 DIAGNOSIS — M5117 Intervertebral disc disorders with radiculopathy, lumbosacral region: Secondary | ICD-10-CM | POA: Diagnosis not present

## 2016-02-11 DIAGNOSIS — M25552 Pain in left hip: Secondary | ICD-10-CM | POA: Diagnosis not present

## 2016-02-11 DIAGNOSIS — M9903 Segmental and somatic dysfunction of lumbar region: Secondary | ICD-10-CM | POA: Diagnosis not present

## 2016-02-24 DIAGNOSIS — M9905 Segmental and somatic dysfunction of pelvic region: Secondary | ICD-10-CM | POA: Diagnosis not present

## 2016-02-24 DIAGNOSIS — M9903 Segmental and somatic dysfunction of lumbar region: Secondary | ICD-10-CM | POA: Diagnosis not present

## 2016-02-24 DIAGNOSIS — M9904 Segmental and somatic dysfunction of sacral region: Secondary | ICD-10-CM | POA: Diagnosis not present

## 2016-02-24 DIAGNOSIS — M5117 Intervertebral disc disorders with radiculopathy, lumbosacral region: Secondary | ICD-10-CM | POA: Diagnosis not present

## 2016-02-24 DIAGNOSIS — M25552 Pain in left hip: Secondary | ICD-10-CM | POA: Diagnosis not present

## 2016-03-02 DIAGNOSIS — M25552 Pain in left hip: Secondary | ICD-10-CM | POA: Diagnosis not present

## 2016-03-02 DIAGNOSIS — M9903 Segmental and somatic dysfunction of lumbar region: Secondary | ICD-10-CM | POA: Diagnosis not present

## 2016-03-02 DIAGNOSIS — M9904 Segmental and somatic dysfunction of sacral region: Secondary | ICD-10-CM | POA: Diagnosis not present

## 2016-03-02 DIAGNOSIS — M9905 Segmental and somatic dysfunction of pelvic region: Secondary | ICD-10-CM | POA: Diagnosis not present

## 2016-03-02 DIAGNOSIS — M5117 Intervertebral disc disorders with radiculopathy, lumbosacral region: Secondary | ICD-10-CM | POA: Diagnosis not present

## 2016-03-17 DIAGNOSIS — M9904 Segmental and somatic dysfunction of sacral region: Secondary | ICD-10-CM | POA: Diagnosis not present

## 2016-03-17 DIAGNOSIS — M9905 Segmental and somatic dysfunction of pelvic region: Secondary | ICD-10-CM | POA: Diagnosis not present

## 2016-03-17 DIAGNOSIS — M5117 Intervertebral disc disorders with radiculopathy, lumbosacral region: Secondary | ICD-10-CM | POA: Diagnosis not present

## 2016-03-17 DIAGNOSIS — M9903 Segmental and somatic dysfunction of lumbar region: Secondary | ICD-10-CM | POA: Diagnosis not present

## 2016-03-17 DIAGNOSIS — M25552 Pain in left hip: Secondary | ICD-10-CM | POA: Diagnosis not present

## 2016-03-25 ENCOUNTER — Other Ambulatory Visit: Payer: Self-pay | Admitting: Urology

## 2016-03-25 ENCOUNTER — Ambulatory Visit (HOSPITAL_COMMUNITY)
Admission: RE | Admit: 2016-03-25 | Discharge: 2016-03-25 | Disposition: A | Discharge status: Home / Self Care | Payer: Medicare Other | Source: Ambulatory Visit | Attending: Urology | Admitting: Urology

## 2016-03-25 DIAGNOSIS — C642 Malignant neoplasm of left kidney, except renal pelvis: Secondary | ICD-10-CM | POA: Diagnosis not present

## 2016-03-25 DIAGNOSIS — K5730 Diverticulosis of large intestine without perforation or abscess without bleeding: Secondary | ICD-10-CM | POA: Diagnosis not present

## 2016-03-25 DIAGNOSIS — C652 Malignant neoplasm of left renal pelvis: Secondary | ICD-10-CM | POA: Insufficient documentation

## 2016-03-25 DIAGNOSIS — R911 Solitary pulmonary nodule: Secondary | ICD-10-CM | POA: Insufficient documentation

## 2016-03-25 DIAGNOSIS — Z8551 Personal history of malignant neoplasm of bladder: Secondary | ICD-10-CM | POA: Diagnosis not present

## 2016-04-01 DIAGNOSIS — Z8551 Personal history of malignant neoplasm of bladder: Secondary | ICD-10-CM | POA: Diagnosis not present

## 2016-07-15 DIAGNOSIS — E784 Other hyperlipidemia: Secondary | ICD-10-CM | POA: Diagnosis not present

## 2016-07-15 DIAGNOSIS — R7301 Impaired fasting glucose: Secondary | ICD-10-CM | POA: Diagnosis not present

## 2016-07-15 DIAGNOSIS — E038 Other specified hypothyroidism: Secondary | ICD-10-CM | POA: Diagnosis not present

## 2016-07-15 DIAGNOSIS — I10 Essential (primary) hypertension: Secondary | ICD-10-CM | POA: Diagnosis not present

## 2016-07-22 DIAGNOSIS — C652 Malignant neoplasm of left renal pelvis: Secondary | ICD-10-CM | POA: Diagnosis not present

## 2016-07-22 DIAGNOSIS — M859 Disorder of bone density and structure, unspecified: Secondary | ICD-10-CM | POA: Diagnosis not present

## 2016-07-22 DIAGNOSIS — E668 Other obesity: Secondary | ICD-10-CM | POA: Diagnosis not present

## 2016-07-22 DIAGNOSIS — Z0000 Encounter for general adult medical examination without abnormal findings: Secondary | ICD-10-CM | POA: Diagnosis not present

## 2016-07-22 DIAGNOSIS — I712 Thoracic aortic aneurysm, without rupture: Secondary | ICD-10-CM | POA: Diagnosis not present

## 2016-07-22 DIAGNOSIS — E784 Other hyperlipidemia: Secondary | ICD-10-CM | POA: Diagnosis not present

## 2016-07-22 DIAGNOSIS — Z1389 Encounter for screening for other disorder: Secondary | ICD-10-CM | POA: Diagnosis not present

## 2016-07-22 DIAGNOSIS — Z6835 Body mass index (BMI) 35.0-35.9, adult: Secondary | ICD-10-CM | POA: Diagnosis not present

## 2016-07-22 DIAGNOSIS — E890 Postprocedural hypothyroidism: Secondary | ICD-10-CM | POA: Diagnosis not present

## 2016-07-22 DIAGNOSIS — Z23 Encounter for immunization: Secondary | ICD-10-CM | POA: Diagnosis not present

## 2016-07-22 DIAGNOSIS — I10 Essential (primary) hypertension: Secondary | ICD-10-CM | POA: Diagnosis not present

## 2016-07-22 DIAGNOSIS — R7301 Impaired fasting glucose: Secondary | ICD-10-CM | POA: Diagnosis not present

## 2016-07-23 DIAGNOSIS — Z1212 Encounter for screening for malignant neoplasm of rectum: Secondary | ICD-10-CM | POA: Diagnosis not present

## 2016-07-26 DIAGNOSIS — I712 Thoracic aortic aneurysm, without rupture: Secondary | ICD-10-CM | POA: Diagnosis not present

## 2016-07-26 DIAGNOSIS — J439 Emphysema, unspecified: Secondary | ICD-10-CM | POA: Diagnosis not present

## 2016-07-28 DIAGNOSIS — L570 Actinic keratosis: Secondary | ICD-10-CM | POA: Diagnosis not present

## 2016-07-28 DIAGNOSIS — L219 Seborrheic dermatitis, unspecified: Secondary | ICD-10-CM | POA: Diagnosis not present

## 2016-08-05 DIAGNOSIS — H35372 Puckering of macula, left eye: Secondary | ICD-10-CM | POA: Diagnosis not present

## 2016-08-31 ENCOUNTER — Encounter: Payer: Self-pay | Admitting: Allergy and Immunology

## 2016-08-31 ENCOUNTER — Ambulatory Visit (INDEPENDENT_AMBULATORY_CARE_PROVIDER_SITE_OTHER): Payer: Medicare Other | Admitting: Allergy and Immunology

## 2016-08-31 VITALS — BP 132/82 | HR 80 | Resp 16 | Ht 63.3 in | Wt 202.0 lb

## 2016-08-31 DIAGNOSIS — Z8719 Personal history of other diseases of the digestive system: Secondary | ICD-10-CM

## 2016-08-31 DIAGNOSIS — J4521 Mild intermittent asthma with (acute) exacerbation: Secondary | ICD-10-CM | POA: Diagnosis not present

## 2016-08-31 DIAGNOSIS — J3089 Other allergic rhinitis: Secondary | ICD-10-CM | POA: Diagnosis not present

## 2016-08-31 MED ORDER — METHYLPREDNISOLONE ACETATE 80 MG/ML IJ SUSP
80.0000 mg | Freq: Once | INTRAMUSCULAR | Status: AC
  Administered 2016-08-31: 80 mg via INTRAMUSCULAR

## 2016-08-31 MED ORDER — FLUTICASONE FUROATE 200 MCG/ACT IN AEPB
1.0000 | INHALATION_SPRAY | Freq: Every day | RESPIRATORY_TRACT | 5 refills | Status: DC
Start: 2016-08-31 — End: 2018-01-27

## 2016-08-31 MED ORDER — LEVALBUTEROL TARTRATE 45 MCG/ACT IN AERO: 1.0000 | INHALATION_SPRAY | RESPIRATORY_TRACT | 0 refills | Status: DC | PRN

## 2016-08-31 NOTE — Progress Notes (Signed)
Follow-up Note  Referring Provider: Marton Redwood, MD Primary Provider: Marton Redwood, MD Date of Office Visit: 08/31/2016  Subjective:   Emily Maxwell (DOB: 24-Jun-1941) is a 75 y.o. female who returns to [facility] on 08/[facility]/2016 in re-evaluation of the following:  HPI: Emily Maxwell presents to this clinic in reevaluation of her asthma and allergic rhinitis and LPR. I've not seen her in his clinic since July 2016.  Over the course of the past 2 months she has developed some problems with asthma. She's had a significant cough over the course of the past 3 weeks or so. She does not use a short acting bronchodilator. She has not had a tremendous amount of shortness of breath or chest pain or sputum production. She has had postnasal drip which has been a consistent issue of many years duration. She's had a little bit more nasal congestion. She has no fever or ugly nasal discharge or anosmia. She added Singulair 2 weeks ago which really did not help her symptoms very much. She continues on an antihistamine.  She has not been having any issues with reflux. She does not use any treatment for reflux at this point in time.  She was diagnosed with transitional cell carcinoma requiring a left nephrectomy in December 2016. Around that point in time she had to discontinue her immunotherapy because of a logistical issue. Apparently this is a localized TCC and all her follow up scans have been negative for metastasis.    Medication List      acetaminophen 325 MG tablet Commonly known as:  TYLENOL Take 650 mg by mouth every 6 (six) hours as needed for mild pain or headache.   docusate sodium 100 MG capsule Commonly known as:  COLACE Take 2 capsules (200 mg total) by mouth 2 (two) times daily.   doxycycline 100 MG capsule Commonly known as:  VIBRAMYCIN   fexofenadine 180 MG tablet Commonly known as:  ALLEGRA Take 180 mg by mouth daily as needed for allergies. As needed     guaiFENesin 600 MG 12 hr tablet Commonly known as:  MUCINEX Take 1,200 mg by mouth 2 (two) times daily as needed for cough or to loosen phlegm.   levalbuterol 45 MCG/ACT inhaler Commonly known as:  XOPENEX HFA Inhale 1-2 puffs into the lungs every 4 (four) hours as needed for wheezing.   levothyroxine 100 MCG tablet Commonly known as:  SYNTHROID, LEVOTHROID Take 100 mcg by mouth daily.   loratadine 10 MG tablet Commonly known as:  CLARITIN Take 10 mg by mouth daily.   montelukast 10 MG tablet Commonly known as:  SINGULAIR Take 1 tablet (10 mg total) by mouth at bedtime. As needed   PRESCRIPTION MEDICATION Allergy shots 2 x per week either or Tuesday, Wednesday, or Thursday   valsartan 80 MG tablet Commonly known as:  DIOVAN Take 80 mg by mouth daily. Patient takes in the am   ZETIA 10 MG tablet Generic drug:  ezetimibe Take 10 mg by mouth daily.       Past Medical History:  Diagnosis Date  . Aneurysm, ascending aorta ([place]) 2013   64mm  . Anxiety    hx of  anxiety , had stress test several years ago per patient that was normal   . Arthritis    thumbs , oa  . Ascending aorta dilation ([place]) 08/07/2012  . Asthma   . Asthma   . Cancer [facility])    cancer left kidney  .  Cancer of kidney [facility])    Had right kidney removed  . Complication of anesthesia    pulse rate slow in past, did ok with recent anesthesia  . Deaf    left ear  . Dysrhythmia    occasional arrythmia, no meds taken for  . Hematuria    no blood seen recently  . Hematuria   . Hyperlipidemia   . Hyperplastic colon polyp   . Hypertension   . Hypothyroidism   . IFG (impaired fasting glucose)   . Nodule of left lung    stable, surveillance  Dr Brigitte Pulse  . Numbness    left thigh, when walking or standing for long periods of time  . Osteopenia   . RLS (restless legs syndrome)   . Rosacea   . Simple endometrial hyperplasia years ago  . Thoracic aortic aneurysm ([place])    stable per last chest ct 07-11-14  [facility] on chart-   . Vasovagal reaction    hx of     Past Surgical History:  Procedure Laterality Date  . acoustic neuroma S/P resection Lt ear deafness 1990     tinnitus  from left ear  . BTL    . bumonectomy Bilateral    x 2  . CATARACT EXTRACTION Bilateral 2004  . CRANIECTOMY FOR EXCISION OF ACOUSTIC NEUROMA Left 1990   Deaf on Left side  . CYSTOSCOPY WITH RETROGRADE PYELOGRAM, URETEROSCOPY AND STENT PLACEMENT Bilateral 08/07/2015   Procedure: CYSTOSCOPY WITH BILATERAL RETROGRADE PYELOGRAM, LEFT URETEROSCOPY WITH BIOPSY AND  LEFT STENT PLACEMENT;  Surgeon: Alexis Frock, MD;  Location: [facility];  Service: Urology;  Laterality: Bilateral;  . EYE SURGERY Bilateral 2013   eyelid drooping fixed  . EYE SURGERY Bilateral    yag procedure after cataracts  . left hand middle finger surgery   2015    cadaver bone placed   . left shoulder surgery 2005    . Lt knee surgery 2008 Left 2008   tears twice one surgery  . lt wrist surgery 2005    . mortone neuroma  2010   left foot 3rd and 4th toe  . ROBOT ASSITED LAPAROSCOPIC NEPHROURETERECTOMY Left 09/19/2015   Procedure: ROBOT ASSITED LAPAROSCOPIC NEPHROURETERECTOMY WITH DIAPHRAMATIC HERNIA REPAIR;  Surgeon: Alexis Frock, MD;  Location: [facility];  Service: Urology;  Laterality: Left;  . SP CHOLECYSTOMY    . THYROIDECTOMY  11/2009   S/P Adenoma follicular vs hyperplastic  . TONSILLECTOMY  age 53  . TUBAL LIGATION  1976  . WRIST SURGERY Left 2007   wrist cyst removed    Allergies  Allergen Reactions  . Avelox [Moxifloxacin Hcl In Nacl] Other (See Comments)    Unable to describe reaction  . Epinephrine Other (See Comments)    Makes heart rate increase and cold sweats   . Tramadol Other (See Comments)    Unable to describe reaction  . Penicillins Rash    Childhood rxn from weekly PCN shots w/ asthma Tx Has patient had a PCN reaction causing immediate rash, facial/tongue/throat swelling, SOB or lightheadedness with hypotension:  unknown Has patient had a PCN reaction causing severe rash involving mucus membranes or skin necrosis: unknown Has patient had a PCN reaction that required hospitalization: no   Has patient had a PCN reaction occurring within the last 10 years: no If all of the above answers are "NO", then may proceed with Cephalosporin  . Sulfa Antibiotics Rash    Tolerates non-antibiotic sulfa drugs    Review of systems  negative except as noted in HPI / PMHx or noted below:  Review of Systems  Constitutional: Negative.   HENT: Negative.   Eyes: Negative.   Respiratory: Negative.   Cardiovascular: Negative.   Gastrointestinal: Negative.   Genitourinary: Negative.   Musculoskeletal: Negative.   Skin: Negative.   Neurological: Negative.   Endo/Heme/Allergies: Negative.   Psychiatric/Behavioral: Negative.      Objective:   Vitals:   08/31/16 1209  BP: 132/82  Pulse: 80  Resp: 16   Height: 5' 3.3" (160.8 cm)  Weight: 202 lb (91.6 kg)   Physical Exam  Constitutional: She is well-developed, well-nourished, and in no distress.  HENT:  Head: Normocephalic.  Right Ear: Tympanic membrane, external ear and ear canal normal.  Left Ear: Tympanic membrane, external ear and ear canal normal.  Nose: Nose normal. No mucosal edema or rhinorrhea.  Mouth/Throat: Uvula is midline, oropharynx is clear and moist and mucous membranes are normal. No oropharyngeal exudate.  Eyes: Conjunctivae are normal.  Neck: Trachea normal. No tracheal tenderness present. No tracheal deviation present. No thyromegaly present.  Cardiovascular: Normal rate, regular rhythm, S1 normal, S2 normal and normal heart sounds.   No murmur heard. Pulmonary/Chest: Breath sounds normal. No stridor. No respiratory distress. She has no wheezes. She has no rales.  Musculoskeletal: She exhibits no edema.  Lymphadenopathy:       Head (right side): No tonsillar adenopathy present.       Head (left side): No tonsillar adenopathy present.      She has no cervical adenopathy.  Neurological: She is alert. Gait normal.  Skin: No rash noted. She is not diaphoretic. No erythema. Nails show no clubbing.  Psychiatric: Mood and affect normal.    Diagnostics:    Spirometry was performed and demonstrated an FEV1 of 2.11 at 105 % of predicted.  Assessment and Plan:   1. Asthma, not well controlled, mild intermittent, with acute exacerbation   2. Other allergic rhinitis   3. History of esophageal reflux     1. Treat inflammation:   A. Depo-Medrol 80 IM delivered in clinic  B. Start Arnuity 200 one inhalation 1 time per day  C. start OTC Rhinocort one spray each nostril one time per day  2. If needed:   A. OTC antihistamine one time per day  B. Xopenex HFA 2 puffs every 4-6 hours  3. Can restart immunotherapy  4. Return to clinic in 3 weeks or earlier if problem  Emily Maxwell will use anti-inflammatory agents including a systemic steroid to get rid of any respiratory tract inflammation that is present. She should respond within a week or 2 and I would like to see her back in this clinic in 3 weeks to make sure that we are going down the right road for improvement. If she does not respond to this therapy then we need to consider other etiologic agents contributing to her respiratory tract symptoms and with her history of TCC she certainly will require an imaging procedures of her chest. We also need to consider the possibility that she has redeveloped LPR. She can restart her immunotherapy.  Allena Katz, MD [facility]

## 2016-08-31 NOTE — Patient Instructions (Addendum)
  1. Treat inflammation:   A. Depo-Medrol 80 IM delivered in clinic  B. Start Arnuity 200 one inhalation 1 time per day  C. start OTC Rhinocort one spray each nostril one time per day  2. If needed:   A. OTC antihistamine one time per day  B. Xopenex HFA 2 puffs every 4-6 hours  3. Can restart immunotherapy  4. Return to clinic in 3 weeks or earlier if problem

## 2016-09-02 DIAGNOSIS — M65341 Trigger finger, right ring finger: Secondary | ICD-10-CM | POA: Diagnosis not present

## 2016-09-14 DIAGNOSIS — J3089 Other allergic rhinitis: Secondary | ICD-10-CM

## 2016-09-15 DIAGNOSIS — J301 Allergic rhinitis due to pollen: Secondary | ICD-10-CM | POA: Diagnosis not present

## 2016-09-21 ENCOUNTER — Ambulatory Visit (INDEPENDENT_AMBULATORY_CARE_PROVIDER_SITE_OTHER): Payer: Medicare Other | Admitting: *Deleted

## 2016-09-21 DIAGNOSIS — J3089 Other allergic rhinitis: Secondary | ICD-10-CM

## 2016-09-22 NOTE — Progress Notes (Signed)
Immunotherapy   Patient Details  Name: Kandise Sessums MRN: [id number] Date of Birth: 08/27/1941  09/22/2016  Ermalinda Barrios started injections for  [place]. Following schedule: B  Frequency:1-2 times per week. Epi-Pen:Epi-Pen Available  Consent signed and patient instructions given. No problems after 30 minutes in the office.    Constance Holster 09/22/2016, 10:29 AM

## 2016-09-28 ENCOUNTER — Ambulatory Visit (INDEPENDENT_AMBULATORY_CARE_PROVIDER_SITE_OTHER): Payer: Medicare Other | Admitting: *Deleted

## 2016-09-28 DIAGNOSIS — J3089 Other allergic rhinitis: Secondary | ICD-10-CM

## 2016-10-01 DIAGNOSIS — R310 Gross hematuria: Secondary | ICD-10-CM | POA: Diagnosis not present

## 2016-10-01 DIAGNOSIS — C642 Malignant neoplasm of left kidney, except renal pelvis: Secondary | ICD-10-CM | POA: Diagnosis not present

## 2016-10-04 DIAGNOSIS — M65341 Trigger finger, right ring finger: Secondary | ICD-10-CM | POA: Diagnosis not present

## 2016-10-05 ENCOUNTER — Ambulatory Visit (INDEPENDENT_AMBULATORY_CARE_PROVIDER_SITE_OTHER): Payer: Medicare Other

## 2016-10-05 DIAGNOSIS — J3089 Other allergic rhinitis: Secondary | ICD-10-CM | POA: Diagnosis not present

## 2016-10-07 DIAGNOSIS — C642 Malignant neoplasm of left kidney, except renal pelvis: Secondary | ICD-10-CM | POA: Diagnosis not present

## 2016-10-07 DIAGNOSIS — Z905 Acquired absence of kidney: Secondary | ICD-10-CM | POA: Diagnosis not present

## 2016-10-07 DIAGNOSIS — R310 Gross hematuria: Secondary | ICD-10-CM | POA: Diagnosis not present

## 2016-10-26 ENCOUNTER — Ambulatory Visit (INDEPENDENT_AMBULATORY_CARE_PROVIDER_SITE_OTHER): Payer: Medicare Other | Admitting: *Deleted

## 2016-10-26 DIAGNOSIS — J3089 Other allergic rhinitis: Secondary | ICD-10-CM

## 2016-10-26 DIAGNOSIS — J309 Allergic rhinitis, unspecified: Secondary | ICD-10-CM

## 2016-11-02 ENCOUNTER — Ambulatory Visit: Payer: Medicare Other | Admitting: Allergy and Immunology

## 2016-11-09 ENCOUNTER — Ambulatory Visit (INDEPENDENT_AMBULATORY_CARE_PROVIDER_SITE_OTHER): Payer: Medicare Other | Admitting: *Deleted

## 2016-11-09 DIAGNOSIS — J309 Allergic rhinitis, unspecified: Secondary | ICD-10-CM

## 2016-11-09 NOTE — Addendum Note (Signed)
Addended by: Felipa Emory on: 11/09/2016 11:49 AM   Modules accepted: Orders

## 2016-11-18 ENCOUNTER — Ambulatory Visit (INDEPENDENT_AMBULATORY_CARE_PROVIDER_SITE_OTHER): Payer: Medicare Other

## 2016-11-18 DIAGNOSIS — J309 Allergic rhinitis, unspecified: Secondary | ICD-10-CM

## 2016-11-25 ENCOUNTER — Ambulatory Visit (INDEPENDENT_AMBULATORY_CARE_PROVIDER_SITE_OTHER): Payer: Medicare Other | Admitting: *Deleted

## 2016-11-25 DIAGNOSIS — J309 Allergic rhinitis, unspecified: Secondary | ICD-10-CM | POA: Diagnosis not present

## 2016-12-01 ENCOUNTER — Ambulatory Visit (INDEPENDENT_AMBULATORY_CARE_PROVIDER_SITE_OTHER): Payer: Medicare Other | Admitting: *Deleted

## 2016-12-01 DIAGNOSIS — J309 Allergic rhinitis, unspecified: Secondary | ICD-10-CM

## 2016-12-07 ENCOUNTER — Ambulatory Visit (INDEPENDENT_AMBULATORY_CARE_PROVIDER_SITE_OTHER): Payer: Medicare Other | Admitting: *Deleted

## 2016-12-07 DIAGNOSIS — J309 Allergic rhinitis, unspecified: Secondary | ICD-10-CM | POA: Diagnosis not present

## 2016-12-21 ENCOUNTER — Ambulatory Visit (INDEPENDENT_AMBULATORY_CARE_PROVIDER_SITE_OTHER): Payer: Medicare Other | Admitting: *Deleted

## 2016-12-21 DIAGNOSIS — J309 Allergic rhinitis, unspecified: Secondary | ICD-10-CM | POA: Diagnosis not present

## 2017-01-06 ENCOUNTER — Ambulatory Visit (INDEPENDENT_AMBULATORY_CARE_PROVIDER_SITE_OTHER): Payer: Medicare Other | Admitting: *Deleted

## 2017-01-06 DIAGNOSIS — R351 Nocturia: Secondary | ICD-10-CM | POA: Diagnosis not present

## 2017-01-06 DIAGNOSIS — Z01419 Encounter for gynecological examination (general) (routine) without abnormal findings: Secondary | ICD-10-CM | POA: Diagnosis not present

## 2017-01-06 DIAGNOSIS — Z6835 Body mass index (BMI) 35.0-35.9, adult: Secondary | ICD-10-CM | POA: Diagnosis not present

## 2017-01-06 DIAGNOSIS — J309 Allergic rhinitis, unspecified: Secondary | ICD-10-CM

## 2017-01-06 DIAGNOSIS — Z124 Encounter for screening for malignant neoplasm of cervix: Secondary | ICD-10-CM | POA: Diagnosis not present

## 2017-01-06 DIAGNOSIS — Z780 Asymptomatic menopausal state: Secondary | ICD-10-CM | POA: Diagnosis not present

## 2017-01-06 DIAGNOSIS — Z1231 Encounter for screening mammogram for malignant neoplasm of breast: Secondary | ICD-10-CM | POA: Diagnosis not present

## 2017-01-13 DIAGNOSIS — M1712 Unilateral primary osteoarthritis, left knee: Secondary | ICD-10-CM | POA: Diagnosis not present

## 2017-01-13 DIAGNOSIS — M1711 Unilateral primary osteoarthritis, right knee: Secondary | ICD-10-CM | POA: Diagnosis not present

## 2017-02-01 ENCOUNTER — Ambulatory Visit (INDEPENDENT_AMBULATORY_CARE_PROVIDER_SITE_OTHER): Payer: Medicare Other | Admitting: *Deleted

## 2017-02-01 DIAGNOSIS — J309 Allergic rhinitis, unspecified: Secondary | ICD-10-CM | POA: Diagnosis not present

## 2017-02-02 DIAGNOSIS — L820 Inflamed seborrheic keratosis: Secondary | ICD-10-CM | POA: Diagnosis not present

## 2017-02-02 DIAGNOSIS — L570 Actinic keratosis: Secondary | ICD-10-CM | POA: Diagnosis not present

## 2017-02-02 DIAGNOSIS — D485 Neoplasm of uncertain behavior of skin: Secondary | ICD-10-CM | POA: Diagnosis not present

## 2017-02-02 DIAGNOSIS — L821 Other seborrheic keratosis: Secondary | ICD-10-CM | POA: Diagnosis not present

## 2017-02-08 ENCOUNTER — Ambulatory Visit (INDEPENDENT_AMBULATORY_CARE_PROVIDER_SITE_OTHER): Payer: Medicare Other | Admitting: *Deleted

## 2017-02-08 DIAGNOSIS — J309 Allergic rhinitis, unspecified: Secondary | ICD-10-CM | POA: Diagnosis not present

## 2017-02-08 DIAGNOSIS — M170 Bilateral primary osteoarthritis of knee: Secondary | ICD-10-CM | POA: Diagnosis not present

## 2017-02-10 DIAGNOSIS — M170 Bilateral primary osteoarthritis of knee: Secondary | ICD-10-CM | POA: Diagnosis not present

## 2017-02-10 DIAGNOSIS — Z1211 Encounter for screening for malignant neoplasm of colon: Secondary | ICD-10-CM | POA: Diagnosis not present

## 2017-02-10 DIAGNOSIS — R1011 Right upper quadrant pain: Secondary | ICD-10-CM | POA: Diagnosis not present

## 2017-02-17 ENCOUNTER — Ambulatory Visit (INDEPENDENT_AMBULATORY_CARE_PROVIDER_SITE_OTHER): Payer: Medicare Other

## 2017-02-17 DIAGNOSIS — M170 Bilateral primary osteoarthritis of knee: Secondary | ICD-10-CM | POA: Diagnosis not present

## 2017-02-17 DIAGNOSIS — J309 Allergic rhinitis, unspecified: Secondary | ICD-10-CM | POA: Diagnosis not present

## 2017-03-01 ENCOUNTER — Ambulatory Visit (INDEPENDENT_AMBULATORY_CARE_PROVIDER_SITE_OTHER): Payer: Medicare Other | Admitting: *Deleted

## 2017-03-01 DIAGNOSIS — J309 Allergic rhinitis, unspecified: Secondary | ICD-10-CM | POA: Diagnosis not present

## 2017-03-01 DIAGNOSIS — M170 Bilateral primary osteoarthritis of knee: Secondary | ICD-10-CM | POA: Diagnosis not present

## 2017-03-01 DIAGNOSIS — Z1212 Encounter for screening for malignant neoplasm of rectum: Secondary | ICD-10-CM | POA: Diagnosis not present

## 2017-03-01 DIAGNOSIS — Z1211 Encounter for screening for malignant neoplasm of colon: Secondary | ICD-10-CM | POA: Diagnosis not present

## 2017-03-03 DIAGNOSIS — M1711 Unilateral primary osteoarthritis, right knee: Secondary | ICD-10-CM | POA: Diagnosis not present

## 2017-03-03 DIAGNOSIS — M1712 Unilateral primary osteoarthritis, left knee: Secondary | ICD-10-CM | POA: Diagnosis not present

## 2017-03-07 ENCOUNTER — Other Ambulatory Visit: Payer: Self-pay

## 2017-03-07 MED ORDER — MONTELUKAST SODIUM 10 MG PO TABS
10.0000 mg | ORAL_TABLET | Freq: Every day | ORAL | 0 refills | Status: DC
Start: 2017-03-07 — End: 2019-08-14

## 2017-03-07 NOTE — Telephone Encounter (Signed)
Received a fax from [facility] in regards to a refill for montelukast. I gave 1 refill with no extra refills. Patient was last seen 08/31/16 and was asked to follow up in 3 weeks. Patient canceled on 11/02/16. Patient needs office visit for further refills.

## 2017-03-10 ENCOUNTER — Ambulatory Visit (HOSPITAL_COMMUNITY)
Admission: RE | Admit: 2017-03-10 | Discharge: 2017-03-10 | Disposition: A | Discharge status: Home / Self Care | Payer: Medicare Other | Source: Ambulatory Visit | Attending: Urology | Admitting: Urology

## 2017-03-10 ENCOUNTER — Ambulatory Visit (INDEPENDENT_AMBULATORY_CARE_PROVIDER_SITE_OTHER): Payer: Medicare Other | Admitting: *Deleted

## 2017-03-10 ENCOUNTER — Other Ambulatory Visit: Payer: Self-pay | Admitting: Urology

## 2017-03-10 DIAGNOSIS — C642 Malignant neoplasm of left kidney, except renal pelvis: Secondary | ICD-10-CM | POA: Insufficient documentation

## 2017-03-10 DIAGNOSIS — J309 Allergic rhinitis, unspecified: Secondary | ICD-10-CM | POA: Diagnosis not present

## 2017-03-10 DIAGNOSIS — C649 Malignant neoplasm of unspecified kidney, except renal pelvis: Secondary | ICD-10-CM | POA: Diagnosis not present

## 2017-03-18 DIAGNOSIS — C679 Malignant neoplasm of bladder, unspecified: Secondary | ICD-10-CM | POA: Diagnosis not present

## 2017-03-18 DIAGNOSIS — C652 Malignant neoplasm of left renal pelvis: Secondary | ICD-10-CM | POA: Diagnosis not present

## 2017-03-24 ENCOUNTER — Ambulatory Visit (INDEPENDENT_AMBULATORY_CARE_PROVIDER_SITE_OTHER): Payer: Medicare Other | Admitting: *Deleted

## 2017-03-24 DIAGNOSIS — Z905 Acquired absence of kidney: Secondary | ICD-10-CM | POA: Diagnosis not present

## 2017-03-24 DIAGNOSIS — J309 Allergic rhinitis, unspecified: Secondary | ICD-10-CM

## 2017-03-24 DIAGNOSIS — R310 Gross hematuria: Secondary | ICD-10-CM | POA: Diagnosis not present

## 2017-03-24 DIAGNOSIS — C652 Malignant neoplasm of left renal pelvis: Secondary | ICD-10-CM | POA: Diagnosis not present

## 2017-03-30 DIAGNOSIS — Z1211 Encounter for screening for malignant neoplasm of colon: Secondary | ICD-10-CM | POA: Diagnosis not present

## 2017-03-30 DIAGNOSIS — K5730 Diverticulosis of large intestine without perforation or abscess without bleeding: Secondary | ICD-10-CM | POA: Diagnosis not present

## 2017-04-05 ENCOUNTER — Ambulatory Visit (INDEPENDENT_AMBULATORY_CARE_PROVIDER_SITE_OTHER): Payer: Medicare Other | Admitting: *Deleted

## 2017-04-05 DIAGNOSIS — J309 Allergic rhinitis, unspecified: Secondary | ICD-10-CM

## 2017-04-25 ENCOUNTER — Ambulatory Visit (INDEPENDENT_AMBULATORY_CARE_PROVIDER_SITE_OTHER): Payer: Medicare Other

## 2017-04-25 DIAGNOSIS — J309 Allergic rhinitis, unspecified: Secondary | ICD-10-CM | POA: Diagnosis not present

## 2017-05-10 ENCOUNTER — Ambulatory Visit (INDEPENDENT_AMBULATORY_CARE_PROVIDER_SITE_OTHER): Payer: Medicare Other | Admitting: *Deleted

## 2017-05-10 DIAGNOSIS — J309 Allergic rhinitis, unspecified: Secondary | ICD-10-CM | POA: Diagnosis not present

## 2017-05-30 ENCOUNTER — Other Ambulatory Visit: Payer: Self-pay | Admitting: Allergy and Immunology

## 2017-05-31 ENCOUNTER — Ambulatory Visit (INDEPENDENT_AMBULATORY_CARE_PROVIDER_SITE_OTHER): Payer: Medicare Other | Admitting: *Deleted

## 2017-05-31 DIAGNOSIS — J309 Allergic rhinitis, unspecified: Secondary | ICD-10-CM | POA: Diagnosis not present

## 2017-06-07 DIAGNOSIS — H9201 Otalgia, right ear: Secondary | ICD-10-CM | POA: Diagnosis not present

## 2017-06-07 DIAGNOSIS — Z6834 Body mass index (BMI) 34.0-34.9, adult: Secondary | ICD-10-CM | POA: Diagnosis not present

## 2017-06-07 DIAGNOSIS — R7301 Impaired fasting glucose: Secondary | ICD-10-CM | POA: Diagnosis not present

## 2017-06-07 DIAGNOSIS — I10 Essential (primary) hypertension: Secondary | ICD-10-CM | POA: Diagnosis not present

## 2017-06-09 ENCOUNTER — Ambulatory Visit (INDEPENDENT_AMBULATORY_CARE_PROVIDER_SITE_OTHER): Payer: Medicare Other | Admitting: *Deleted

## 2017-06-09 DIAGNOSIS — J309 Allergic rhinitis, unspecified: Secondary | ICD-10-CM

## 2017-06-16 DIAGNOSIS — H9192 Unspecified hearing loss, left ear: Secondary | ICD-10-CM | POA: Diagnosis not present

## 2017-06-16 DIAGNOSIS — M26629 Arthralgia of temporomandibular joint, unspecified side: Secondary | ICD-10-CM | POA: Diagnosis not present

## 2017-06-16 DIAGNOSIS — K219 Gastro-esophageal reflux disease without esophagitis: Secondary | ICD-10-CM | POA: Diagnosis not present

## 2017-06-16 DIAGNOSIS — H903 Sensorineural hearing loss, bilateral: Secondary | ICD-10-CM | POA: Diagnosis not present

## 2017-06-16 DIAGNOSIS — Z86018 Personal history of other benign neoplasm: Secondary | ICD-10-CM | POA: Diagnosis not present

## 2017-06-21 ENCOUNTER — Ambulatory Visit (INDEPENDENT_AMBULATORY_CARE_PROVIDER_SITE_OTHER): Payer: Medicare Other

## 2017-06-21 DIAGNOSIS — J309 Allergic rhinitis, unspecified: Secondary | ICD-10-CM | POA: Diagnosis not present

## 2017-06-28 ENCOUNTER — Ambulatory Visit (INDEPENDENT_AMBULATORY_CARE_PROVIDER_SITE_OTHER): Payer: Medicare Other | Admitting: *Deleted

## 2017-06-28 DIAGNOSIS — J309 Allergic rhinitis, unspecified: Secondary | ICD-10-CM | POA: Diagnosis not present

## 2017-07-05 ENCOUNTER — Ambulatory Visit (INDEPENDENT_AMBULATORY_CARE_PROVIDER_SITE_OTHER): Payer: Medicare Other | Admitting: Allergy and Immunology

## 2017-07-05 VITALS — BP 124/78 | HR 72 | Resp 20

## 2017-07-05 DIAGNOSIS — H6981 Other specified disorders of Eustachian tube, right ear: Secondary | ICD-10-CM

## 2017-07-05 DIAGNOSIS — J4520 Mild intermittent asthma, uncomplicated: Secondary | ICD-10-CM

## 2017-07-05 DIAGNOSIS — J3089 Other allergic rhinitis: Secondary | ICD-10-CM

## 2017-07-05 MED ORDER — METHYLPREDNISOLONE ACETATE 80 MG/ML IJ SUSP
80.0000 mg | Freq: Once | INTRAMUSCULAR | Status: AC
  Administered 2017-07-05: 80 mg via INTRAMUSCULAR

## 2017-07-05 NOTE — Patient Instructions (Addendum)
  1. Treat inflammation:    A. Depo-Medrol 80 IM delivered in clinic  B. Start OTC Rhinocort one spray each nostril one time per day  2. If needed:   A. OTC antihistamine one time per day  B. Xopenex HFA 2 puffs every 4-6 hours  C. ipratropium 0.06% 2 sprays each nostril every 6 hours if needed to dry nose  3. Continnue immunotherapy and Epi-Pen / Auvi-Q 0.3  4. Further treatment?  5. Return to clinic in 1 year or earlier if problem  6. Obtain flu vaccine this fall

## 2017-07-05 NOTE — Progress Notes (Signed)
Follow-up Note  Referring Provider: Marton Redwood, MD Primary Provider: Marton Redwood, MD Date of Office Visit: 07/05/2017  Subjective:   Emily Maxwell (DOB: 05-18-1941) is a 76 y.o. female who returns to the [facility] on 07/05/2017 in re-evaluation of the following:  HPI: Emily Maxwell presents to this clinic in reevaluation of her mild intermittent asthma and allergic rhinitis treated with immunotherapy and history of LPR. Her last visit to this clinic was November 2017.  She has really done well with her atopic respiratory disease and has not required an antibiotic or systemic steroid to treat this condition since I have last seen her in this clinic. Her immunotherapy is going quite well currently administered at every 2 weeks. She has not had an adverse effects secondary to the administration of this therapy. She rarely uses a short acting bronchodilator and can exercise without any difficulty. She has not had any issues with reflux.  However, at the tail end of August while visiting in [place] she started to develop an earache on the right side and was diagnosed with otitis media and given Omnicef. She never really resolve the cracking and popping and fullness in that ear which appears to clear transiently whenever she inflates her ears with positive pressure. She did visit with her ENT doctor, Dr. Constance Holster, recently who recommended that she stop her cefdinir because she did not have otitis media but she continued a full course of therapy. She does not have any vertigo and she does not have any tinnitus. She has not been having any fever. She has not really been having any significant associated systemic or constitutional symptoms.  Allergies as of 07/05/2017      Reactions   Avelox [moxifloxacin Hcl In Nacl] Other (See Comments)   Unable to describe reaction   Epinephrine Other (See Comments)   Makes heart rate increase and cold sweats    Tramadol Other (See Comments)   Unable  to describe reaction   Penicillins Rash   Childhood rxn from weekly PCN shots w/ asthma Tx Has patient had a PCN reaction causing immediate rash, facial/tongue/throat swelling, SOB or lightheadedness with hypotension: unknown Has patient had a PCN reaction causing severe rash involving mucus membranes or skin necrosis: unknown Has patient had a PCN reaction that required hospitalization: no   Has patient had a PCN reaction occurring within the last 10 years: no If all of the above answers are "NO", then may proceed with Cephalosporin   Sulfa Antibiotics Rash   Tolerates non-antibiotic sulfa drugs      Medication List      acetaminophen 325 MG tablet Commonly known as:  TYLENOL Take 650 mg by mouth every 6 (six) hours as needed for mild pain or headache.   BIOTIN PO Take by mouth.   CALCIUM PO Take by mouth.   docusate sodium 100 MG capsule Commonly known as:  COLACE Take 2 capsules (200 mg total) by mouth 2 (two) times daily.   doxycycline 100 MG capsule Commonly known as:  VIBRAMYCIN   fexofenadine 180 MG tablet Commonly known as:  ALLEGRA Take 180 mg by mouth daily as needed for allergies. As needed   FISH OIL PO Take by mouth.   Fluticasone Furoate 200 MCG/ACT Aepb Commonly known as:  ARNUITY ELLIPTA Inhale 1 Dose into the lungs daily.   GRAPESEED EXTRACT PO Take by mouth daily.   guaiFENesin 600 MG 12 hr tablet Commonly known as:  MUCINEX Take 1,200 mg by  mouth 2 (two) times daily as needed for cough or to loosen phlegm.   irbesartan 150 MG tablet Commonly known as:  AVAPRO Take 150 mg by mouth daily.   levalbuterol 45 MCG/ACT inhaler Commonly known as:  XOPENEX HFA Inhale 1-2 puffs into the lungs every 4 (four) hours as needed for wheezing.   levothyroxine 100 MCG tablet Commonly known as:  SYNTHROID, LEVOTHROID Take 100 mcg by mouth daily.   loratadine 10 MG tablet Commonly known as:  CLARITIN Take 10 mg by mouth daily.   montelukast 10 MG  tablet Commonly known as:  SINGULAIR Take 1 tablet (10 mg total) by mouth at bedtime. As needed   PRESCRIPTION MEDICATION Allergy shots 2 x per week either or Tuesday, Wednesday, or Thursday   TENUATE PO Take by mouth daily.   valsartan 80 MG tablet Commonly known as:  DIOVAN Take 80 mg by mouth daily. Patient takes in the am   VITAMIN C PO Take by mouth.   VITAMIN D PO Take by mouth.   VITAMIN E PO Take by mouth.   ZETIA 10 MG tablet Generic drug:  ezetimibe Take 10 mg by mouth daily.       Past Medical History:  Diagnosis Date  . Aneurysm, ascending aorta ([place]) 2013   48mm  . Anxiety    hx of  anxiety , had stress test several years ago per patient that was normal   . Arthritis    thumbs , oa  . Ascending aorta dilation ([place]) 08/07/2012  . Asthma   . Asthma   . Cancer [facility])    cancer left kidney  . Cancer of kidney [facility])    Had right kidney removed  . Complication of anesthesia    pulse rate slow in past, did ok with recent anesthesia  . Deaf    left ear  . Dysrhythmia    occasional arrythmia, no meds taken for  . Hematuria    no blood seen recently  . Hematuria   . Hyperlipidemia   . Hyperplastic colon polyp   . Hypertension   . Hypothyroidism   . IFG (impaired fasting glucose)   . Nodule of left lung    stable, surveillance  Dr Brigitte Pulse  . Numbness    left thigh, when walking or standing for long periods of time  . Osteopenia   . RLS (restless legs syndrome)   . Rosacea   . Simple endometrial hyperplasia years ago  . Thoracic aortic aneurysm ([place])    stable per last chest ct 07-11-14 [facility] on chart-   . Vasovagal reaction    hx of     Past Surgical History:  Procedure Laterality Date  . acoustic neuroma S/P resection Lt ear deafness 1990     tinnitus  from left ear  . BTL    . bumonectomy Bilateral    x 2  . CATARACT EXTRACTION Bilateral 2004  . CRANIECTOMY FOR EXCISION OF ACOUSTIC NEUROMA Left 1990   Deaf on Left side  .  CYSTOSCOPY WITH RETROGRADE PYELOGRAM, URETEROSCOPY AND STENT PLACEMENT Bilateral 08/07/2015   Procedure: CYSTOSCOPY WITH BILATERAL RETROGRADE PYELOGRAM, LEFT URETEROSCOPY WITH BIOPSY AND  LEFT STENT PLACEMENT;  Surgeon: Alexis Frock, MD;  Location: [facility];  Service: Urology;  Laterality: Bilateral;  . EYE SURGERY Bilateral 2013   eyelid drooping fixed  . EYE SURGERY Bilateral    yag procedure after cataracts  . left hand middle finger surgery   2015    cadaver  bone placed   . left shoulder surgery 2005    . Lt knee surgery 2008 Left 2008   tears twice one surgery  . lt wrist surgery 2005    . mortone neuroma  2010   left foot 3rd and 4th toe  . ROBOT ASSITED LAPAROSCOPIC NEPHROURETERECTOMY Left 09/19/2015   Procedure: ROBOT ASSITED LAPAROSCOPIC NEPHROURETERECTOMY WITH DIAPHRAMATIC HERNIA REPAIR;  Surgeon: Alexis Frock, MD;  Location: [facility];  Service: Urology;  Laterality: Left;  . SP CHOLECYSTOMY    . THYROIDECTOMY  11/2009   S/P Adenoma follicular vs hyperplastic  . TONSILLECTOMY  age 28  . TUBAL LIGATION  1976  . WRIST SURGERY Left 2007   wrist cyst removed    Review of systems negative except as noted in HPI / PMHx or noted below:  Review of Systems  Constitutional: Negative.   HENT: Negative.   Eyes: Negative.   Respiratory: Negative.   Cardiovascular: Negative.   Gastrointestinal: Negative.   Genitourinary: Negative.   Musculoskeletal: Negative.   Skin: Negative.   Neurological: Negative.   Endo/Heme/Allergies: Negative.   Psychiatric/Behavioral: Negative.      Objective:   Vitals:   07/06/17 0913  BP: 124/78  Pulse: 72  Resp: 20          Physical Exam  Constitutional: She is well-developed, well-nourished, and in no distress.  HENT:  Head: Normocephalic.  Right Ear: External ear and ear canal normal. Tympanic membrane mobility is abnormal (Lack of pneumatic movement).  Left Ear: Tympanic membrane, external ear and ear canal normal.  Nose: Nose  normal. No mucosal edema or rhinorrhea.  Mouth/Throat: Uvula is midline, oropharynx is clear and moist and mucous membranes are normal. No oropharyngeal exudate.  Eyes: Conjunctivae are normal.  Neck: Trachea normal. No tracheal tenderness present. No tracheal deviation present. No thyromegaly present.  Cardiovascular: Normal rate, regular rhythm, S1 normal, S2 normal and normal heart sounds.   No murmur heard. Pulmonary/Chest: Breath sounds normal. No stridor. No respiratory distress. She has no wheezes. She has no rales.  Musculoskeletal: She exhibits no edema.  Lymphadenopathy:       Head (right side): No tonsillar adenopathy present.       Head (left side): No tonsillar adenopathy present.    She has no cervical adenopathy.  Neurological: She is alert. Gait normal.  Skin: No rash noted. She is not diaphoretic. No erythema. Nails show no clubbing.  Psychiatric: Mood and affect normal.    Diagnostics:    Spirometry was performed and demonstrated an FEV1 of 2.02 at 100 % of predicted.  The patient had an Asthma Control Test with the following results: ACT Total Score: 24.    Assessment and Plan:   1. ETD (Eustachian tube dysfunction), right   2. Other allergic rhinitis   3. Asthma, mild intermittent, well-controlled     1. Treat inflammation:    A. Depo-Medrol 80 IM delivered in clinic  B. Start OTC Rhinocort one spray each nostril one time per day  2. If needed:   A. OTC antihistamine one time per day  B. Xopenex HFA 2 puffs every 4-6 hours  C. ipratropium 0.06% 2 sprays each nostril every 6 hours if needed to dry nose  3. Continnue immunotherapy and Epi-Pen / Auvi-Q 0.3  4. Further treatment?  5. Return to clinic in 1 year or earlier if problem  6. Obtain flu vaccine this fall  I will assume that Emily Maxwell has a form of ETD affecting her right  ear and treat her with anti-inflammatory agents as noted above and make a decision concerning further evaluation and  treatment based upon her response to treatment. This will be her first steroid in almost one year. She will keep in contact with me noting her response and if she does well I will see her back in this clinic in 1 year or earlier if there is a problem.  Allena Katz, MD Allergy / Immunology [place]

## 2017-07-06 MED ORDER — LEVALBUTEROL TARTRATE 45 MCG/ACT IN AERO: 1.0000 | INHALATION_SPRAY | RESPIRATORY_TRACT | 0 refills | Status: DC | PRN

## 2017-07-06 MED ORDER — IPRATROPIUM BROMIDE 0.06 % NA SOLN: 2.0000 | Freq: Four times a day (QID) | NASAL | 5 refills | Status: DC

## 2017-07-08 ENCOUNTER — Encounter: Payer: Self-pay | Admitting: Allergy and Immunology

## 2017-07-12 DIAGNOSIS — Z23 Encounter for immunization: Secondary | ICD-10-CM | POA: Diagnosis not present

## 2017-07-19 ENCOUNTER — Encounter: Payer: Self-pay | Admitting: *Deleted

## 2017-07-19 ENCOUNTER — Ambulatory Visit (INDEPENDENT_AMBULATORY_CARE_PROVIDER_SITE_OTHER): Payer: Medicare Other | Admitting: *Deleted

## 2017-07-19 DIAGNOSIS — J309 Allergic rhinitis, unspecified: Secondary | ICD-10-CM | POA: Diagnosis not present

## 2017-07-21 DIAGNOSIS — M9904 Segmental and somatic dysfunction of sacral region: Secondary | ICD-10-CM | POA: Diagnosis not present

## 2017-07-21 DIAGNOSIS — M9903 Segmental and somatic dysfunction of lumbar region: Secondary | ICD-10-CM | POA: Diagnosis not present

## 2017-07-21 DIAGNOSIS — M5117 Intervertebral disc disorders with radiculopathy, lumbosacral region: Secondary | ICD-10-CM | POA: Diagnosis not present

## 2017-07-21 DIAGNOSIS — M25552 Pain in left hip: Secondary | ICD-10-CM | POA: Diagnosis not present

## 2017-07-21 DIAGNOSIS — M9905 Segmental and somatic dysfunction of pelvic region: Secondary | ICD-10-CM | POA: Diagnosis not present

## 2017-07-25 DIAGNOSIS — M25552 Pain in left hip: Secondary | ICD-10-CM | POA: Diagnosis not present

## 2017-07-25 DIAGNOSIS — M9905 Segmental and somatic dysfunction of pelvic region: Secondary | ICD-10-CM | POA: Diagnosis not present

## 2017-07-25 DIAGNOSIS — M9903 Segmental and somatic dysfunction of lumbar region: Secondary | ICD-10-CM | POA: Diagnosis not present

## 2017-07-25 DIAGNOSIS — M9904 Segmental and somatic dysfunction of sacral region: Secondary | ICD-10-CM | POA: Diagnosis not present

## 2017-07-25 DIAGNOSIS — M5117 Intervertebral disc disorders with radiculopathy, lumbosacral region: Secondary | ICD-10-CM | POA: Diagnosis not present

## 2017-07-26 ENCOUNTER — Ambulatory Visit (INDEPENDENT_AMBULATORY_CARE_PROVIDER_SITE_OTHER): Payer: Medicare Other

## 2017-07-26 DIAGNOSIS — J309 Allergic rhinitis, unspecified: Secondary | ICD-10-CM

## 2017-08-04 ENCOUNTER — Encounter: Payer: Self-pay | Admitting: *Deleted

## 2017-08-04 DIAGNOSIS — J3089 Other allergic rhinitis: Secondary | ICD-10-CM | POA: Diagnosis not present

## 2017-08-04 NOTE — Progress Notes (Signed)
Maintenance vials made

## 2017-08-05 DIAGNOSIS — J3089 Other allergic rhinitis: Secondary | ICD-10-CM | POA: Diagnosis not present

## 2017-08-08 DIAGNOSIS — E7849 Other hyperlipidemia: Secondary | ICD-10-CM | POA: Diagnosis not present

## 2017-08-08 DIAGNOSIS — R7301 Impaired fasting glucose: Secondary | ICD-10-CM | POA: Diagnosis not present

## 2017-08-08 DIAGNOSIS — M859 Disorder of bone density and structure, unspecified: Secondary | ICD-10-CM | POA: Diagnosis not present

## 2017-08-08 DIAGNOSIS — Z0000 Encounter for general adult medical examination without abnormal findings: Secondary | ICD-10-CM | POA: Diagnosis not present

## 2017-08-08 DIAGNOSIS — I10 Essential (primary) hypertension: Secondary | ICD-10-CM | POA: Diagnosis not present

## 2017-08-08 DIAGNOSIS — E890 Postprocedural hypothyroidism: Secondary | ICD-10-CM | POA: Diagnosis not present

## 2017-08-08 DIAGNOSIS — R82998 Other abnormal findings in urine: Secondary | ICD-10-CM | POA: Diagnosis not present

## 2017-08-10 DIAGNOSIS — L820 Inflamed seborrheic keratosis: Secondary | ICD-10-CM | POA: Diagnosis not present

## 2017-08-10 DIAGNOSIS — L570 Actinic keratosis: Secondary | ICD-10-CM | POA: Diagnosis not present

## 2017-08-12 DIAGNOSIS — E7849 Other hyperlipidemia: Secondary | ICD-10-CM | POA: Diagnosis not present

## 2017-08-12 DIAGNOSIS — R7301 Impaired fasting glucose: Secondary | ICD-10-CM | POA: Diagnosis not present

## 2017-08-12 DIAGNOSIS — M859 Disorder of bone density and structure, unspecified: Secondary | ICD-10-CM | POA: Diagnosis not present

## 2017-08-12 DIAGNOSIS — Z0000 Encounter for general adult medical examination without abnormal findings: Secondary | ICD-10-CM | POA: Diagnosis not present

## 2017-08-12 DIAGNOSIS — Z1389 Encounter for screening for other disorder: Secondary | ICD-10-CM | POA: Diagnosis not present

## 2017-08-12 DIAGNOSIS — J45909 Unspecified asthma, uncomplicated: Secondary | ICD-10-CM | POA: Diagnosis not present

## 2017-08-12 DIAGNOSIS — E890 Postprocedural hypothyroidism: Secondary | ICD-10-CM | POA: Diagnosis not present

## 2017-08-12 DIAGNOSIS — E669 Obesity, unspecified: Secondary | ICD-10-CM | POA: Diagnosis not present

## 2017-08-12 DIAGNOSIS — I10 Essential (primary) hypertension: Secondary | ICD-10-CM | POA: Diagnosis not present

## 2017-08-12 DIAGNOSIS — Z85528 Personal history of other malignant neoplasm of kidney: Secondary | ICD-10-CM | POA: Diagnosis not present

## 2017-08-12 DIAGNOSIS — N183 Chronic kidney disease, stage 3 (moderate): Secondary | ICD-10-CM | POA: Diagnosis not present

## 2017-08-12 DIAGNOSIS — Z6835 Body mass index (BMI) 35.0-35.9, adult: Secondary | ICD-10-CM | POA: Diagnosis not present

## 2017-08-12 DIAGNOSIS — I712 Thoracic aortic aneurysm, without rupture: Secondary | ICD-10-CM | POA: Diagnosis not present

## 2017-08-16 ENCOUNTER — Ambulatory Visit (INDEPENDENT_AMBULATORY_CARE_PROVIDER_SITE_OTHER): Payer: Medicare Other | Admitting: *Deleted

## 2017-08-16 DIAGNOSIS — J309 Allergic rhinitis, unspecified: Secondary | ICD-10-CM

## 2017-08-30 DIAGNOSIS — B354 Tinea corporis: Secondary | ICD-10-CM | POA: Diagnosis not present

## 2017-09-01 ENCOUNTER — Ambulatory Visit (INDEPENDENT_AMBULATORY_CARE_PROVIDER_SITE_OTHER): Payer: Medicare Other | Admitting: *Deleted

## 2017-09-01 DIAGNOSIS — J309 Allergic rhinitis, unspecified: Secondary | ICD-10-CM | POA: Diagnosis not present

## 2017-09-12 ENCOUNTER — Ambulatory Visit (HOSPITAL_COMMUNITY)
Admission: RE | Admit: 2017-09-12 | Discharge: 2017-09-12 | Disposition: A | Discharge status: Home / Self Care | Payer: Medicare Other | Source: Ambulatory Visit | Attending: Urology | Admitting: Urology

## 2017-09-12 ENCOUNTER — Other Ambulatory Visit: Payer: Self-pay | Admitting: Urology

## 2017-09-12 DIAGNOSIS — R05 Cough: Secondary | ICD-10-CM | POA: Diagnosis not present

## 2017-09-12 DIAGNOSIS — C642 Malignant neoplasm of left kidney, except renal pelvis: Secondary | ICD-10-CM | POA: Diagnosis not present

## 2017-09-12 DIAGNOSIS — C652 Malignant neoplasm of left renal pelvis: Secondary | ICD-10-CM | POA: Diagnosis not present

## 2017-09-13 ENCOUNTER — Ambulatory Visit (INDEPENDENT_AMBULATORY_CARE_PROVIDER_SITE_OTHER): Payer: Medicare Other | Admitting: *Deleted

## 2017-09-13 DIAGNOSIS — J309 Allergic rhinitis, unspecified: Secondary | ICD-10-CM | POA: Diagnosis not present

## 2017-09-15 DIAGNOSIS — C652 Malignant neoplasm of left renal pelvis: Secondary | ICD-10-CM | POA: Diagnosis not present

## 2017-09-20 ENCOUNTER — Ambulatory Visit (INDEPENDENT_AMBULATORY_CARE_PROVIDER_SITE_OTHER): Payer: Medicare Other | Admitting: *Deleted

## 2017-09-20 DIAGNOSIS — J309 Allergic rhinitis, unspecified: Secondary | ICD-10-CM | POA: Diagnosis not present

## 2017-09-22 DIAGNOSIS — C652 Malignant neoplasm of left renal pelvis: Secondary | ICD-10-CM | POA: Diagnosis not present

## 2017-09-29 DIAGNOSIS — N3000 Acute cystitis without hematuria: Secondary | ICD-10-CM | POA: Diagnosis not present

## 2017-10-05 DIAGNOSIS — Z1212 Encounter for screening for malignant neoplasm of rectum: Secondary | ICD-10-CM | POA: Diagnosis not present

## 2017-10-06 ENCOUNTER — Ambulatory Visit (INDEPENDENT_AMBULATORY_CARE_PROVIDER_SITE_OTHER): Payer: Medicare Other | Admitting: Podiatry

## 2017-10-06 ENCOUNTER — Ambulatory Visit (INDEPENDENT_AMBULATORY_CARE_PROVIDER_SITE_OTHER): Payer: Medicare Other | Admitting: *Deleted

## 2017-10-06 DIAGNOSIS — J309 Allergic rhinitis, unspecified: Secondary | ICD-10-CM | POA: Diagnosis not present

## 2017-10-06 DIAGNOSIS — B351 Tinea unguium: Secondary | ICD-10-CM | POA: Diagnosis not present

## 2017-10-06 MED ORDER — CICLOPIROX 8 % EX SOLN: Freq: Every day | CUTANEOUS | 0 refills | Status: DC

## 2017-10-17 NOTE — Progress Notes (Signed)
  Subjective:  Patient ID: Emily Maxwell, female    DOB: 05-Jan-1941,  MRN: [id number]  76 y.o. female returns for the above complaint.  Reports nail discoloration to the left great toe  Objective:  There were no vitals filed for this visit. General AA&O x3. Normal mood and affect.  Vascular Pedal pulses palpable.  Neurologic Epicritic sensation grossly intact.  Dermatologic No open lesions. Skin normal texture and turgor. Yellow discoloration left great toe  Orthopedic: No pain to palpation either foot.     Assessment & Plan:  Patient was evaluated and treated and all questions answered.  Onychomycosis -Rx ciclopirox. -Patient educated on etiology nail fungus and difficulty in treating the infection -Follow-up PRN

## 2017-10-20 ENCOUNTER — Ambulatory Visit (INDEPENDENT_AMBULATORY_CARE_PROVIDER_SITE_OTHER): Payer: Medicare Other | Admitting: *Deleted

## 2017-10-20 DIAGNOSIS — J309 Allergic rhinitis, unspecified: Secondary | ICD-10-CM | POA: Diagnosis not present

## 2017-10-25 ENCOUNTER — Ambulatory Visit (INDEPENDENT_AMBULATORY_CARE_PROVIDER_SITE_OTHER): Payer: Medicare Other | Admitting: *Deleted

## 2017-10-25 DIAGNOSIS — J309 Allergic rhinitis, unspecified: Secondary | ICD-10-CM

## 2017-11-07 ENCOUNTER — Ambulatory Visit (INDEPENDENT_AMBULATORY_CARE_PROVIDER_SITE_OTHER): Payer: Medicare Other

## 2017-11-07 DIAGNOSIS — J309 Allergic rhinitis, unspecified: Secondary | ICD-10-CM | POA: Diagnosis not present

## 2017-11-10 DIAGNOSIS — D2271 Melanocytic nevi of right lower limb, including hip: Secondary | ICD-10-CM | POA: Diagnosis not present

## 2017-11-10 DIAGNOSIS — L718 Other rosacea: Secondary | ICD-10-CM | POA: Diagnosis not present

## 2017-11-10 DIAGNOSIS — L218 Other seborrheic dermatitis: Secondary | ICD-10-CM | POA: Diagnosis not present

## 2017-11-10 DIAGNOSIS — L821 Other seborrheic keratosis: Secondary | ICD-10-CM | POA: Diagnosis not present

## 2017-11-10 DIAGNOSIS — D225 Melanocytic nevi of trunk: Secondary | ICD-10-CM | POA: Diagnosis not present

## 2017-11-10 DIAGNOSIS — L814 Other melanin hyperpigmentation: Secondary | ICD-10-CM | POA: Diagnosis not present

## 2017-11-10 DIAGNOSIS — L304 Erythema intertrigo: Secondary | ICD-10-CM | POA: Diagnosis not present

## 2017-11-10 DIAGNOSIS — L720 Epidermal cyst: Secondary | ICD-10-CM | POA: Diagnosis not present

## 2017-11-15 ENCOUNTER — Ambulatory Visit (INDEPENDENT_AMBULATORY_CARE_PROVIDER_SITE_OTHER): Payer: Medicare Other | Admitting: *Deleted

## 2017-11-15 DIAGNOSIS — J309 Allergic rhinitis, unspecified: Secondary | ICD-10-CM

## 2017-11-22 ENCOUNTER — Ambulatory Visit (INDEPENDENT_AMBULATORY_CARE_PROVIDER_SITE_OTHER): Payer: Medicare Other | Admitting: *Deleted

## 2017-11-22 DIAGNOSIS — J309 Allergic rhinitis, unspecified: Secondary | ICD-10-CM | POA: Diagnosis not present

## 2017-12-01 ENCOUNTER — Ambulatory Visit (INDEPENDENT_AMBULATORY_CARE_PROVIDER_SITE_OTHER): Payer: Medicare Other | Admitting: *Deleted

## 2017-12-01 DIAGNOSIS — J309 Allergic rhinitis, unspecified: Secondary | ICD-10-CM

## 2017-12-07 ENCOUNTER — Encounter: Payer: Self-pay | Admitting: Podiatry

## 2017-12-07 ENCOUNTER — Ambulatory Visit (INDEPENDENT_AMBULATORY_CARE_PROVIDER_SITE_OTHER): Payer: Medicare Other | Admitting: *Deleted

## 2017-12-07 ENCOUNTER — Ambulatory Visit (INDEPENDENT_AMBULATORY_CARE_PROVIDER_SITE_OTHER): Payer: Medicare Other | Admitting: Podiatry

## 2017-12-07 DIAGNOSIS — B351 Tinea unguium: Secondary | ICD-10-CM | POA: Diagnosis not present

## 2017-12-07 DIAGNOSIS — M79609 Pain in unspecified limb: Secondary | ICD-10-CM

## 2017-12-07 DIAGNOSIS — J309 Allergic rhinitis, unspecified: Secondary | ICD-10-CM

## 2017-12-07 DIAGNOSIS — L600 Ingrowing nail: Secondary | ICD-10-CM

## 2017-12-07 NOTE — Progress Notes (Signed)
  Subjective:  Patient ID: Emily Maxwell, female    DOB: 1941-02-11,  MRN: [id number]  Chief Complaint  Patient presents with  . Nail Problem    the left big toenail is curling into the skin and i have done the medicine    77 y.o. female returns for the above complaint.  Complains of ingrown nail to the left great toe. States she is unable to undergo permanent solution at this time due to an upcoming vacation.  Objective:  There were no vitals filed for this visit. General AA&O x3. Normal mood and affect.  Vascular Pedal pulses palpable.  Neurologic Epicritic sensation grossly intact.  Dermatologic No open lesions. Skin normal texture and turgor.  Left hallux nail ingrowing medial lateral nail borders  Orthopedic: No pain to palpation either foot.   Assessment & Plan:  Patient was evaluated and treated and all questions answered.  Ingrown nail -Left hallux nail debrided in slant back fashion to both borders to alleviate painful ingrown nail -Discussed possible permanent procedure in the future when patient gets back from vacation.   Return if symptoms worsen or fail to improve.

## 2017-12-20 ENCOUNTER — Ambulatory Visit (INDEPENDENT_AMBULATORY_CARE_PROVIDER_SITE_OTHER): Payer: Medicare Other | Admitting: *Deleted

## 2017-12-20 DIAGNOSIS — J309 Allergic rhinitis, unspecified: Secondary | ICD-10-CM | POA: Diagnosis not present

## 2017-12-23 DIAGNOSIS — D1801 Hemangioma of skin and subcutaneous tissue: Secondary | ICD-10-CM | POA: Diagnosis not present

## 2017-12-23 DIAGNOSIS — L218 Other seborrheic dermatitis: Secondary | ICD-10-CM | POA: Diagnosis not present

## 2017-12-23 DIAGNOSIS — L304 Erythema intertrigo: Secondary | ICD-10-CM | POA: Diagnosis not present

## 2018-01-03 ENCOUNTER — Ambulatory Visit (INDEPENDENT_AMBULATORY_CARE_PROVIDER_SITE_OTHER): Payer: Medicare Other | Admitting: *Deleted

## 2018-01-03 DIAGNOSIS — J309 Allergic rhinitis, unspecified: Secondary | ICD-10-CM

## 2018-01-10 ENCOUNTER — Ambulatory Visit (INDEPENDENT_AMBULATORY_CARE_PROVIDER_SITE_OTHER): Payer: Medicare Other | Admitting: *Deleted

## 2018-01-10 DIAGNOSIS — J309 Allergic rhinitis, unspecified: Secondary | ICD-10-CM

## 2018-01-11 DIAGNOSIS — E890 Postprocedural hypothyroidism: Secondary | ICD-10-CM | POA: Diagnosis not present

## 2018-01-13 DIAGNOSIS — H11153 Pinguecula, bilateral: Secondary | ICD-10-CM | POA: Diagnosis not present

## 2018-01-13 DIAGNOSIS — Z961 Presence of intraocular lens: Secondary | ICD-10-CM | POA: Diagnosis not present

## 2018-01-27 ENCOUNTER — Ambulatory Visit (INDEPENDENT_AMBULATORY_CARE_PROVIDER_SITE_OTHER): Payer: Medicare Other | Admitting: Podiatry

## 2018-01-27 DIAGNOSIS — L600 Ingrowing nail: Secondary | ICD-10-CM

## 2018-01-27 NOTE — Patient Instructions (Signed)
Place 1/4 cup of epsom salts in a quart of warm tap water.  Submerge your foot or feet in the solution and soak for 20 minutes.  This soak should be done twice a day.  Next, remove your foot or feet from solution, blot dry the affected area. Apply ointment and cover if instructed by your doctor.   IF YOUR SKIN BECOMES IRRITATED WHILE USING THESE INSTRUCTIONS, IT IS OKAY TO SWITCH TO  WHITE VINEGAR AND WATER.  As another alternative soak, you may use antibacterial soap and water.  Monitor for any signs/symptoms of infection. Call the office immediately if any occur or go directly to the emergency room. Call with any questions/concerns.  Ingrown Toenail An ingrown toenail occurs when the corner or sides of your toenail grow into the surrounding skin. The big toe is most commonly affected, but it can happen to any of your toes. If your ingrown toenail is not treated, you will be at risk for infection. What are the causes? This condition may be caused by:  Wearing shoes that are too small or tight.  Injury or trauma, such as stubbing your toe or having your toe stepped on.  Improper cutting or care of your toenails.  Being born with (congenital) nail or foot abnormalities, such as having a nail that is too big for your toe.  What increases the risk? Risk factors for an ingrown toenail include:  Age. Your nails tend to thicken as you get older, so ingrown nails are more common in older people.  Diabetes.  Cutting your toenails incorrectly.  Blood circulation problems.  What are the signs or symptoms? Symptoms may include:  Pain, soreness, or tenderness.  Redness.  Swelling.  Hardening of the skin surrounding the toe.  Your ingrown toenail may be infected if there is fluid, pus, or drainage. How is this diagnosed? An ingrown toenail may be diagnosed by medical history and physical exam. If your toenail is infected, your health care provider may test a sample of the  drainage. How is this treated? Treatment depends on the severity of your ingrown toenail. Some ingrown toenails may be treated at home. More severe or infected ingrown toenails may require surgery to remove all or part of the nail. Infected ingrown toenails may also be treated with antibiotic medicines. Follow these instructions at home:  If you were prescribed an antibiotic medicine, finish all of it even if you start to feel better.  Soak your foot in warm soapy water for 20 minutes, 3 times per day or as directed by your health care provider.  Carefully lift the edge of the nail away from the sore skin by wedging a small piece of cotton under the corner of the nail. This may help with the pain. Be careful not to cause more injury to the area.  Wear shoes that fit well. If your ingrown toenail is causing you pain, try wearing sandals, if possible.  Trim your toenails regularly and carefully. Do not cut them in a curved shape. Cut your toenails straight across. This prevents injury to the skin at the corners of the toenail.  Keep your feet clean and dry.  If you are having trouble walking and are given crutches by your health care provider, use them as directed.  Do not pick at your toenail or try to remove it yourself.  Take medicines only as directed by your health care provider.  Keep all follow-up visits as directed by your health care provider. This   is important. Contact a health care provider if:  Your symptoms do not improve with treatment. Get help right away if:  You have red streaks that start at your foot and go up your leg.  You have a fever.  You have increased redness, swelling, or pain.  You have fluid, blood, or pus coming from your toenail. This information is not intended to replace advice given to you by your health care provider. Make sure you discuss any questions you have with your health care provider. Document Released: 10/01/2000 Document Revised:  03/05/2016 Document Reviewed: 08/28/2014 Elsevier Interactive Patient Education  2018 Elsevier Inc.  

## 2018-01-29 NOTE — Progress Notes (Signed)
  Subjective:  Patient ID: Emily Maxwell, female    DOB: 16-Nov-1940,  MRN: [id number]  Chief Complaint  Patient presents with  . Ingrown Toenail    Left great toe, both borders  . Callouses    trim callus, left foot   77 y.o. female returns for the above complaint.  Reports ingrown has returned.  Unable to have the permanent procedure performed last visit due to pending vacation. Wishes to proceed with procedure today.  Objective:  There were no vitals filed for this visit. General AA&O x3. Normal mood and affect.  Vascular Pedal pulses palpable.  Neurologic Epicritic sensation grossly intact.  Dermatologic No open lesions. Skin normal texture and turgor.  Left hallux nail ingrowing medial lateral nail borders  Orthopedic: No pain to palpation either foot.   Assessment & Plan:  Patient was evaluated and treated and all questions answered.  Ingrown Nail, left -Patient elects to proceed with ingrown toenail removal today -Ingrown nail excised. See procedure note. -Educated on post-procedure care including soaking. Written instructions provided. -Patient to follow up in 2 weeks for nail check.  Procedure: Excision of Ingrown Toenail Location: Left 1st toe both nail borders. Anesthesia: Lidocaine 1% plain; 1.80mL and Marcaine 0.5% plain; 1.58mL, digital block. Skin Prep: Betadine. Dressing: Silvadene; telfa; dry, sterile, compression dressing. Technique: Following skin prep, the toe was exsanguinated and a tourniquet was secured at the base of the toe. The affected nail border was freed, split with a nail splitter, and excised. Chemical matrixectomy was then performed with phenol and irrigated out with alcohol. The tourniquet was then removed and sterile dressing applied. Disposition: Patient tolerated procedure well. Patient to return in 2 weeks for follow-up.   Return in about 2 weeks (around 02/10/2018) for follow up ingrown / nail check.

## 2018-01-30 ENCOUNTER — Telehealth: Payer: Self-pay | Admitting: Podiatry

## 2018-01-30 NOTE — Telephone Encounter (Signed)
I have spoken to Patient apologized for what has happened and assured her that we would make sure the correct information would be mailed to her today. Also let patient know that a nurse would be calling to go over the patient care instructions. I will need to be report to [facility].

## 2018-01-30 NOTE — Telephone Encounter (Signed)
I informed pt, she would need to soak the foot 2 x daily for 20 minutes in 1/4 cup epsom salt and 1 qt of warm water for 2 weeks until the area got a dry hard scab. I told pt the area would be red, oozy, weepy and swollen but the symptoms would decrease the further she got from the procedure date, but if the areas symptoms regressed she should call for an appt. I told pt to shred the AVN of the other pt, if she did not feel comfortable to bring to me and I would take care of it.

## 2018-01-30 NOTE — Telephone Encounter (Signed)
Mailed AVN to pt's home address.

## 2018-01-30 NOTE — Telephone Encounter (Signed)
I was seen on Friday and I was discharged with a discharge paper. I didn't really look at the discharge paper as I was in pain. It says Emily Maxwell DOB 02.15.1962. So I have the wrong set of instructions for discharge instructions. I'm a tad bit upset about it because I don't quite know what I'm supposed to do today. I've soaked my toe for two days. Do I continue soaking it or I don't know what to do. I'm a little upset that I was given what appears to be someone else's discharge papers which means that they have my information. I need a call back at [phone].

## 2018-02-06 ENCOUNTER — Encounter: Payer: Self-pay | Admitting: Podiatry

## 2018-02-06 ENCOUNTER — Ambulatory Visit (INDEPENDENT_AMBULATORY_CARE_PROVIDER_SITE_OTHER): Payer: Medicare Other | Admitting: Podiatry

## 2018-02-06 DIAGNOSIS — L600 Ingrowing nail: Secondary | ICD-10-CM

## 2018-02-06 NOTE — Patient Instructions (Addendum)
Soak Instructions    THE DAY AFTER THE PROCEDURE  Place 1/4 cup of epsom salts in a quart of warm tap water.  Submerge your foot or feet with outer bandage intact for the initial soak; this will allow the bandage to become moist and wet for easy lift off.  Once you remove your bandage, continue to soak in the solution for 20 minutes.  This soak should be done twice a day.  Next, remove your foot or feet from solution, blot dry the affected area and cover.  You may use a band aid large enough to cover the area or use gauze and tape.  Apply other medications to the area as directed by the doctor such as polysporin neosporin.  IF YOUR SKIN BECOMES IRRITATED WHILE USING THESE INSTRUCTIONS, IT IS OKAY TO SWITCH TO  WHITE VINEGAR AND WATER. Or you may use antibacterial soap and water to keep the toe clean  Monitor for any signs/symptoms of infection. Call the office immediately if any occur or go directly to the emergency room. Call with any questions/concerns.    [place] Instructions-Post Nail Surgery  You have had your ingrown toenail and root treated with a chemical.  This chemical causes a burn that will drain and ooze like a blister.  This can drain for 6-8 weeks or longer.  It is important to keep this area clean, covered, and follow the soaking instructions dispensed at the time of your surgery.  This area will eventually dry and form a scab.  Once the scab forms you no longer need to soak or apply a dressing.  If at any time you experience an increase in pain, redness, swelling, or drainage, you should contact the office as soon as possible.   **Soak toe twice a day for 20 minutes at a time, then rinse off with water; soak for 2 weeks

## 2018-02-07 NOTE — Progress Notes (Signed)
Subjective:   Patient ID: Emily Maxwell, female   DOB: 77 y.o.   MRN: [id number]   HPI Patient was concerned because her toe still felt tender on her big toe left and she wanted it checked   ROS      Objective:  Physical Exam  Patient had a ingrown toenail procedure done by Dr. March Rummage last week with slight redness in the corners but no drainage no proximal edema erythema noted     Assessment:  Localized paronychia infection that is low-grade with no indications of anything proximal     Plan:  Advised on continuation of soaks and let it air dry at night and it should heal uneventfully.  Gave strict instructions of any increase in redness or drainage were to occur to be seen back immediately and patient will see Dr. March Rummage for postoperative visit

## 2018-02-09 ENCOUNTER — Ambulatory Visit (INDEPENDENT_AMBULATORY_CARE_PROVIDER_SITE_OTHER): Payer: Medicare Other | Admitting: *Deleted

## 2018-02-09 DIAGNOSIS — J309 Allergic rhinitis, unspecified: Secondary | ICD-10-CM

## 2018-02-10 ENCOUNTER — Other Ambulatory Visit: Payer: Self-pay | Admitting: Allergy and Immunology

## 2018-02-10 ENCOUNTER — Ambulatory Visit (INDEPENDENT_AMBULATORY_CARE_PROVIDER_SITE_OTHER): Payer: Medicare Other | Admitting: Podiatry

## 2018-02-10 DIAGNOSIS — L600 Ingrowing nail: Secondary | ICD-10-CM | POA: Diagnosis not present

## 2018-02-10 DIAGNOSIS — B351 Tinea unguium: Secondary | ICD-10-CM | POA: Diagnosis not present

## 2018-02-10 MED ORDER — SILVER SULFADIAZINE 1 % EX CREA
TOPICAL_CREAM | CUTANEOUS | 0 refills | Status: DC
Start: 2018-02-10 — End: 2019-08-14

## 2018-02-10 MED ORDER — DOXYCYCLINE HYCLATE 100 MG PO TABS: 100.0000 mg | ORAL_TABLET | Freq: Two times a day (BID) | ORAL | 0 refills | Status: DC

## 2018-02-10 NOTE — Patient Instructions (Signed)

## 2018-02-12 NOTE — Progress Notes (Signed)
  Subjective:  Patient ID: Emily Maxwell, female    DOB: [birth year],  MRN: [id number]  Chief Complaint  Patient presents with  . Ingrown Toenail    follow up ingrown toenail left - saw [facility] Monday - toe doesn't seem to be healing properly   77 y.o. female returns for the above complaint.  Believes the toe is not healing properly.  Still having some pain and redness  Objective:   General AA&O x3. Normal mood and affect.  Vascular Foot warm and well perfused with good capillary refill.  Neurologic Sensation grossly intact.  Dermatologic Nail avulsion site healing well without drainage.  Slight erythema. Nail bed with overlying soft crust. Left intact. No signs of local infection.  Orthopedic:  Slight tenderness to palpation of the toe.   Assessment & Plan:  Patient was evaluated and treated and all questions answered.  S/p Ingrown Toenail Excision, left -The toe does appear to be healing but with some slight redness.  Will provide Rx for antibiotic soaking.  Antibiotic ointment will follow-up next week to check status

## 2018-03-02 ENCOUNTER — Encounter: Payer: Self-pay | Admitting: Podiatry

## 2018-03-02 ENCOUNTER — Ambulatory Visit (INDEPENDENT_AMBULATORY_CARE_PROVIDER_SITE_OTHER): Payer: Medicare Other | Admitting: Podiatry

## 2018-03-02 ENCOUNTER — Ambulatory Visit (INDEPENDENT_AMBULATORY_CARE_PROVIDER_SITE_OTHER): Payer: Medicare Other

## 2018-03-02 DIAGNOSIS — J309 Allergic rhinitis, unspecified: Secondary | ICD-10-CM

## 2018-03-02 DIAGNOSIS — B351 Tinea unguium: Secondary | ICD-10-CM | POA: Diagnosis not present

## 2018-03-02 DIAGNOSIS — L600 Ingrowing nail: Secondary | ICD-10-CM | POA: Diagnosis not present

## 2018-03-02 MED ORDER — CICLOPIROX 8 % EX SOLN: Freq: Every day | CUTANEOUS | 2 refills | Status: DC

## 2018-03-02 NOTE — Progress Notes (Signed)
  Subjective:  Patient ID: Emily Maxwell, female    DOB: [birth year],  MRN: [id number]  Chief Complaint  Patient presents with  . Ingrown Toenail    Follow up nail procedure hallux left    "Its doing okay today"   77 y.o. female returns for the above complaint. States the nail doesn't hurt. Soaked as directed.  Objective:  There were no vitals filed for this visit. General AA&O x3. Normal mood and affect.  Vascular Pedal pulses palpable.  Neurologic Epicritic sensation grossly intact.  Dermatologic No open lesions. Skin normal texture and turgor. L hallux nail without ingrowing nail. Bed healing well. HPK L 3rd/4th met  Orthopedic: No pain to palpation either foot.   Assessment & Plan:  Patient was evaluated and treated and all questions answered.  Onychomycosis -Resume [place]  Ingrown Nail -Resolved.  HPK L 3rd/4th met -Courtesy debridement -Educated on self care -Recommend urea cream.   Return if symptoms worsen or fail to improve.

## 2018-03-09 ENCOUNTER — Ambulatory Visit (INDEPENDENT_AMBULATORY_CARE_PROVIDER_SITE_OTHER): Payer: Medicare Other | Admitting: *Deleted

## 2018-03-09 DIAGNOSIS — J309 Allergic rhinitis, unspecified: Secondary | ICD-10-CM

## 2018-03-20 ENCOUNTER — Ambulatory Visit: Payer: Self-pay

## 2018-03-28 ENCOUNTER — Ambulatory Visit (INDEPENDENT_AMBULATORY_CARE_PROVIDER_SITE_OTHER): Payer: Medicare Other | Admitting: *Deleted

## 2018-03-28 DIAGNOSIS — J309 Allergic rhinitis, unspecified: Secondary | ICD-10-CM

## 2018-04-04 ENCOUNTER — Ambulatory Visit (INDEPENDENT_AMBULATORY_CARE_PROVIDER_SITE_OTHER): Payer: Medicare Other | Admitting: *Deleted

## 2018-04-04 DIAGNOSIS — J309 Allergic rhinitis, unspecified: Secondary | ICD-10-CM

## 2018-04-11 ENCOUNTER — Ambulatory Visit (INDEPENDENT_AMBULATORY_CARE_PROVIDER_SITE_OTHER): Payer: Medicare Other | Admitting: *Deleted

## 2018-04-11 DIAGNOSIS — J309 Allergic rhinitis, unspecified: Secondary | ICD-10-CM | POA: Diagnosis not present

## 2018-04-18 ENCOUNTER — Ambulatory Visit (INDEPENDENT_AMBULATORY_CARE_PROVIDER_SITE_OTHER): Payer: Medicare Other | Admitting: *Deleted

## 2018-04-18 DIAGNOSIS — J309 Allergic rhinitis, unspecified: Secondary | ICD-10-CM | POA: Diagnosis not present

## 2018-04-26 ENCOUNTER — Ambulatory Visit (INDEPENDENT_AMBULATORY_CARE_PROVIDER_SITE_OTHER): Payer: Medicare Other | Admitting: *Deleted

## 2018-04-26 DIAGNOSIS — J309 Allergic rhinitis, unspecified: Secondary | ICD-10-CM

## 2018-05-01 DIAGNOSIS — H02889 Meibomian gland dysfunction of unspecified eye, unspecified eyelid: Secondary | ICD-10-CM | POA: Diagnosis not present

## 2018-05-01 DIAGNOSIS — H0012 Chalazion right lower eyelid: Secondary | ICD-10-CM | POA: Diagnosis not present

## 2018-05-02 ENCOUNTER — Ambulatory Visit (INDEPENDENT_AMBULATORY_CARE_PROVIDER_SITE_OTHER): Payer: Medicare Other | Admitting: *Deleted

## 2018-05-02 DIAGNOSIS — J309 Allergic rhinitis, unspecified: Secondary | ICD-10-CM

## 2018-05-11 ENCOUNTER — Ambulatory Visit (INDEPENDENT_AMBULATORY_CARE_PROVIDER_SITE_OTHER): Payer: Medicare Other | Admitting: *Deleted

## 2018-05-11 DIAGNOSIS — J309 Allergic rhinitis, unspecified: Secondary | ICD-10-CM | POA: Diagnosis not present

## 2018-05-15 DIAGNOSIS — Z01419 Encounter for gynecological examination (general) (routine) without abnormal findings: Secondary | ICD-10-CM | POA: Diagnosis not present

## 2018-05-15 DIAGNOSIS — Z1231 Encounter for screening mammogram for malignant neoplasm of breast: Secondary | ICD-10-CM | POA: Diagnosis not present

## 2018-05-15 DIAGNOSIS — Z6835 Body mass index (BMI) 35.0-35.9, adult: Secondary | ICD-10-CM | POA: Diagnosis not present

## 2018-05-18 DIAGNOSIS — I10 Essential (primary) hypertension: Secondary | ICD-10-CM | POA: Diagnosis not present

## 2018-05-18 DIAGNOSIS — E890 Postprocedural hypothyroidism: Secondary | ICD-10-CM | POA: Diagnosis not present

## 2018-05-18 DIAGNOSIS — M8580 Other specified disorders of bone density and structure, unspecified site: Secondary | ICD-10-CM | POA: Diagnosis not present

## 2018-05-18 DIAGNOSIS — Z6835 Body mass index (BMI) 35.0-35.9, adult: Secondary | ICD-10-CM | POA: Diagnosis not present

## 2018-05-23 DIAGNOSIS — H0012 Chalazion right lower eyelid: Secondary | ICD-10-CM | POA: Diagnosis not present

## 2018-05-30 ENCOUNTER — Ambulatory Visit (INDEPENDENT_AMBULATORY_CARE_PROVIDER_SITE_OTHER): Payer: Medicare Other

## 2018-05-30 DIAGNOSIS — J309 Allergic rhinitis, unspecified: Secondary | ICD-10-CM

## 2018-05-31 ENCOUNTER — Encounter: Payer: Self-pay | Admitting: *Deleted

## 2018-05-31 DIAGNOSIS — J3089 Other allergic rhinitis: Secondary | ICD-10-CM | POA: Diagnosis not present

## 2018-05-31 NOTE — Progress Notes (Signed)
Vials made. Exp: 06-01-19. hv 

## 2018-06-01 DIAGNOSIS — J301 Allergic rhinitis due to pollen: Secondary | ICD-10-CM | POA: Diagnosis not present

## 2018-06-06 ENCOUNTER — Ambulatory Visit (INDEPENDENT_AMBULATORY_CARE_PROVIDER_SITE_OTHER): Payer: Medicare Other | Admitting: *Deleted

## 2018-06-06 DIAGNOSIS — J309 Allergic rhinitis, unspecified: Secondary | ICD-10-CM

## 2018-06-14 ENCOUNTER — Ambulatory Visit (INDEPENDENT_AMBULATORY_CARE_PROVIDER_SITE_OTHER): Payer: Medicare Other

## 2018-06-14 DIAGNOSIS — J309 Allergic rhinitis, unspecified: Secondary | ICD-10-CM | POA: Diagnosis not present

## 2018-06-20 ENCOUNTER — Ambulatory Visit (INDEPENDENT_AMBULATORY_CARE_PROVIDER_SITE_OTHER): Payer: Medicare Other

## 2018-06-20 DIAGNOSIS — J309 Allergic rhinitis, unspecified: Secondary | ICD-10-CM | POA: Diagnosis not present

## 2018-06-26 DIAGNOSIS — Z23 Encounter for immunization: Secondary | ICD-10-CM | POA: Diagnosis not present

## 2018-06-27 ENCOUNTER — Ambulatory Visit (INDEPENDENT_AMBULATORY_CARE_PROVIDER_SITE_OTHER): Payer: Medicare Other | Admitting: *Deleted

## 2018-06-27 DIAGNOSIS — J309 Allergic rhinitis, unspecified: Secondary | ICD-10-CM | POA: Diagnosis not present

## 2018-07-11 ENCOUNTER — Ambulatory Visit (INDEPENDENT_AMBULATORY_CARE_PROVIDER_SITE_OTHER): Payer: Medicare Other | Admitting: *Deleted

## 2018-07-11 DIAGNOSIS — J309 Allergic rhinitis, unspecified: Secondary | ICD-10-CM

## 2018-07-18 ENCOUNTER — Ambulatory Visit (INDEPENDENT_AMBULATORY_CARE_PROVIDER_SITE_OTHER): Payer: Medicare Other | Admitting: *Deleted

## 2018-07-18 DIAGNOSIS — J309 Allergic rhinitis, unspecified: Secondary | ICD-10-CM

## 2018-08-01 DIAGNOSIS — J111 Influenza due to unidentified influenza virus with other respiratory manifestations: Secondary | ICD-10-CM | POA: Diagnosis not present

## 2018-08-01 DIAGNOSIS — J069 Acute upper respiratory infection, unspecified: Secondary | ICD-10-CM | POA: Diagnosis not present

## 2018-08-01 DIAGNOSIS — R05 Cough: Secondary | ICD-10-CM | POA: Diagnosis not present

## 2018-08-01 DIAGNOSIS — Z6833 Body mass index (BMI) 33.0-33.9, adult: Secondary | ICD-10-CM | POA: Diagnosis not present

## 2018-08-10 ENCOUNTER — Ambulatory Visit (INDEPENDENT_AMBULATORY_CARE_PROVIDER_SITE_OTHER): Payer: Medicare Other

## 2018-08-10 DIAGNOSIS — J309 Allergic rhinitis, unspecified: Secondary | ICD-10-CM | POA: Diagnosis not present

## 2018-08-15 DIAGNOSIS — M859 Disorder of bone density and structure, unspecified: Secondary | ICD-10-CM | POA: Diagnosis not present

## 2018-08-15 DIAGNOSIS — E041 Nontoxic single thyroid nodule: Secondary | ICD-10-CM | POA: Diagnosis not present

## 2018-08-15 DIAGNOSIS — N183 Chronic kidney disease, stage 3 (moderate): Secondary | ICD-10-CM | POA: Diagnosis not present

## 2018-08-15 DIAGNOSIS — E7849 Other hyperlipidemia: Secondary | ICD-10-CM | POA: Diagnosis not present

## 2018-08-15 DIAGNOSIS — R7301 Impaired fasting glucose: Secondary | ICD-10-CM | POA: Diagnosis not present

## 2018-08-15 DIAGNOSIS — R82998 Other abnormal findings in urine: Secondary | ICD-10-CM | POA: Diagnosis not present

## 2018-08-17 ENCOUNTER — Ambulatory Visit (INDEPENDENT_AMBULATORY_CARE_PROVIDER_SITE_OTHER): Payer: Medicare Other | Admitting: *Deleted

## 2018-08-17 DIAGNOSIS — J309 Allergic rhinitis, unspecified: Secondary | ICD-10-CM

## 2018-08-22 ENCOUNTER — Ambulatory Visit (HOSPITAL_COMMUNITY)
Admission: RE | Admit: 2018-08-22 | Discharge: 2018-08-22 | Disposition: A | Discharge status: Home / Self Care | Payer: Medicare Other | Source: Ambulatory Visit | Attending: Urology | Admitting: Urology

## 2018-08-22 ENCOUNTER — Other Ambulatory Visit: Payer: Self-pay | Admitting: Urology

## 2018-08-22 DIAGNOSIS — C652 Malignant neoplasm of left renal pelvis: Secondary | ICD-10-CM

## 2018-08-22 DIAGNOSIS — C642 Malignant neoplasm of left kidney, except renal pelvis: Secondary | ICD-10-CM | POA: Diagnosis not present

## 2018-08-23 DIAGNOSIS — I10 Essential (primary) hypertension: Secondary | ICD-10-CM | POA: Diagnosis not present

## 2018-08-23 DIAGNOSIS — E7849 Other hyperlipidemia: Secondary | ICD-10-CM | POA: Diagnosis not present

## 2018-08-23 DIAGNOSIS — R7301 Impaired fasting glucose: Secondary | ICD-10-CM | POA: Diagnosis not present

## 2018-08-23 DIAGNOSIS — Z0000 Encounter for general adult medical examination without abnormal findings: Secondary | ICD-10-CM | POA: Diagnosis not present

## 2018-08-23 DIAGNOSIS — J3089 Other allergic rhinitis: Secondary | ICD-10-CM | POA: Diagnosis not present

## 2018-08-23 DIAGNOSIS — Z1389 Encounter for screening for other disorder: Secondary | ICD-10-CM | POA: Diagnosis not present

## 2018-08-23 DIAGNOSIS — M859 Disorder of bone density and structure, unspecified: Secondary | ICD-10-CM | POA: Diagnosis not present

## 2018-08-23 DIAGNOSIS — E890 Postprocedural hypothyroidism: Secondary | ICD-10-CM | POA: Diagnosis not present

## 2018-08-23 DIAGNOSIS — J45998 Other asthma: Secondary | ICD-10-CM | POA: Diagnosis not present

## 2018-08-23 DIAGNOSIS — N183 Chronic kidney disease, stage 3 (moderate): Secondary | ICD-10-CM | POA: Diagnosis not present

## 2018-08-23 DIAGNOSIS — Z6833 Body mass index (BMI) 33.0-33.9, adult: Secondary | ICD-10-CM | POA: Diagnosis not present

## 2018-08-23 DIAGNOSIS — I712 Thoracic aortic aneurysm, without rupture: Secondary | ICD-10-CM | POA: Diagnosis not present

## 2018-08-24 DIAGNOSIS — C652 Malignant neoplasm of left renal pelvis: Secondary | ICD-10-CM | POA: Diagnosis not present

## 2018-08-24 DIAGNOSIS — K429 Umbilical hernia without obstruction or gangrene: Secondary | ICD-10-CM | POA: Diagnosis not present

## 2018-09-05 DIAGNOSIS — C652 Malignant neoplasm of left renal pelvis: Secondary | ICD-10-CM | POA: Diagnosis not present

## 2018-09-12 ENCOUNTER — Ambulatory Visit (INDEPENDENT_AMBULATORY_CARE_PROVIDER_SITE_OTHER): Payer: Medicare Other | Admitting: *Deleted

## 2018-09-12 DIAGNOSIS — J309 Allergic rhinitis, unspecified: Secondary | ICD-10-CM

## 2018-09-26 ENCOUNTER — Ambulatory Visit (INDEPENDENT_AMBULATORY_CARE_PROVIDER_SITE_OTHER): Payer: Medicare Other | Admitting: *Deleted

## 2018-09-26 DIAGNOSIS — J309 Allergic rhinitis, unspecified: Secondary | ICD-10-CM

## 2018-10-05 ENCOUNTER — Ambulatory Visit (INDEPENDENT_AMBULATORY_CARE_PROVIDER_SITE_OTHER): Payer: Medicare Other | Admitting: *Deleted

## 2018-10-05 DIAGNOSIS — J309 Allergic rhinitis, unspecified: Secondary | ICD-10-CM | POA: Diagnosis not present

## 2018-10-16 ENCOUNTER — Ambulatory Visit (INDEPENDENT_AMBULATORY_CARE_PROVIDER_SITE_OTHER): Payer: Medicare Other | Admitting: *Deleted

## 2018-10-16 DIAGNOSIS — J309 Allergic rhinitis, unspecified: Secondary | ICD-10-CM | POA: Diagnosis not present

## 2018-10-20 ENCOUNTER — Ambulatory Visit (INDEPENDENT_AMBULATORY_CARE_PROVIDER_SITE_OTHER): Payer: Medicare Other

## 2018-10-20 ENCOUNTER — Ambulatory Visit (INDEPENDENT_AMBULATORY_CARE_PROVIDER_SITE_OTHER): Payer: Medicare Other | Admitting: Podiatry

## 2018-10-20 DIAGNOSIS — S99929A Unspecified injury of unspecified foot, initial encounter: Secondary | ICD-10-CM

## 2018-10-20 DIAGNOSIS — S90212A Contusion of left great toe with damage to nail, initial encounter: Secondary | ICD-10-CM | POA: Diagnosis not present

## 2018-10-20 DIAGNOSIS — M79676 Pain in unspecified toe(s): Secondary | ICD-10-CM

## 2018-10-26 DIAGNOSIS — M79642 Pain in left hand: Secondary | ICD-10-CM | POA: Insufficient documentation

## 2018-10-29 NOTE — Progress Notes (Signed)
Subjective:  Patient ID: Emily Maxwell, female    DOB: 1941-09-15,  MRN: [id number]  Chief Complaint  Patient presents with  . Foot Pain     Great toe Hurting, Dropped Dish On Foot left x 2 weeks ago - hit great toe only right at the nail bed    78 y.o. female presents with the above complaint.    Review of Systems: Negative except as noted in the HPI. Denies N/V/F/Ch.  Past Medical History:  Diagnosis Date  . Aneurysm, ascending aorta ([place]) 2013   58mm  . Anxiety    hx of  anxiety , had stress test several years ago per patient that was normal   . Arthritis    thumbs , oa  . Ascending aorta dilation ([place]) 08/07/2012  . Asthma   . Asthma   . Cancer [facility])    cancer left kidney  . Cancer of kidney [facility])    Had right kidney removed  . Complication of anesthesia    pulse rate slow in past, did ok with recent anesthesia  . Deaf    left ear  . Dysrhythmia    occasional arrythmia, no meds taken for  . Hematuria    no blood seen recently  . Hematuria   . Hyperlipidemia   . Hyperplastic colon polyp   . Hypertension   . Hypothyroidism   . IFG (impaired fasting glucose)   . Nodule of left lung    stable, surveillance  Dr Brigitte Pulse  . Numbness    left thigh, when walking or standing for long periods of time  . Osteopenia   . RLS (restless legs syndrome)   . Rosacea   . Simple endometrial hyperplasia years ago  . Thoracic aortic aneurysm ([place])    stable per last chest ct 07-11-14 [facility] on chart-   . Vasovagal reaction    hx of     Current Outpatient Medications:  .  acetaminophen (TYLENOL) 325 MG tablet, Take 650 mg by mouth every 6 (six) hours as needed for mild pain or headache. , Disp: , Rfl:  .  Ascorbic Acid (VITAMIN C PO), Take by mouth., Disp: , Rfl:  .  BIOTIN PO, Take by mouth., Disp: , Rfl:  .  CALCIUM PO, Take by mouth., Disp: , Rfl:  .  Cholecalciferol (VITAMIN D PO), Take by mouth., Disp: , Rfl:  .  ciclopirox (PENLAC) 8 % solution, Apply topically at  bedtime. Apply over nail and surrounding skin. Apply daily over previous coat. Remove weekly with file or polish remover., Disp: 6.6 mL, Rfl: 2 .  desonide (DESOWEN) 0.05 % ointment, , Disp: , Rfl:  .  Diethylpropion HCl (TENUATE PO), Take by mouth daily., Disp: , Rfl:  .  doxycycline (VIBRA-TABS) 100 MG tablet, Take 1 tablet (100 mg total) by mouth 2 (two) times daily., Disp: 20 tablet, Rfl: 0 .  fexofenadine (ALLEGRA) 180 MG tablet, Take 180 mg by mouth daily as needed for allergies. As needed, Disp: , Rfl:  .  guaiFENesin (MUCINEX) 600 MG 12 hr tablet, Take 1,200 mg by mouth 2 (two) times daily as needed for cough or to loosen phlegm. , Disp: , Rfl:  .  irbesartan (AVAPRO) 150 MG tablet, Take 150 mg by mouth daily., Disp: , Rfl:  .  ketoconazole (NIZORAL) 2 % cream, , Disp: , Rfl:  .  levalbuterol (XOPENEX HFA) 45 MCG/ACT inhaler, Inhale 1-2 puffs into the lungs every 4 (four) hours as needed for wheezing., Disp:  1 Inhaler, Rfl: 0 .  levothyroxine (SYNTHROID, LEVOTHROID) 100 MCG tablet, Take 100 mcg by mouth daily., Disp: , Rfl:  .  loratadine (CLARITIN) 10 MG tablet, Take 10 mg by mouth daily. , Disp: , Rfl:  .  metroNIDAZOLE (METROCREAM) 0.75 % cream, , Disp: , Rfl:  .  montelukast (SINGULAIR) 10 MG tablet, Take 1 tablet (10 mg total) by mouth at bedtime. As needed, Disp: 30 tablet, Rfl: 0 .  montelukast (SINGULAIR) 10 MG tablet, TAKE 1 TABLET BY MOUTH DAILY AT BEDTIME AS NEEDED, Disp: 30 tablet, Rfl: 3 .  Nutritional Supplements (GRAPESEED EXTRACT PO), Take by mouth daily., Disp: , Rfl:  .  Omega-3 Fatty Acids (FISH OIL PO), Take by mouth., Disp: , Rfl:  .  PRESCRIPTION MEDICATION, Allergy shots 2 x per week either or Tuesday, Wednesday, or Thursday, Disp: , Rfl:  .  SHINGRIX injection, ADM 0.5ML IM UTD, Disp: , Rfl: 0 .  silver sulfADIAZINE (SILVADENE) 1 % cream, Apply small amount to toenail daily., Disp: 50 g, Rfl: 0 .  VITAMIN E PO, Take by mouth., Disp: , Rfl:  .  ZETIA 10 MG tablet,  Take 10 mg by mouth daily. , Disp: , Rfl:   Social History   Tobacco Use  Smoking Status Never Smoker  Smokeless Tobacco Never Used    Allergies  Allergen Reactions  . Avelox [Moxifloxacin Hcl In Nacl] Other (See Comments)    Unable to describe reaction  . Epinephrine Other (See Comments)    Makes heart rate increase and cold sweats   . Tramadol Other (See Comments)    Unable to describe reaction  . Penicillins Rash    Childhood rxn from weekly PCN shots w/ asthma Tx Has patient had a PCN reaction causing immediate rash, facial/tongue/throat swelling, SOB or lightheadedness with hypotension: unknown Has patient had a PCN reaction causing severe rash involving mucus membranes or skin necrosis: unknown Has patient had a PCN reaction that required hospitalization: no   Has patient had a PCN reaction occurring within the last 10 years: no If all of the above answers are "NO", then may proceed with Cephalosporin  . Sulfa Antibiotics Rash    Tolerates non-antibiotic sulfa drugs   Objective:  There were no vitals filed for this visit. There is no height or weight on file to calculate BMI. Constitutional Well developed. Well nourished.  Vascular Dorsalis pedis pulses palpable bilaterally. Posterior tibial pulses palpable bilaterally. Capillary refill normal to all digits.  No cyanosis or clubbing noted. Pedal hair growth normal.  Neurologic Normal speech. Oriented to person, place, and time. Epicritic sensation to light touch grossly present bilaterally.  Dermatologic Nails slightly contused ingrowing left Skin intact  Orthopedic: Normal joint ROM without pain or crepitus bilaterally. No visible deformities. No bony tenderness.   Radiographs: Taken and reviewed no acute fractures noted Assessment:   1. Contusion of left great toe with damage to nail, initial encounter   2. Pain around toenail    Plan:  Patient was evaluated and treated and all questions answered.  L  Foot Injury -X-rays taken reviewed no acute fracture noted -Nail gently debrided -Follow-up should pain persist  No follow-ups on file.

## 2018-11-01 DIAGNOSIS — M546 Pain in thoracic spine: Secondary | ICD-10-CM | POA: Diagnosis not present

## 2018-11-01 DIAGNOSIS — M9901 Segmental and somatic dysfunction of cervical region: Secondary | ICD-10-CM | POA: Diagnosis not present

## 2018-11-01 DIAGNOSIS — M9902 Segmental and somatic dysfunction of thoracic region: Secondary | ICD-10-CM | POA: Diagnosis not present

## 2018-11-01 DIAGNOSIS — M50122 Cervical disc disorder at C5-C6 level with radiculopathy: Secondary | ICD-10-CM | POA: Diagnosis not present

## 2018-11-02 ENCOUNTER — Ambulatory Visit (INDEPENDENT_AMBULATORY_CARE_PROVIDER_SITE_OTHER): Payer: Medicare Other | Admitting: *Deleted

## 2018-11-02 DIAGNOSIS — J309 Allergic rhinitis, unspecified: Secondary | ICD-10-CM

## 2018-11-06 DIAGNOSIS — M9902 Segmental and somatic dysfunction of thoracic region: Secondary | ICD-10-CM | POA: Diagnosis not present

## 2018-11-06 DIAGNOSIS — M50122 Cervical disc disorder at C5-C6 level with radiculopathy: Secondary | ICD-10-CM | POA: Diagnosis not present

## 2018-11-06 DIAGNOSIS — M546 Pain in thoracic spine: Secondary | ICD-10-CM | POA: Diagnosis not present

## 2018-11-06 DIAGNOSIS — M9901 Segmental and somatic dysfunction of cervical region: Secondary | ICD-10-CM | POA: Diagnosis not present

## 2018-11-09 DIAGNOSIS — M50122 Cervical disc disorder at C5-C6 level with radiculopathy: Secondary | ICD-10-CM | POA: Diagnosis not present

## 2018-11-09 DIAGNOSIS — M9902 Segmental and somatic dysfunction of thoracic region: Secondary | ICD-10-CM | POA: Diagnosis not present

## 2018-11-09 DIAGNOSIS — M9901 Segmental and somatic dysfunction of cervical region: Secondary | ICD-10-CM | POA: Diagnosis not present

## 2018-11-09 DIAGNOSIS — M546 Pain in thoracic spine: Secondary | ICD-10-CM | POA: Diagnosis not present

## 2018-11-13 DIAGNOSIS — M9902 Segmental and somatic dysfunction of thoracic region: Secondary | ICD-10-CM | POA: Diagnosis not present

## 2018-11-13 DIAGNOSIS — M9901 Segmental and somatic dysfunction of cervical region: Secondary | ICD-10-CM | POA: Diagnosis not present

## 2018-11-13 DIAGNOSIS — M546 Pain in thoracic spine: Secondary | ICD-10-CM | POA: Diagnosis not present

## 2018-11-13 DIAGNOSIS — M50122 Cervical disc disorder at C5-C6 level with radiculopathy: Secondary | ICD-10-CM | POA: Diagnosis not present

## 2018-11-14 ENCOUNTER — Ambulatory Visit (INDEPENDENT_AMBULATORY_CARE_PROVIDER_SITE_OTHER): Payer: Medicare Other | Admitting: *Deleted

## 2018-11-14 DIAGNOSIS — J309 Allergic rhinitis, unspecified: Secondary | ICD-10-CM | POA: Diagnosis not present

## 2018-11-15 DIAGNOSIS — M9901 Segmental and somatic dysfunction of cervical region: Secondary | ICD-10-CM | POA: Diagnosis not present

## 2018-11-15 DIAGNOSIS — M546 Pain in thoracic spine: Secondary | ICD-10-CM | POA: Diagnosis not present

## 2018-11-15 DIAGNOSIS — M9902 Segmental and somatic dysfunction of thoracic region: Secondary | ICD-10-CM | POA: Diagnosis not present

## 2018-11-15 DIAGNOSIS — M50122 Cervical disc disorder at C5-C6 level with radiculopathy: Secondary | ICD-10-CM | POA: Diagnosis not present

## 2018-11-16 DIAGNOSIS — L820 Inflamed seborrheic keratosis: Secondary | ICD-10-CM | POA: Diagnosis not present

## 2018-11-16 DIAGNOSIS — L718 Other rosacea: Secondary | ICD-10-CM | POA: Diagnosis not present

## 2018-11-16 DIAGNOSIS — D2271 Melanocytic nevi of right lower limb, including hip: Secondary | ICD-10-CM | POA: Diagnosis not present

## 2018-11-16 DIAGNOSIS — L814 Other melanin hyperpigmentation: Secondary | ICD-10-CM | POA: Diagnosis not present

## 2018-11-16 DIAGNOSIS — D225 Melanocytic nevi of trunk: Secondary | ICD-10-CM | POA: Diagnosis not present

## 2018-11-16 DIAGNOSIS — L720 Epidermal cyst: Secondary | ICD-10-CM | POA: Diagnosis not present

## 2018-11-16 DIAGNOSIS — D2261 Melanocytic nevi of right upper limb, including shoulder: Secondary | ICD-10-CM | POA: Diagnosis not present

## 2018-11-16 DIAGNOSIS — L821 Other seborrheic keratosis: Secondary | ICD-10-CM | POA: Diagnosis not present

## 2018-11-16 DIAGNOSIS — D485 Neoplasm of uncertain behavior of skin: Secondary | ICD-10-CM | POA: Diagnosis not present

## 2018-11-16 DIAGNOSIS — D1801 Hemangioma of skin and subcutaneous tissue: Secondary | ICD-10-CM | POA: Diagnosis not present

## 2018-11-16 DIAGNOSIS — L218 Other seborrheic dermatitis: Secondary | ICD-10-CM | POA: Diagnosis not present

## 2018-11-21 DIAGNOSIS — M9901 Segmental and somatic dysfunction of cervical region: Secondary | ICD-10-CM | POA: Diagnosis not present

## 2018-11-21 DIAGNOSIS — M50122 Cervical disc disorder at C5-C6 level with radiculopathy: Secondary | ICD-10-CM | POA: Diagnosis not present

## 2018-11-21 DIAGNOSIS — M546 Pain in thoracic spine: Secondary | ICD-10-CM | POA: Diagnosis not present

## 2018-11-21 DIAGNOSIS — M9902 Segmental and somatic dysfunction of thoracic region: Secondary | ICD-10-CM | POA: Diagnosis not present

## 2018-11-28 DIAGNOSIS — M9902 Segmental and somatic dysfunction of thoracic region: Secondary | ICD-10-CM | POA: Diagnosis not present

## 2018-11-28 DIAGNOSIS — M546 Pain in thoracic spine: Secondary | ICD-10-CM | POA: Diagnosis not present

## 2018-11-28 DIAGNOSIS — M9901 Segmental and somatic dysfunction of cervical region: Secondary | ICD-10-CM | POA: Diagnosis not present

## 2018-11-28 DIAGNOSIS — M50122 Cervical disc disorder at C5-C6 level with radiculopathy: Secondary | ICD-10-CM | POA: Diagnosis not present

## 2018-11-29 ENCOUNTER — Ambulatory Visit (INDEPENDENT_AMBULATORY_CARE_PROVIDER_SITE_OTHER): Payer: Medicare Other | Admitting: *Deleted

## 2018-11-29 DIAGNOSIS — J309 Allergic rhinitis, unspecified: Secondary | ICD-10-CM | POA: Diagnosis not present

## 2018-12-05 DIAGNOSIS — M9902 Segmental and somatic dysfunction of thoracic region: Secondary | ICD-10-CM | POA: Diagnosis not present

## 2018-12-05 DIAGNOSIS — M9901 Segmental and somatic dysfunction of cervical region: Secondary | ICD-10-CM | POA: Diagnosis not present

## 2018-12-05 DIAGNOSIS — M50122 Cervical disc disorder at C5-C6 level with radiculopathy: Secondary | ICD-10-CM | POA: Diagnosis not present

## 2018-12-05 DIAGNOSIS — M546 Pain in thoracic spine: Secondary | ICD-10-CM | POA: Diagnosis not present

## 2018-12-13 ENCOUNTER — Ambulatory Visit (INDEPENDENT_AMBULATORY_CARE_PROVIDER_SITE_OTHER): Payer: Medicare Other

## 2018-12-13 DIAGNOSIS — J309 Allergic rhinitis, unspecified: Secondary | ICD-10-CM | POA: Diagnosis not present

## 2018-12-14 DIAGNOSIS — M9901 Segmental and somatic dysfunction of cervical region: Secondary | ICD-10-CM | POA: Diagnosis not present

## 2018-12-14 DIAGNOSIS — M50122 Cervical disc disorder at C5-C6 level with radiculopathy: Secondary | ICD-10-CM | POA: Diagnosis not present

## 2018-12-14 DIAGNOSIS — M546 Pain in thoracic spine: Secondary | ICD-10-CM | POA: Diagnosis not present

## 2018-12-14 DIAGNOSIS — M9902 Segmental and somatic dysfunction of thoracic region: Secondary | ICD-10-CM | POA: Diagnosis not present

## 2018-12-21 ENCOUNTER — Ambulatory Visit (INDEPENDENT_AMBULATORY_CARE_PROVIDER_SITE_OTHER): Payer: Medicare Other | Admitting: *Deleted

## 2018-12-21 DIAGNOSIS — M546 Pain in thoracic spine: Secondary | ICD-10-CM | POA: Diagnosis not present

## 2018-12-21 DIAGNOSIS — M50122 Cervical disc disorder at C5-C6 level with radiculopathy: Secondary | ICD-10-CM | POA: Diagnosis not present

## 2018-12-21 DIAGNOSIS — M9901 Segmental and somatic dysfunction of cervical region: Secondary | ICD-10-CM | POA: Diagnosis not present

## 2018-12-21 DIAGNOSIS — J309 Allergic rhinitis, unspecified: Secondary | ICD-10-CM | POA: Diagnosis not present

## 2018-12-21 DIAGNOSIS — M9902 Segmental and somatic dysfunction of thoracic region: Secondary | ICD-10-CM | POA: Diagnosis not present

## 2018-12-26 DIAGNOSIS — M9902 Segmental and somatic dysfunction of thoracic region: Secondary | ICD-10-CM | POA: Diagnosis not present

## 2018-12-26 DIAGNOSIS — M50122 Cervical disc disorder at C5-C6 level with radiculopathy: Secondary | ICD-10-CM | POA: Diagnosis not present

## 2018-12-26 DIAGNOSIS — M546 Pain in thoracic spine: Secondary | ICD-10-CM | POA: Diagnosis not present

## 2018-12-26 DIAGNOSIS — M9901 Segmental and somatic dysfunction of cervical region: Secondary | ICD-10-CM | POA: Diagnosis not present

## 2019-01-02 ENCOUNTER — Ambulatory Visit (INDEPENDENT_AMBULATORY_CARE_PROVIDER_SITE_OTHER): Payer: Medicare Other | Admitting: *Deleted

## 2019-01-02 DIAGNOSIS — J309 Allergic rhinitis, unspecified: Secondary | ICD-10-CM | POA: Diagnosis not present

## 2019-01-11 NOTE — Progress Notes (Signed)
VIALS EXP 01-15-2020

## 2019-01-15 DIAGNOSIS — J3089 Other allergic rhinitis: Secondary | ICD-10-CM | POA: Diagnosis not present

## 2019-01-16 DIAGNOSIS — J301 Allergic rhinitis due to pollen: Secondary | ICD-10-CM | POA: Diagnosis not present

## 2019-04-10 DIAGNOSIS — L309 Dermatitis, unspecified: Secondary | ICD-10-CM | POA: Diagnosis not present

## 2019-04-10 DIAGNOSIS — S40862A Insect bite (nonvenomous) of left upper arm, initial encounter: Secondary | ICD-10-CM | POA: Diagnosis not present

## 2019-04-10 DIAGNOSIS — L738 Other specified follicular disorders: Secondary | ICD-10-CM | POA: Diagnosis not present

## 2019-06-11 ENCOUNTER — Ambulatory Visit (INDEPENDENT_AMBULATORY_CARE_PROVIDER_SITE_OTHER): Payer: Medicare Other | Admitting: Podiatry

## 2019-06-11 ENCOUNTER — Other Ambulatory Visit: Payer: Self-pay

## 2019-06-11 ENCOUNTER — Encounter: Payer: Self-pay | Admitting: Podiatry

## 2019-06-11 VITALS — Temp 98.2°F

## 2019-06-11 DIAGNOSIS — L03032 Cellulitis of left toe: Secondary | ICD-10-CM | POA: Diagnosis not present

## 2019-06-11 NOTE — Patient Instructions (Signed)

## 2019-07-21 DIAGNOSIS — Z23 Encounter for immunization: Secondary | ICD-10-CM | POA: Diagnosis not present

## 2019-08-06 DIAGNOSIS — H527 Unspecified disorder of refraction: Secondary | ICD-10-CM | POA: Diagnosis not present

## 2019-08-06 DIAGNOSIS — Z961 Presence of intraocular lens: Secondary | ICD-10-CM | POA: Diagnosis not present

## 2019-08-06 DIAGNOSIS — H35372 Puckering of macula, left eye: Secondary | ICD-10-CM | POA: Diagnosis not present

## 2019-08-14 ENCOUNTER — Ambulatory Visit (INDEPENDENT_AMBULATORY_CARE_PROVIDER_SITE_OTHER): Payer: Medicare Other | Admitting: Allergy and Immunology

## 2019-08-14 ENCOUNTER — Other Ambulatory Visit: Payer: Self-pay

## 2019-08-14 ENCOUNTER — Encounter: Payer: Self-pay | Admitting: Allergy and Immunology

## 2019-08-14 VITALS — BP 160/98 | HR 80 | Temp 98.3°F | Resp 16 | Ht 63.0 in | Wt 193.0 lb

## 2019-08-14 DIAGNOSIS — J309 Allergic rhinitis, unspecified: Secondary | ICD-10-CM

## 2019-08-14 DIAGNOSIS — J3089 Other allergic rhinitis: Secondary | ICD-10-CM | POA: Diagnosis not present

## 2019-08-14 DIAGNOSIS — J4520 Mild intermittent asthma, uncomplicated: Secondary | ICD-10-CM | POA: Diagnosis not present

## 2019-08-14 MED ORDER — MONTELUKAST SODIUM 10 MG PO TABS: ORAL_TABLET | ORAL | 5 refills | Status: DC

## 2019-08-14 MED ORDER — LEVALBUTEROL TARTRATE 45 MCG/ACT IN AERO: 1.0000 | INHALATION_SPRAY | RESPIRATORY_TRACT | 1 refills | Status: DC | PRN

## 2019-08-14 NOTE — Progress Notes (Signed)
[place]   Follow-up Note  Referring Provider: Marton Redwood, MD Primary Provider: Marton Redwood, MD Date of Office Visit: 08/14/2019  Subjective:   Emily Maxwell (DOB: 12-10-1940) is a 78 y.o. female who returns to the [facility] on 08/14/2019 in re-evaluation of the following:  HPI: Marcene Duos returns to this clinic in evaluation of mild intermittent asthma and allergic rhinitis treated with immunotherapy.  Her last visit to this clinic was 05 July 2017.  Immunotherapy has resulted in dramatic control of all of her atopic symptomatology and she does not require any additional medications at this point in time other than a Claritin while using immunotherapy.  On a very rare occasion she will take Singulair if she knows that she is going to go over to someone's house who has cats.  Rarely does she use a short acting bronchodilator and she has not required a systemic steroid or an antibiotic for any type of airway issue since her last visit.  Because of the coronavirus pandemic she stopped receiving immunotherapy in February and she would like to restart this form of treatment at this point in time.  She was using immunotherapy at full maintenance dose every week prior to the pandemic.  She did receive the flu vaccine this year.  Allergies as of 08/14/2019      Reactions   Avelox [moxifloxacin Hcl In Nacl] Other (See Comments)   Unable to describe reaction   Epinephrine Other (See Comments)   Makes heart rate increase and cold sweats    Tramadol Other (See Comments)   Unable to describe reaction   Penicillins Rash   Childhood rxn from weekly PCN shots w/ asthma Tx Has patient had a PCN reaction causing immediate rash, facial/tongue/throat swelling, SOB or lightheadedness with hypotension: unknown Has patient had a PCN reaction causing severe rash involving mucus membranes or skin necrosis: unknown Has patient had a  PCN reaction that required hospitalization: no   Has patient had a PCN reaction occurring within the last 10 years: no If all of the above answers are "NO", then may proceed with Cephalosporin   Sulfa Antibiotics Rash   Tolerates non-antibiotic sulfa drugs      Medication List    acetaminophen 325 MG tablet Commonly known as: TYLENOL Take 650 mg by mouth every 6 (six) hours as needed for mild pain or headache.   BIOTIN PO Take by mouth.   CALCIUM PO Take by mouth.   ciclopirox 8 % solution Commonly known as: PENLAC Apply topically at bedtime. Apply over nail and surrounding skin. Apply daily over previous coat. Remove weekly with file or polish remover.   desonide 0.05 % ointment Commonly known as: DESOWEN   doxycycline 100 MG tablet Commonly known as: VIBRA-TABS Take 1 tablet (100 mg total) by mouth 2 (two) times daily.   fexofenadine 180 MG tablet Commonly known as: ALLEGRA Take 180 mg by mouth daily as needed for allergies. As needed   FISH OIL PO Take by mouth.   GRAPESEED EXTRACT PO Take by mouth daily.   guaiFENesin 600 MG 12 hr tablet Commonly known as: MUCINEX Take 1,200 mg by mouth 2 (two) times daily as needed for cough or to loosen phlegm.   hydrochlorothiazide 12.5 MG tablet Commonly known as: HYDRODIURIL Take 12.5 mg by mouth daily.   ketoconazole 2 % cream Commonly known as: NIZORAL   levalbuterol 45 MCG/ACT inhaler Commonly known as: XOPENEX HFA Inhale  1-2 puffs into the lungs every 4 (four) hours as needed for wheezing.   levothyroxine 100 MCG tablet Commonly known as: SYNTHROID Take 125 mcg by mouth daily.   loratadine 10 MG tablet Commonly known as: CLARITIN Take 10 mg by mouth daily.   metroNIDAZOLE 0.75 % cream Commonly known as: METROCREAM   PRESCRIPTION MEDICATION Allergy shots 2 x per week either or Tuesday, Wednesday, or Thursday   Shingrix injection Generic drug: Zoster Vaccine Adjuvanted ADM 0.5ML IM UTD   VITAMIN C PO  Take by mouth.   VITAMIN D PO Take by mouth.   VITAMIN E PO Take by mouth.   Zetia 10 MG tablet Generic drug: ezetimibe Take 10 mg by mouth daily.       Past Medical History:  Diagnosis Date  . Aneurysm, ascending aorta ([place]) 2013   32mm  . Anxiety    hx of  anxiety , had stress test several years ago per patient that was normal   . Arthritis    thumbs , oa  . Ascending aorta dilation ([place]) 08/07/2012  . Asthma   . Asthma   . Cancer [facility])    cancer left kidney  . Cancer of kidney [facility])    Had right kidney removed  . Complication of anesthesia    pulse rate slow in past, did ok with recent anesthesia  . Deaf    left ear  . Dysrhythmia    occasional arrythmia, no meds taken for  . Hematuria    no blood seen recently  . Hematuria   . Hyperlipidemia   . Hyperplastic colon polyp   . Hypertension   . Hypothyroidism   . IFG (impaired fasting glucose)   . Nodule of left lung    stable, surveillance  Dr Brigitte Pulse  . Numbness    left thigh, when walking or standing for long periods of time  . Osteopenia   . RLS (restless legs syndrome)   . Rosacea   . Simple endometrial hyperplasia years ago  . Thoracic aortic aneurysm ([place])    stable per last chest ct 07-11-14 [facility] on chart-   . Vasovagal reaction    hx of     Past Surgical History:  Procedure Laterality Date  . acoustic neuroma S/P resection Lt ear deafness 1990     tinnitus  from left ear  . BTL    . bumonectomy Bilateral    x 2  . CATARACT EXTRACTION Bilateral 2004  . CRANIECTOMY FOR EXCISION OF ACOUSTIC NEUROMA Left 1990   Deaf on Left side  . CYSTOSCOPY WITH RETROGRADE PYELOGRAM, URETEROSCOPY AND STENT PLACEMENT Bilateral 08/07/2015   Procedure: CYSTOSCOPY WITH BILATERAL RETROGRADE PYELOGRAM, LEFT URETEROSCOPY WITH BIOPSY AND  LEFT STENT PLACEMENT;  Surgeon: Alexis Frock, MD;  Location: [facility];  Service: Urology;  Laterality: Bilateral;  . EYE SURGERY Bilateral 2013   eyelid drooping fixed  .  EYE SURGERY Bilateral    yag procedure after cataracts  . left hand middle finger surgery   2015    cadaver bone placed   . left shoulder surgery 2005    . Lt knee surgery 2008 Left 2008   tears twice one surgery  . lt wrist surgery 2005    . mortone neuroma  2010   left foot 3rd and 4th toe  . ROBOT ASSITED LAPAROSCOPIC NEPHROURETERECTOMY Left 09/19/2015   Procedure: ROBOT ASSITED LAPAROSCOPIC NEPHROURETERECTOMY WITH DIAPHRAMATIC HERNIA REPAIR;  Surgeon: Alexis Frock, MD;  Location: [facility];  Service:  Urology;  Laterality: Left;  . SP CHOLECYSTOMY    . THYROIDECTOMY  11/2009   S/P Adenoma follicular vs hyperplastic  . TONSILLECTOMY  age 48  . TUBAL LIGATION  1976  . WRIST SURGERY Left 2007   wrist cyst removed    Review of systems negative except as noted in HPI / PMHx or noted below:  Review of Systems  Constitutional: Negative.   HENT: Negative.   Eyes: Negative.   Respiratory: Negative.   Cardiovascular: Negative.   Gastrointestinal: Negative.   Genitourinary: Negative.   Musculoskeletal: Negative.   Skin: Negative.   Neurological: Negative.   Endo/Heme/Allergies: Negative.   Psychiatric/Behavioral: Negative.      Objective:   Vitals:   08/14/19 1136  BP: (!) 160/98  Pulse: 80  Resp: 16  Temp: 98.3 F (36.8 C)  SpO2: 98%   Height: 5\' 3"  (160 cm)  Weight: 193 lb (87.5 kg)   Physical Exam Constitutional:      Appearance: She is not diaphoretic.  HENT:     Head: Normocephalic.     Right Ear: Tympanic membrane, ear canal and external ear normal.     Left Ear: Tympanic membrane, ear canal and external ear normal.     Nose: Nose normal. No mucosal edema or rhinorrhea.     Mouth/Throat:     Pharynx: Uvula midline. No oropharyngeal exudate.  Eyes:     Conjunctiva/sclera: Conjunctivae normal.  Neck:     Thyroid: No thyromegaly.     Trachea: Trachea normal. No tracheal tenderness or tracheal deviation.  Cardiovascular:     Rate and Rhythm: Normal rate and  regular rhythm.     Heart sounds: Normal heart sounds, S1 normal and S2 normal. No murmur.  Pulmonary:     Effort: No respiratory distress.     Breath sounds: Normal breath sounds. No stridor. No wheezing or rales.  Lymphadenopathy:     Head:     Right side of head: No tonsillar adenopathy.     Left side of head: No tonsillar adenopathy.     Cervical: No cervical adenopathy.  Skin:    Findings: No erythema or rash.     Nails: There is no clubbing.   Neurological:     Mental Status: She is alert.     Diagnostics:    Spirometry was performed and demonstrated an FEV1 of 1.91 at 100 % of predicted.  Assessment and Plan:   1. Asthma, mild intermittent, well-controlled   2. Perennial allergic rhinitis     1. Restart immunotherapy at 0.05 red vial and build up with schedule C  2. If needed:              A. OTC antihistamine - Claritin 10 mg - 1 time per day             B. Xopenex HFA 2 puffs every 4-6 hours             C. Epi-Pen  3. Obtain Covid vaccine when available  4. Return to clinic in 1 year or earlier if needed  We will reinitiate Emily Maxwell's immunotherapy and she has a selection of agents to utilize should they be required.  I will see her back in this clinic in 1 year or earlier if there is a problem.  Allena Katz, MD Allergy / Immunology [place]

## 2019-08-14 NOTE — Patient Instructions (Addendum)
  1. Restart immunotherapy at 0.05 red vial and build up with schedule C  2. If needed:   A. OTC antihistamine - Claritin 10 mg - 1 time per day  B. Xopenex HFA 2 puffs every 4-6 hours  C.  Montelukast 10 mg -1 tablet 1 time per day   D.  EpiPen  3. Obtain Covid vaccine when available  4. Return to clinic in 1 year or earlier if needed

## 2019-08-15 ENCOUNTER — Encounter: Payer: Self-pay | Admitting: Allergy and Immunology

## 2019-08-21 ENCOUNTER — Ambulatory Visit (INDEPENDENT_AMBULATORY_CARE_PROVIDER_SITE_OTHER): Payer: Medicare Other | Admitting: *Deleted

## 2019-08-21 DIAGNOSIS — J309 Allergic rhinitis, unspecified: Secondary | ICD-10-CM | POA: Diagnosis not present

## 2019-08-23 DIAGNOSIS — R7301 Impaired fasting glucose: Secondary | ICD-10-CM | POA: Diagnosis not present

## 2019-08-23 DIAGNOSIS — E7849 Other hyperlipidemia: Secondary | ICD-10-CM | POA: Diagnosis not present

## 2019-08-23 DIAGNOSIS — M859 Disorder of bone density and structure, unspecified: Secondary | ICD-10-CM | POA: Diagnosis not present

## 2019-08-23 DIAGNOSIS — E890 Postprocedural hypothyroidism: Secondary | ICD-10-CM | POA: Diagnosis not present

## 2019-08-24 ENCOUNTER — Other Ambulatory Visit (HOSPITAL_COMMUNITY): Payer: Self-pay | Admitting: Urology

## 2019-08-24 ENCOUNTER — Other Ambulatory Visit: Payer: Self-pay

## 2019-08-24 ENCOUNTER — Ambulatory Visit (HOSPITAL_COMMUNITY)
Admission: RE | Admit: 2019-08-24 | Discharge: 2019-08-24 | Disposition: A | Discharge status: Home / Self Care | Payer: Medicare Other | Source: Ambulatory Visit | Attending: Urology | Admitting: Urology

## 2019-08-24 DIAGNOSIS — C652 Malignant neoplasm of left renal pelvis: Secondary | ICD-10-CM

## 2019-08-30 DIAGNOSIS — E890 Postprocedural hypothyroidism: Secondary | ICD-10-CM | POA: Diagnosis not present

## 2019-08-30 DIAGNOSIS — R7301 Impaired fasting glucose: Secondary | ICD-10-CM | POA: Diagnosis not present

## 2019-08-30 DIAGNOSIS — Z1331 Encounter for screening for depression: Secondary | ICD-10-CM | POA: Diagnosis not present

## 2019-08-30 DIAGNOSIS — J309 Allergic rhinitis, unspecified: Secondary | ICD-10-CM | POA: Diagnosis not present

## 2019-08-30 DIAGNOSIS — I10 Essential (primary) hypertension: Secondary | ICD-10-CM | POA: Diagnosis not present

## 2019-08-30 DIAGNOSIS — Z1339 Encounter for screening examination for other mental health and behavioral disorders: Secondary | ICD-10-CM | POA: Diagnosis not present

## 2019-08-30 DIAGNOSIS — Z0000 Encounter for general adult medical examination without abnormal findings: Secondary | ICD-10-CM | POA: Diagnosis not present

## 2019-08-30 DIAGNOSIS — M8580 Other specified disorders of bone density and structure, unspecified site: Secondary | ICD-10-CM | POA: Diagnosis not present

## 2019-08-30 DIAGNOSIS — I712 Thoracic aortic aneurysm, without rupture: Secondary | ICD-10-CM | POA: Diagnosis not present

## 2019-08-30 DIAGNOSIS — E669 Obesity, unspecified: Secondary | ICD-10-CM | POA: Diagnosis not present

## 2019-08-30 DIAGNOSIS — Z85528 Personal history of other malignant neoplasm of kidney: Secondary | ICD-10-CM | POA: Diagnosis not present

## 2019-08-30 DIAGNOSIS — J45909 Unspecified asthma, uncomplicated: Secondary | ICD-10-CM | POA: Diagnosis not present

## 2019-08-30 DIAGNOSIS — N1831 Chronic kidney disease, stage 3a: Secondary | ICD-10-CM | POA: Diagnosis not present

## 2019-09-04 ENCOUNTER — Ambulatory Visit (INDEPENDENT_AMBULATORY_CARE_PROVIDER_SITE_OTHER): Payer: Medicare Other | Admitting: *Deleted

## 2019-09-04 DIAGNOSIS — J309 Allergic rhinitis, unspecified: Secondary | ICD-10-CM

## 2019-09-05 DIAGNOSIS — C652 Malignant neoplasm of left renal pelvis: Secondary | ICD-10-CM | POA: Diagnosis not present

## 2019-09-10 DIAGNOSIS — C652 Malignant neoplasm of left renal pelvis: Secondary | ICD-10-CM | POA: Diagnosis not present

## 2019-09-10 DIAGNOSIS — Z905 Acquired absence of kidney: Secondary | ICD-10-CM | POA: Diagnosis not present

## 2019-10-23 ENCOUNTER — Ambulatory Visit (INDEPENDENT_AMBULATORY_CARE_PROVIDER_SITE_OTHER): Payer: Medicare Other

## 2019-10-23 DIAGNOSIS — J309 Allergic rhinitis, unspecified: Secondary | ICD-10-CM

## 2019-10-26 ENCOUNTER — Ambulatory Visit: Payer: Medicare Other | Admitting: Podiatry

## 2019-10-26 DIAGNOSIS — Z1212 Encounter for screening for malignant neoplasm of rectum: Secondary | ICD-10-CM | POA: Diagnosis not present

## 2019-10-27 ENCOUNTER — Ambulatory Visit: Payer: Medicare Other | Attending: Internal Medicine

## 2019-10-27 DIAGNOSIS — Z23 Encounter for immunization: Secondary | ICD-10-CM | POA: Insufficient documentation

## 2019-10-27 NOTE — Progress Notes (Signed)
   Covid-19 Vaccination Clinic  Name:  Emily Maxwell    MRN: [id number] DOB: 01/20/1941  10/27/2019  [name] was observed post Covid-19 immunization for 15 minutes without incidence. She was provided with Vaccine Information Sheet and instruction to access the V-Safe system.   [name] was instructed to call 911 with any severe reactions post vaccine: Marland Kitchen Difficulty breathing  . Swelling of your face and throat  . A fast heartbeat  . A bad rash all over your body  . Dizziness and weakness    Immunizations Administered    Name Date Dose VIS Date Route   Pfizer COVID-19 Vaccine 10/27/2019 12:14 PM 0.3 mL 09/28/2019 Intramuscular   Manufacturer: [employer]   Lot: [id number]   [place]: [id number]

## 2019-10-30 ENCOUNTER — Ambulatory Visit (INDEPENDENT_AMBULATORY_CARE_PROVIDER_SITE_OTHER): Payer: Medicare Other

## 2019-10-30 DIAGNOSIS — J309 Allergic rhinitis, unspecified: Secondary | ICD-10-CM | POA: Diagnosis not present

## 2019-11-08 ENCOUNTER — Ambulatory Visit: Payer: Medicare Other | Admitting: Podiatry

## 2019-11-14 ENCOUNTER — Ambulatory Visit: Payer: Medicare Other

## 2019-11-17 ENCOUNTER — Ambulatory Visit: Payer: Medicare Other | Attending: Internal Medicine

## 2019-11-17 DIAGNOSIS — Z23 Encounter for immunization: Secondary | ICD-10-CM | POA: Insufficient documentation

## 2019-11-17 NOTE — Progress Notes (Signed)
   Covid-19 Vaccination Clinic  Name:  Emily Maxwell    MRN: [id number] DOB: 1941-09-25  11/17/2019  [name] was observed post Covid-19 immunization for 15 minutes without incidence. She was provided with Vaccine Information Sheet and instruction to access the V-Safe system.   [name] was instructed to call 911 with any severe reactions post vaccine: Marland Kitchen Difficulty breathing  . Swelling of your face and throat  . A fast heartbeat  . A bad rash all over your body  . Dizziness and weakness    Immunizations Administered    Name Date Dose VIS Date Route   Pfizer COVID-19 Vaccine 11/17/2019  1:31 PM 0.3 mL 09/28/2019 Intramuscular   Manufacturer: [employer]   Lot: [id number]   [place]: [id number]

## 2019-11-22 ENCOUNTER — Ambulatory Visit (INDEPENDENT_AMBULATORY_CARE_PROVIDER_SITE_OTHER): Payer: Medicare Other

## 2019-11-22 DIAGNOSIS — J309 Allergic rhinitis, unspecified: Secondary | ICD-10-CM | POA: Diagnosis not present

## 2019-11-26 NOTE — Progress Notes (Signed)
EXP 11/26/20

## 2019-11-27 DIAGNOSIS — L821 Other seborrheic keratosis: Secondary | ICD-10-CM | POA: Diagnosis not present

## 2019-11-27 DIAGNOSIS — D225 Melanocytic nevi of trunk: Secondary | ICD-10-CM | POA: Diagnosis not present

## 2019-11-27 DIAGNOSIS — L720 Epidermal cyst: Secondary | ICD-10-CM | POA: Diagnosis not present

## 2019-11-27 DIAGNOSIS — J301 Allergic rhinitis due to pollen: Secondary | ICD-10-CM | POA: Diagnosis not present

## 2019-11-27 DIAGNOSIS — L814 Other melanin hyperpigmentation: Secondary | ICD-10-CM | POA: Diagnosis not present

## 2019-11-27 DIAGNOSIS — L718 Other rosacea: Secondary | ICD-10-CM | POA: Diagnosis not present

## 2019-11-28 DIAGNOSIS — J3089 Other allergic rhinitis: Secondary | ICD-10-CM | POA: Diagnosis not present

## 2019-11-29 ENCOUNTER — Ambulatory Visit (INDEPENDENT_AMBULATORY_CARE_PROVIDER_SITE_OTHER): Payer: Medicare Other

## 2019-11-29 DIAGNOSIS — J309 Allergic rhinitis, unspecified: Secondary | ICD-10-CM | POA: Diagnosis not present

## 2019-12-12 ENCOUNTER — Ambulatory Visit (INDEPENDENT_AMBULATORY_CARE_PROVIDER_SITE_OTHER): Payer: Medicare Other | Admitting: *Deleted

## 2019-12-12 DIAGNOSIS — J309 Allergic rhinitis, unspecified: Secondary | ICD-10-CM

## 2019-12-19 ENCOUNTER — Ambulatory Visit (INDEPENDENT_AMBULATORY_CARE_PROVIDER_SITE_OTHER): Payer: Medicare Other

## 2019-12-19 DIAGNOSIS — J309 Allergic rhinitis, unspecified: Secondary | ICD-10-CM

## 2019-12-31 ENCOUNTER — Ambulatory Visit (INDEPENDENT_AMBULATORY_CARE_PROVIDER_SITE_OTHER): Payer: Medicare Other

## 2019-12-31 DIAGNOSIS — J309 Allergic rhinitis, unspecified: Secondary | ICD-10-CM | POA: Diagnosis not present

## 2020-01-01 DIAGNOSIS — M65311 Trigger thumb, right thumb: Secondary | ICD-10-CM | POA: Diagnosis not present

## 2020-01-01 DIAGNOSIS — M25562 Pain in left knee: Secondary | ICD-10-CM | POA: Diagnosis not present

## 2020-01-01 DIAGNOSIS — R1011 Right upper quadrant pain: Secondary | ICD-10-CM | POA: Diagnosis not present

## 2020-01-11 ENCOUNTER — Ambulatory Visit (INDEPENDENT_AMBULATORY_CARE_PROVIDER_SITE_OTHER): Payer: Medicare Other | Admitting: *Deleted

## 2020-01-11 DIAGNOSIS — J309 Allergic rhinitis, unspecified: Secondary | ICD-10-CM | POA: Diagnosis not present

## 2020-01-24 ENCOUNTER — Ambulatory Visit (INDEPENDENT_AMBULATORY_CARE_PROVIDER_SITE_OTHER): Payer: Medicare Other

## 2020-01-24 DIAGNOSIS — J309 Allergic rhinitis, unspecified: Secondary | ICD-10-CM | POA: Diagnosis not present

## 2020-02-06 ENCOUNTER — Ambulatory Visit (INDEPENDENT_AMBULATORY_CARE_PROVIDER_SITE_OTHER): Payer: Medicare Other

## 2020-02-06 DIAGNOSIS — J309 Allergic rhinitis, unspecified: Secondary | ICD-10-CM

## 2020-02-08 DIAGNOSIS — M25562 Pain in left knee: Secondary | ICD-10-CM | POA: Diagnosis not present

## 2020-02-08 DIAGNOSIS — I10 Essential (primary) hypertension: Secondary | ICD-10-CM | POA: Diagnosis not present

## 2020-02-08 DIAGNOSIS — E890 Postprocedural hypothyroidism: Secondary | ICD-10-CM | POA: Diagnosis not present

## 2020-02-08 DIAGNOSIS — M1990 Unspecified osteoarthritis, unspecified site: Secondary | ICD-10-CM | POA: Diagnosis not present

## 2020-02-08 DIAGNOSIS — M19041 Primary osteoarthritis, right hand: Secondary | ICD-10-CM | POA: Diagnosis not present

## 2020-02-20 ENCOUNTER — Ambulatory Visit (INDEPENDENT_AMBULATORY_CARE_PROVIDER_SITE_OTHER): Payer: Medicare Other | Admitting: *Deleted

## 2020-02-20 DIAGNOSIS — J309 Allergic rhinitis, unspecified: Secondary | ICD-10-CM

## 2020-02-21 DIAGNOSIS — M79644 Pain in right finger(s): Secondary | ICD-10-CM | POA: Diagnosis not present

## 2020-02-21 DIAGNOSIS — M65311 Trigger thumb, right thumb: Secondary | ICD-10-CM | POA: Diagnosis not present

## 2020-02-21 DIAGNOSIS — M79645 Pain in left finger(s): Secondary | ICD-10-CM | POA: Diagnosis not present

## 2020-02-21 DIAGNOSIS — M65312 Trigger thumb, left thumb: Secondary | ICD-10-CM | POA: Diagnosis not present

## 2020-03-04 ENCOUNTER — Ambulatory Visit (INDEPENDENT_AMBULATORY_CARE_PROVIDER_SITE_OTHER): Payer: Medicare Other

## 2020-03-04 DIAGNOSIS — J309 Allergic rhinitis, unspecified: Secondary | ICD-10-CM

## 2020-03-25 DIAGNOSIS — S1086XA Insect bite of other specified part of neck, initial encounter: Secondary | ICD-10-CM | POA: Diagnosis not present

## 2020-03-25 DIAGNOSIS — L308 Other specified dermatitis: Secondary | ICD-10-CM | POA: Diagnosis not present

## 2020-03-28 DIAGNOSIS — H0012 Chalazion right lower eyelid: Secondary | ICD-10-CM | POA: Diagnosis not present

## 2020-04-24 ENCOUNTER — Ambulatory Visit: Payer: Medicare Other

## 2020-04-24 ENCOUNTER — Other Ambulatory Visit: Payer: Self-pay

## 2020-04-24 ENCOUNTER — Ambulatory Visit (INDEPENDENT_AMBULATORY_CARE_PROVIDER_SITE_OTHER): Payer: Medicare Other | Admitting: Podiatry

## 2020-04-24 ENCOUNTER — Encounter: Payer: Self-pay | Admitting: Podiatry

## 2020-04-24 DIAGNOSIS — L84 Corns and callosities: Secondary | ICD-10-CM

## 2020-04-24 DIAGNOSIS — B079 Viral wart, unspecified: Secondary | ICD-10-CM | POA: Diagnosis not present

## 2020-04-24 NOTE — Progress Notes (Signed)
Subjective:   Patient ID: Emily Maxwell, female   DOB: 79 y.o.   MRN: [id number]   HPI Patient states that she has developed a significant callus that is painful left plantar foot over the last couple months.  States it feels like she is walking on a rock   ROS      Objective:  Physical Exam  Neurovascular status intact with keratotic lesions subleft forefoot that upon debridement shows pinpoint bleeding with mild discomfort to lateral pressure.  Patient has good digital perfusion well oriented x3     Assessment:  Possibility for verruca plantaris versus plantarflexed metatarsal with corn callus formation     Plan:  H&P educated her on conditions and were going to go ahead and treat as wart.  I did deep debridement of lesion I applied a new nature to create response applied sterile dressing instructed what to do if any blistering were to occur and to leave dressing on for 24 to 48 hours.  Reappoint as symptoms indicate

## 2020-05-12 DIAGNOSIS — E890 Postprocedural hypothyroidism: Secondary | ICD-10-CM | POA: Diagnosis not present

## 2020-06-16 DIAGNOSIS — Z23 Encounter for immunization: Secondary | ICD-10-CM | POA: Diagnosis not present

## 2020-07-03 DIAGNOSIS — R05 Cough: Secondary | ICD-10-CM | POA: Diagnosis not present

## 2020-07-03 DIAGNOSIS — R509 Fever, unspecified: Secondary | ICD-10-CM | POA: Diagnosis not present

## 2020-07-03 DIAGNOSIS — J309 Allergic rhinitis, unspecified: Secondary | ICD-10-CM | POA: Diagnosis not present

## 2020-07-03 DIAGNOSIS — J45909 Unspecified asthma, uncomplicated: Secondary | ICD-10-CM | POA: Diagnosis not present

## 2020-07-08 ENCOUNTER — Other Ambulatory Visit: Payer: Self-pay

## 2020-07-08 ENCOUNTER — Ambulatory Visit (INDEPENDENT_AMBULATORY_CARE_PROVIDER_SITE_OTHER): Payer: Medicare Other | Admitting: Allergy and Immunology

## 2020-07-08 ENCOUNTER — Encounter: Payer: Self-pay | Admitting: Allergy and Immunology

## 2020-07-08 VITALS — BP 126/82 | HR 67 | Temp 97.6°F | Resp 16 | Ht 63.0 in | Wt 193.2 lb

## 2020-07-08 DIAGNOSIS — J3089 Other allergic rhinitis: Secondary | ICD-10-CM | POA: Diagnosis not present

## 2020-07-08 DIAGNOSIS — J4521 Mild intermittent asthma with (acute) exacerbation: Secondary | ICD-10-CM | POA: Diagnosis not present

## 2020-07-08 DIAGNOSIS — L308 Other specified dermatitis: Secondary | ICD-10-CM | POA: Diagnosis not present

## 2020-07-08 DIAGNOSIS — L989 Disorder of the skin and subcutaneous tissue, unspecified: Secondary | ICD-10-CM

## 2020-07-08 MED ORDER — METHYLPREDNISOLONE ACETATE 80 MG/ML IJ SUSP
80.0000 mg | Freq: Once | INTRAMUSCULAR | Status: AC
  Administered 2020-07-08: 80 mg via INTRAMUSCULAR

## 2020-07-08 MED ORDER — MONTELUKAST SODIUM 10 MG PO TABS: ORAL_TABLET | ORAL | 5 refills | Status: DC

## 2020-07-08 MED ORDER — EPINEPHRINE 0.3 MG/0.3ML IJ SOAJ: 0.3000 mg | INTRAMUSCULAR | 1 refills | Status: DC | PRN

## 2020-07-08 MED ORDER — LEVALBUTEROL TARTRATE 45 MCG/ACT IN AERO: 1.0000 | INHALATION_SPRAY | RESPIRATORY_TRACT | 5 refills | Status: DC | PRN

## 2020-07-08 NOTE — Patient Instructions (Addendum)
  1. Restart immunotherapy at 0.05 red vial and build up with schedule C  2. If needed:   A. OTC antihistamine - Claritin 10 mg - 1 time per day  B. Xopenex HFA 2 puffs every 4-6 hours  C.  Montelukast 10 mg -1 tablet 1 time per day   D.  EpiPen  3.  Depo-Medrol 80 IM delivered in clinic today  4. Return to clinic in 1 year or earlier if needed  5.  Obtain fall flu vaccine

## 2020-07-08 NOTE — Progress Notes (Signed)
[place]   Follow-up Note  Referring Provider: Marton Redwood, MD Primary Provider: Marton Redwood, MD Date of Office Visit: 07/08/2020  Subjective:   Emily Maxwell (DOB: 14-Apr-1941) is a 79 y.o. female who returns to the [facility] on 07/08/2020 in re-evaluation of the following:  HPI: Emily Maxwell returns to this clinic in evaluation of mild intermittent asthma and allergic rhinitis treated with immunotherapy.  Her last visit to this clinic was 14 August 2019.  She has done very well since her last visit but unfortunately over the course of the past 2 months or so while being exposed to construction dust and moving from one household to another she has developed a cough.  This is her usual "asthmatic cough".  Fortunately, she does not have a significant amount of shortness of breath or chest tightness or any additional respiratory tract symptoms associated with this cough.  She is asking for the "magic shot" to address this issue.  It should be noted that she has had excellent control of her asthma for years and has not required any systemic steroid or antibiotic for any type of airway issue in years and rarely uses a short acting bronchodilator.  In addition, she has had a small dermatitis develop manifested as very tiny red dots that appear to occur on her thigh that are somewhat itchy over the course of the past 10 days.  She only has about 6 of these.  There is no obvious reason for why these have developed.  There does not appear to be an issue with bedbugs.  No other person in the household has had a dermatitis.  Once again there is no associated systemic or constitutional symptoms associated with this dermatitis.  There is no obvious trigger giving rise to this dermatitis.  She has had 3 Pfizer Covid vaccinations.  Allergies as of 07/08/2020      Reactions   Avelox [moxifloxacin Hcl In Nacl] Other (See Comments)   Unable to  describe reaction   Epinephrine Other (See Comments)   Makes heart rate increase and cold sweats    Tramadol Other (See Comments)   Unable to describe reaction   Penicillins Rash   Childhood rxn from weekly PCN shots w/ asthma Tx Has patient had a PCN reaction causing immediate rash, facial/tongue/throat swelling, SOB or lightheadedness with hypotension: unknown Has patient had a PCN reaction causing severe rash involving mucus membranes or skin necrosis: unknown Has patient had a PCN reaction that required hospitalization: no   Has patient had a PCN reaction occurring within the last 10 years: no If all of the above answers are "NO", then may proceed with Cephalosporin   Sulfa Antibiotics Rash   Tolerates non-antibiotic sulfa drugs      Medication List      acetaminophen 325 MG tablet Commonly known as: TYLENOL Take 650 mg by mouth every 6 (six) hours as needed for mild pain or headache.   BIOTIN PO Take by mouth.   CALCIUM PO Take by mouth.   ciclopirox 8 % solution Commonly known as: PENLAC Apply topically at bedtime. Apply over nail and surrounding skin. Apply daily over previous coat. Remove weekly with file or polish remover.   desonide 0.05 % ointment Commonly known as: DESOWEN   doxycycline 100 MG tablet Commonly known as: VIBRA-TABS Take 1 tablet (100 mg total) by mouth 2 (two) times daily.   fexofenadine 180 MG tablet Commonly known as:  ALLEGRA Take 180 mg by mouth daily as needed for allergies. As needed   FISH OIL PO Take by mouth.   GRAPESEED EXTRACT PO Take by mouth daily.   guaiFENesin 600 MG 12 hr tablet Commonly known as: MUCINEX Take 1,200 mg by mouth 2 (two) times daily as needed for cough or to loosen phlegm.   hydrochlorothiazide 12.5 MG tablet Commonly known as: HYDRODIURIL Take 12.5 mg by mouth daily.   ketoconazole 2 % cream Commonly known as: NIZORAL   levalbuterol 45 MCG/ACT inhaler Commonly known as: XOPENEX HFA Inhale 1-2  puffs into the lungs every 4 (four) hours as needed for wheezing.   levothyroxine 100 MCG tablet Commonly known as: SYNTHROID Take 125 mcg by mouth daily.   loratadine 10 MG tablet Commonly known as: CLARITIN Take 10 mg by mouth daily.   metroNIDAZOLE 0.75 % cream Commonly known as: METROCREAM   montelukast 10 MG tablet Commonly known as: SINGULAIR TAKE 1 TABLET BY MOUTH DAILY AT BEDTIME AS NEEDED   PRESCRIPTION MEDICATION Allergy shots 2 x per week either or Tuesday, Wednesday, or Thursday   Shingrix injection Generic drug: Zoster Vaccine Adjuvanted ADM 0.5ML IM UTD   VITAMIN C PO Take by mouth.   VITAMIN D PO Take by mouth.   VITAMIN E PO Take by mouth.   Zetia 10 MG tablet Generic drug: ezetimibe Take 10 mg by mouth daily.       Past Medical History:  Diagnosis Date   Aneurysm, ascending aorta ([place]) 2013   34mm   Anxiety    hx of  anxiety , had stress test several years ago per patient that was normal    Arthritis    thumbs , oa   Ascending aorta dilation ([facility]) 08/07/2012   Asthma    Asthma    Cancer ([place])    cancer left kidney   Cancer of kidney ([place])    Had right kidney removed   Complication of anesthesia    pulse rate slow in past, did ok with recent anesthesia   Deaf    left ear   Dysrhythmia    occasional arrythmia, no meds taken for   Hematuria    no blood seen recently   Hematuria    Hyperlipidemia    Hyperplastic colon polyp    Hypertension    Hypothyroidism    IFG (impaired fasting glucose)    Nodule of left lung    stable, surveillance  Dr Brigitte Pulse   Numbness    left thigh, when walking or standing for long periods of time   Osteopenia    RLS (restless legs syndrome)    Rosacea    Simple endometrial hyperplasia years ago   Thoracic aortic aneurysm ([place])    stable per last chest ct 07-11-14 [facility] on chart-    Vasovagal reaction    hx of     Past Surgical History:  Procedure Laterality Date     acoustic neuroma S/P resection Lt ear deafness 1990     tinnitus  from left ear   BTL     bumonectomy Bilateral    x 2   CATARACT EXTRACTION Bilateral 2004   CRANIECTOMY FOR EXCISION OF ACOUSTIC NEUROMA Left 1990   Deaf on Left side   CYSTOSCOPY WITH RETROGRADE PYELOGRAM, URETEROSCOPY AND STENT PLACEMENT Bilateral 08/07/2015   Procedure: CYSTOSCOPY WITH BILATERAL RETROGRADE PYELOGRAM, LEFT URETEROSCOPY WITH BIOPSY AND  LEFT STENT PLACEMENT;  Surgeon: Alexis Frock, MD;  Location: [facility];  Service:  Urology;  Laterality: Bilateral;   EYE SURGERY Bilateral 2013   eyelid drooping fixed   EYE SURGERY Bilateral    yag procedure after cataracts   left hand middle finger surgery   2015    cadaver bone placed    left shoulder surgery 2005     Lt knee surgery 2008 Left 2008   tears twice one surgery   lt wrist surgery 2005     mortone neuroma  2010   left foot 3rd and 4th toe   ROBOT ASSITED LAPAROSCOPIC NEPHROURETERECTOMY Left 09/19/2015   Procedure: ROBOT ASSITED LAPAROSCOPIC NEPHROURETERECTOMY WITH DIAPHRAMATIC HERNIA REPAIR;  Surgeon: Alexis Frock, MD;  Location: [facility];  Service: Urology;  Laterality: Left;   SP CHOLECYSTOMY     THYROIDECTOMY  11/2009   S/P Adenoma follicular vs hyperplastic   TONSILLECTOMY  age 69   TUBAL LIGATION  1976   WRIST SURGERY Left 2007   wrist cyst removed    Review of systems negative except as noted in HPI / PMHx or noted below:  Review of Systems  Constitutional: Negative.   HENT: Negative.   Eyes: Negative.   Respiratory: Negative.   Cardiovascular: Negative.   Gastrointestinal: Negative.   Genitourinary: Negative.   Musculoskeletal: Negative.   Skin: Negative.   Neurological: Negative.   Endo/Heme/Allergies: Negative.   Psychiatric/Behavioral: Negative.      Objective:   Vitals:   07/08/20 0853  BP: 126/82  Pulse: 67  Resp: 16  Temp: 97.6 F (36.4 C)  SpO2: 97%   Height: 5\' 3"  (160 cm)  Weight: 193 lb 3.2  oz (87.6 kg)   Physical Exam Constitutional:      Appearance: She is not diaphoretic.  HENT:     Head: Normocephalic.     Right Ear: Tympanic membrane, ear canal and external ear normal.     Left Ear: Tympanic membrane, ear canal and external ear normal.     Nose: Nose normal. No mucosal edema or rhinorrhea.     Mouth/Throat:     Pharynx: Uvula midline. No oropharyngeal exudate.  Eyes:     Conjunctiva/sclera: Conjunctivae normal.  Neck:     Thyroid: No thyromegaly.     Trachea: Trachea normal. No tracheal tenderness or tracheal deviation.  Cardiovascular:     Rate and Rhythm: Normal rate and regular rhythm.     Heart sounds: Normal heart sounds, S1 normal and S2 normal. No murmur heard.   Pulmonary:     Effort: No respiratory distress.     Breath sounds: Normal breath sounds. No stridor. No wheezing or rales.  Lymphadenopathy:     Head:     Right side of head: No tonsillar adenopathy.     Left side of head: No tonsillar adenopathy.     Cervical: No cervical adenopathy.  Skin:    Findings: Rash (Approximately 6 punctate erythematous papules involving inner thighs bilaterally.) present. No erythema.     Nails: There is no clubbing.  Neurological:     Mental Status: She is alert.     Diagnostics:    Spirometry was performed and demonstrated an FEV1 of 2.02 at 106 % of predicted.   Assessment and Plan:   1. Asthma, not well controlled, mild intermittent, with acute exacerbation   2. Perennial allergic rhinitis   3. Inflammatory dermatosis     1. Restart immunotherapy at 0.05 red vial and build up with schedule C  2. If needed:   A.  OTC antihistamine - Claritin 10  mg - 1 time per day  B.  Xopenex HFA 2 puffs every 4-6 hours  C.  Montelukast 10 mg -1 tablet 1 time per day   D.  EpiPen  3.  Depo-Medrol 80 IM delivered in clinic today  4. Return to clinic in 1 year or earlier if needed  5.  Obtain fall flu vaccine  Overall Malinda appears to have done pretty  well since her last visit but she may have a little bit of inflammation of her airway secondary to the construction dust and moving household items from 1 area to another.  We will give her a injectable steroid to address both her inflamed airway and what appears to be an inflammatory dermatosis and assume that this entire issue will resolve.  She can restart her immunotherapy as noted above.  I will see her back in his clinic in 1 year or earlier if there is a problem.  Allena Katz, MD Allergy / Immunology [place]

## 2020-07-09 ENCOUNTER — Encounter: Payer: Self-pay | Admitting: Allergy and Immunology

## 2020-07-26 DIAGNOSIS — Z23 Encounter for immunization: Secondary | ICD-10-CM | POA: Diagnosis not present

## 2020-07-29 ENCOUNTER — Other Ambulatory Visit: Payer: Self-pay

## 2020-07-29 ENCOUNTER — Ambulatory Visit (INDEPENDENT_AMBULATORY_CARE_PROVIDER_SITE_OTHER): Payer: Medicare Other

## 2020-07-29 DIAGNOSIS — E039 Hypothyroidism, unspecified: Secondary | ICD-10-CM | POA: Diagnosis not present

## 2020-07-29 DIAGNOSIS — J309 Allergic rhinitis, unspecified: Secondary | ICD-10-CM | POA: Diagnosis not present

## 2020-08-19 ENCOUNTER — Ambulatory Visit (INDEPENDENT_AMBULATORY_CARE_PROVIDER_SITE_OTHER): Payer: Medicare Other

## 2020-08-19 DIAGNOSIS — J309 Allergic rhinitis, unspecified: Secondary | ICD-10-CM

## 2020-08-28 DIAGNOSIS — Z85528 Personal history of other malignant neoplasm of kidney: Secondary | ICD-10-CM | POA: Diagnosis not present

## 2020-08-28 DIAGNOSIS — E039 Hypothyroidism, unspecified: Secondary | ICD-10-CM | POA: Diagnosis not present

## 2020-08-28 DIAGNOSIS — R7989 Other specified abnormal findings of blood chemistry: Secondary | ICD-10-CM | POA: Diagnosis not present

## 2020-08-28 DIAGNOSIS — D72819 Decreased white blood cell count, unspecified: Secondary | ICD-10-CM | POA: Diagnosis not present

## 2020-08-29 DIAGNOSIS — E039 Hypothyroidism, unspecified: Secondary | ICD-10-CM | POA: Diagnosis not present

## 2020-08-29 DIAGNOSIS — E785 Hyperlipidemia, unspecified: Secondary | ICD-10-CM | POA: Diagnosis not present

## 2020-08-29 DIAGNOSIS — D72819 Decreased white blood cell count, unspecified: Secondary | ICD-10-CM | POA: Diagnosis not present

## 2020-08-29 DIAGNOSIS — R5383 Other fatigue: Secondary | ICD-10-CM | POA: Diagnosis not present

## 2020-09-09 ENCOUNTER — Ambulatory Visit (HOSPITAL_COMMUNITY)
Admission: RE | Admit: 2020-09-09 | Discharge: 2020-09-09 | Disposition: A | Discharge status: Home / Self Care | Payer: Medicare Other | Source: Ambulatory Visit | Attending: Urology | Admitting: Urology

## 2020-09-09 ENCOUNTER — Other Ambulatory Visit (HOSPITAL_COMMUNITY): Payer: Self-pay | Admitting: Urology

## 2020-09-09 ENCOUNTER — Other Ambulatory Visit: Payer: Self-pay

## 2020-09-09 DIAGNOSIS — C652 Malignant neoplasm of left renal pelvis: Secondary | ICD-10-CM

## 2020-09-09 DIAGNOSIS — D259 Leiomyoma of uterus, unspecified: Secondary | ICD-10-CM | POA: Diagnosis not present

## 2020-09-09 DIAGNOSIS — Z85528 Personal history of other malignant neoplasm of kidney: Secondary | ICD-10-CM | POA: Diagnosis not present

## 2020-09-09 DIAGNOSIS — K429 Umbilical hernia without obstruction or gangrene: Secondary | ICD-10-CM | POA: Diagnosis not present

## 2020-09-12 DIAGNOSIS — R8281 Pyuria: Secondary | ICD-10-CM | POA: Diagnosis not present

## 2020-09-12 DIAGNOSIS — D72819 Decreased white blood cell count, unspecified: Secondary | ICD-10-CM | POA: Diagnosis not present

## 2020-09-12 DIAGNOSIS — J45909 Unspecified asthma, uncomplicated: Secondary | ICD-10-CM | POA: Diagnosis not present

## 2020-09-12 DIAGNOSIS — E039 Hypothyroidism, unspecified: Secondary | ICD-10-CM | POA: Diagnosis not present

## 2020-09-12 DIAGNOSIS — Z85528 Personal history of other malignant neoplasm of kidney: Secondary | ICD-10-CM | POA: Diagnosis not present

## 2020-09-12 DIAGNOSIS — N1831 Chronic kidney disease, stage 3a: Secondary | ICD-10-CM | POA: Diagnosis not present

## 2020-09-12 DIAGNOSIS — Z0000 Encounter for general adult medical examination without abnormal findings: Secondary | ICD-10-CM | POA: Diagnosis not present

## 2020-09-12 DIAGNOSIS — R7301 Impaired fasting glucose: Secondary | ICD-10-CM | POA: Diagnosis not present

## 2020-09-12 DIAGNOSIS — E669 Obesity, unspecified: Secondary | ICD-10-CM | POA: Diagnosis not present

## 2020-09-12 DIAGNOSIS — I712 Thoracic aortic aneurysm, without rupture: Secondary | ICD-10-CM | POA: Diagnosis not present

## 2020-09-12 DIAGNOSIS — Z1331 Encounter for screening for depression: Secondary | ICD-10-CM | POA: Diagnosis not present

## 2020-09-12 DIAGNOSIS — I10 Essential (primary) hypertension: Secondary | ICD-10-CM | POA: Diagnosis not present

## 2020-09-12 DIAGNOSIS — R5383 Other fatigue: Secondary | ICD-10-CM | POA: Diagnosis not present

## 2020-09-12 DIAGNOSIS — E785 Hyperlipidemia, unspecified: Secondary | ICD-10-CM | POA: Diagnosis not present

## 2020-09-12 DIAGNOSIS — Z1339 Encounter for screening examination for other mental health and behavioral disorders: Secondary | ICD-10-CM | POA: Diagnosis not present

## 2020-09-17 ENCOUNTER — Ambulatory Visit (INDEPENDENT_AMBULATORY_CARE_PROVIDER_SITE_OTHER): Payer: Medicare Other | Admitting: *Deleted

## 2020-09-17 DIAGNOSIS — J309 Allergic rhinitis, unspecified: Secondary | ICD-10-CM | POA: Diagnosis not present

## 2020-09-24 ENCOUNTER — Ambulatory Visit (INDEPENDENT_AMBULATORY_CARE_PROVIDER_SITE_OTHER): Payer: Medicare Other | Admitting: *Deleted

## 2020-09-24 DIAGNOSIS — J309 Allergic rhinitis, unspecified: Secondary | ICD-10-CM

## 2020-09-29 DIAGNOSIS — Z905 Acquired absence of kidney: Secondary | ICD-10-CM | POA: Diagnosis not present

## 2020-09-29 DIAGNOSIS — C652 Malignant neoplasm of left renal pelvis: Secondary | ICD-10-CM | POA: Diagnosis not present

## 2020-09-30 DIAGNOSIS — H532 Diplopia: Secondary | ICD-10-CM | POA: Diagnosis not present

## 2020-10-08 ENCOUNTER — Ambulatory Visit (INDEPENDENT_AMBULATORY_CARE_PROVIDER_SITE_OTHER): Payer: Medicare Other

## 2020-10-08 DIAGNOSIS — J309 Allergic rhinitis, unspecified: Secondary | ICD-10-CM | POA: Diagnosis not present

## 2020-10-20 NOTE — Progress Notes (Signed)
VIALS EXP 10-21-21 

## 2020-10-21 DIAGNOSIS — J301 Allergic rhinitis due to pollen: Secondary | ICD-10-CM | POA: Diagnosis not present

## 2020-10-22 ENCOUNTER — Ambulatory Visit (INDEPENDENT_AMBULATORY_CARE_PROVIDER_SITE_OTHER): Payer: Medicare Other

## 2020-10-22 DIAGNOSIS — J3089 Other allergic rhinitis: Secondary | ICD-10-CM | POA: Diagnosis not present

## 2020-10-22 DIAGNOSIS — J309 Allergic rhinitis, unspecified: Secondary | ICD-10-CM | POA: Diagnosis not present

## 2020-10-23 ENCOUNTER — Encounter: Payer: Self-pay | Admitting: Podiatry

## 2020-10-23 ENCOUNTER — Ambulatory Visit (INDEPENDENT_AMBULATORY_CARE_PROVIDER_SITE_OTHER): Payer: Medicare Other | Admitting: Podiatry

## 2020-10-23 ENCOUNTER — Other Ambulatory Visit: Payer: Self-pay

## 2020-10-23 DIAGNOSIS — B079 Viral wart, unspecified: Secondary | ICD-10-CM | POA: Diagnosis not present

## 2020-10-24 NOTE — Progress Notes (Signed)
Subjective:   Patient ID: Ermalinda Barrios, female   DOB: 80 y.o.   MRN: [id number]   HPI Patient presents stating she has developed lesions on the plantar aspect left which has become painful again and it did very well the last time we treated it   ROS      Objective:  Physical Exam  Neurovascular status intact with significant keratotic lesion plantar left that upon debridement shows pinpoint bleeding pain to lateral pressure     Assessment:  Reoccurrence most likely verruca plantaris left versus callus formation     Plan:  H&P reviewed condition and sharp sterile debridement technique accomplished and I then went ahead and I applied chemical agent to create immune response with sterile dressing and explained what to do if any blistering were to occur.  Patient will be seen back to recheck again in the next 4 to 6 weeks for earlier if any pathology were to occur

## 2020-10-28 DIAGNOSIS — E785 Hyperlipidemia, unspecified: Secondary | ICD-10-CM | POA: Diagnosis not present

## 2020-10-28 DIAGNOSIS — G2581 Restless legs syndrome: Secondary | ICD-10-CM | POA: Diagnosis not present

## 2020-10-28 DIAGNOSIS — E041 Nontoxic single thyroid nodule: Secondary | ICD-10-CM | POA: Diagnosis not present

## 2020-10-28 DIAGNOSIS — R509 Fever, unspecified: Secondary | ICD-10-CM | POA: Diagnosis not present

## 2020-10-28 DIAGNOSIS — E890 Postprocedural hypothyroidism: Secondary | ICD-10-CM | POA: Diagnosis not present

## 2020-10-28 DIAGNOSIS — L719 Rosacea, unspecified: Secondary | ICD-10-CM | POA: Diagnosis not present

## 2020-11-05 ENCOUNTER — Ambulatory Visit (INDEPENDENT_AMBULATORY_CARE_PROVIDER_SITE_OTHER): Payer: Medicare Other

## 2020-11-05 DIAGNOSIS — J309 Allergic rhinitis, unspecified: Secondary | ICD-10-CM

## 2020-11-06 DIAGNOSIS — M25561 Pain in right knee: Secondary | ICD-10-CM | POA: Diagnosis not present

## 2020-11-13 DIAGNOSIS — M25561 Pain in right knee: Secondary | ICD-10-CM | POA: Diagnosis not present

## 2020-11-21 DIAGNOSIS — M25561 Pain in right knee: Secondary | ICD-10-CM | POA: Diagnosis not present

## 2020-11-25 DIAGNOSIS — L718 Other rosacea: Secondary | ICD-10-CM | POA: Diagnosis not present

## 2020-11-25 DIAGNOSIS — L814 Other melanin hyperpigmentation: Secondary | ICD-10-CM | POA: Diagnosis not present

## 2020-11-25 DIAGNOSIS — D485 Neoplasm of uncertain behavior of skin: Secondary | ICD-10-CM | POA: Diagnosis not present

## 2020-11-25 DIAGNOSIS — L821 Other seborrheic keratosis: Secondary | ICD-10-CM | POA: Diagnosis not present

## 2020-11-25 DIAGNOSIS — D225 Melanocytic nevi of trunk: Secondary | ICD-10-CM | POA: Diagnosis not present

## 2020-11-28 DIAGNOSIS — M25561 Pain in right knee: Secondary | ICD-10-CM | POA: Diagnosis not present

## 2020-12-02 ENCOUNTER — Ambulatory Visit (INDEPENDENT_AMBULATORY_CARE_PROVIDER_SITE_OTHER): Payer: Medicare Other | Admitting: *Deleted

## 2020-12-02 DIAGNOSIS — J309 Allergic rhinitis, unspecified: Secondary | ICD-10-CM

## 2020-12-02 DIAGNOSIS — R102 Pelvic and perineal pain: Secondary | ICD-10-CM | POA: Diagnosis not present

## 2020-12-26 DIAGNOSIS — Z85528 Personal history of other malignant neoplasm of kidney: Secondary | ICD-10-CM | POA: Diagnosis not present

## 2020-12-26 DIAGNOSIS — R1031 Right lower quadrant pain: Secondary | ICD-10-CM | POA: Diagnosis not present

## 2020-12-26 DIAGNOSIS — K5909 Other constipation: Secondary | ICD-10-CM | POA: Diagnosis not present

## 2020-12-26 DIAGNOSIS — R109 Unspecified abdominal pain: Secondary | ICD-10-CM | POA: Diagnosis not present

## 2020-12-29 DIAGNOSIS — H532 Diplopia: Secondary | ICD-10-CM | POA: Diagnosis not present

## 2021-01-21 ENCOUNTER — Telehealth: Payer: Self-pay

## 2021-01-21 NOTE — Telephone Encounter (Signed)
Pt called in and said due to having a new puppy she is unable to keep coming in for her allergy shots so she wants to hold off on the shots until she can come in for her yearly appt with Dr Neldon Mc. Pt is coming by to sign paper to discontinue and I have pulled her vials

## 2021-02-04 ENCOUNTER — Other Ambulatory Visit: Payer: Self-pay

## 2021-02-04 ENCOUNTER — Encounter: Payer: Self-pay | Admitting: Podiatry

## 2021-02-04 ENCOUNTER — Ambulatory Visit (INDEPENDENT_AMBULATORY_CARE_PROVIDER_SITE_OTHER): Payer: Medicare Other | Admitting: Podiatry

## 2021-02-04 DIAGNOSIS — B079 Viral wart, unspecified: Secondary | ICD-10-CM | POA: Diagnosis not present

## 2021-02-05 NOTE — Progress Notes (Signed)
Subjective:   Patient ID: Emily Maxwell, female   DOB: 80 y.o.   MRN: [id number]   HPI Patient states she has several lesions plantar aspect left that has been painful and that she responded well when we use medication over 3 months ago.  States it is just started back and her daughter is very worried about warts   ROS      Objective:  Physical Exam  Neurovascular status intact with several keratotic lesions plantar aspect left localized that upon debridement show pinpoint bleeding they are painful and there is slight lucency also     Assessment:  Probability for verruca versus porokeratosis other types of lesions which can occur plantarly     Plan:  H&P reviewed conditions and at this point I have recommended continued conservative treatment and I went ahead debrided these lesions well flush the area and apply chemical agent to create immune response with sterile dressings and explained what to do if any blistering were to occur.  Reappoint as needed

## 2021-02-17 ENCOUNTER — Ambulatory Visit
Admission: RE | Admit: 2021-02-17 | Discharge: 2021-02-17 | Disposition: A | Discharge status: Home / Self Care | Payer: Medicare Other | Source: Ambulatory Visit | Attending: Internal Medicine | Admitting: Internal Medicine

## 2021-02-17 ENCOUNTER — Other Ambulatory Visit: Payer: Self-pay

## 2021-02-17 ENCOUNTER — Other Ambulatory Visit: Payer: Self-pay | Admitting: Internal Medicine

## 2021-02-17 DIAGNOSIS — R1031 Right lower quadrant pain: Secondary | ICD-10-CM

## 2021-02-17 DIAGNOSIS — Z8616 Personal history of COVID-19: Secondary | ICD-10-CM | POA: Diagnosis not present

## 2021-02-17 DIAGNOSIS — R197 Diarrhea, unspecified: Secondary | ICD-10-CM | POA: Diagnosis not present

## 2021-02-17 DIAGNOSIS — E039 Hypothyroidism, unspecified: Secondary | ICD-10-CM | POA: Diagnosis not present

## 2021-02-17 DIAGNOSIS — E871 Hypo-osmolality and hyponatremia: Secondary | ICD-10-CM | POA: Diagnosis not present

## 2021-02-17 MED ORDER — IOPAMIDOL (ISOVUE-300) INJECTION 61%
100.0000 mL | Freq: Once | INTRAVENOUS | Status: AC | PRN
  Administered 2021-02-17: 100 mL via INTRAVENOUS

## 2021-02-20 DIAGNOSIS — E785 Hyperlipidemia, unspecified: Secondary | ICD-10-CM | POA: Diagnosis not present

## 2021-03-06 DIAGNOSIS — Z23 Encounter for immunization: Secondary | ICD-10-CM | POA: Diagnosis not present

## 2021-03-10 DIAGNOSIS — Z6833 Body mass index (BMI) 33.0-33.9, adult: Secondary | ICD-10-CM | POA: Diagnosis not present

## 2021-03-10 DIAGNOSIS — Z1231 Encounter for screening mammogram for malignant neoplasm of breast: Secondary | ICD-10-CM | POA: Diagnosis not present

## 2021-03-10 DIAGNOSIS — Z7689 Persons encountering health services in other specified circumstances: Secondary | ICD-10-CM | POA: Diagnosis not present

## 2021-03-10 DIAGNOSIS — Z124 Encounter for screening for malignant neoplasm of cervix: Secondary | ICD-10-CM | POA: Diagnosis not present

## 2021-04-02 ENCOUNTER — Other Ambulatory Visit: Payer: Self-pay

## 2021-04-02 ENCOUNTER — Ambulatory Visit (INDEPENDENT_AMBULATORY_CARE_PROVIDER_SITE_OTHER): Payer: Medicare Other | Admitting: Otolaryngology

## 2021-04-02 VITALS — Temp 97.3°F

## 2021-04-02 DIAGNOSIS — H90A21 Sensorineural hearing loss, unilateral, right ear, with restricted hearing on the contralateral side: Secondary | ICD-10-CM

## 2021-04-02 NOTE — Telephone Encounter (Signed)
Patient came in today 04/02/2021 and signed discontinuing immunotherapy form. Form has been faxed to [facility] in [place].

## 2021-04-02 NOTE — Progress Notes (Signed)
HPI: Emily Maxwell is a 80 y.o. female who presents for evaluation of of her right ear.  She is deaf in her left ear status post removal of acoustic neuroma in [place] over 30 years ago.  She has dependent on hearing in the right ear.  She had previously tried a cross aid without satisfied results.  She has noticed some decline in hearing in the right ear and wanted checked and cleaned.  Her last hearing test was over 4 years ago.  Past Medical History:  Diagnosis Date   Aneurysm, ascending aorta ([place]) 2013   66mm   Anxiety    hx of  anxiety , had stress test several years ago per patient that was normal    Arthritis    thumbs , oa   Ascending aorta dilation ([facility]) 08/07/2012   Asthma    Asthma    Cancer ([place])    cancer left kidney   Cancer of kidney ([place])    Had right kidney removed   Complication of anesthesia    pulse rate slow in past, did ok with recent anesthesia   Deaf    left ear   Dysrhythmia    occasional arrythmia, no meds taken for   Hematuria    no blood seen recently   Hematuria    Hyperlipidemia    Hyperplastic colon polyp    Hypertension    Hypothyroidism    IFG (impaired fasting glucose)    Nodule of left lung    stable, surveillance  Dr Brigitte Pulse   Numbness    left thigh, when walking or standing for long periods of time   Osteopenia    RLS (restless legs syndrome)    Rosacea    Simple endometrial hyperplasia years ago   Thoracic aortic aneurysm ([place])    stable per last chest ct 07-11-14 [facility] on chart-    Vasovagal reaction    hx of    Past Surgical History:  Procedure Laterality Date   acoustic neuroma S/P resection Lt ear deafness 1990     tinnitus  from left ear   BTL     bumonectomy Bilateral    x 2   CATARACT EXTRACTION Bilateral 2004   CRANIECTOMY FOR EXCISION OF ACOUSTIC NEUROMA Left 1990   Deaf on Left side   CYSTOSCOPY WITH RETROGRADE PYELOGRAM, URETEROSCOPY AND STENT PLACEMENT Bilateral 08/07/2015   Procedure: CYSTOSCOPY WITH  BILATERAL RETROGRADE PYELOGRAM, LEFT URETEROSCOPY WITH BIOPSY AND  LEFT STENT PLACEMENT;  Surgeon: Alexis Frock, MD;  [facility];  Service: Urology;  Laterality: Bilateral;   EYE SURGERY Bilateral 2013   eyelid drooping fixed   EYE SURGERY Bilateral    yag procedure after cataracts   left hand middle finger surgery   2015    cadaver bone placed    left shoulder surgery 2005     Lt knee surgery 2008 Left 2008   tears twice one surgery   lt wrist surgery 2005     mortone neuroma  2010   left foot 3rd and 4th toe   ROBOT ASSITED LAPAROSCOPIC NEPHROURETERECTOMY Left 09/19/2015   Procedure: ROBOT ASSITED LAPAROSCOPIC NEPHROURETERECTOMY WITH DIAPHRAMATIC HERNIA REPAIR;  Surgeon: Alexis Frock, MD;  Location[facility];  Service: Urology;  Laterality: Left;   SP CHOLECYSTOMY     THYROIDECTOMY  11/2009   S/P Adenoma follicular vs hyperplastic   TONSILLECTOMY  age 55   TUBAL LIGATION  1976   WRIST SURGERY Left 2007   wrist cyst  removed   Social History   Socioeconomic History   Marital status: Married    Spouse name: Not on file   Number of children: 4   Years of education: Not on file   Highest education level: Not on file  Occupational History   Occupation: Engineer, maintenance (IT)   Occupation: controller part time  Tobacco Use   Smoking status: Never   Smokeless tobacco: Never  Substance and Sexual Activity   Alcohol use: Yes    Comment: occasional glass of wine    Drug use: No   Sexual activity: Not on file  Other Topics Concern   Not on file  Social History Narrative   Not on file   Social Determinants of Health   Financial Resource Strain: Not on file  Food Insecurity: Not on file  Transportation Needs: Not on file  Physical Activity: Not on file  Stress: Not on file  Social Connections: Not on file   Family History  Problem Relation Age of Onset   Cancer Father    Cancer Maternal Grandmother    Heart disease Maternal Grandfather    Stroke Maternal Grandfather     Hypertension Mother    Allergies  Allergen Reactions   Avelox [Moxifloxacin Hcl In Nacl] Other (See Comments)    Unable to describe reaction   Epinephrine Other (See Comments)    Makes heart rate increase and cold sweats    Tramadol Other (See Comments)    Unable to describe reaction   Penicillins Rash    Childhood rxn from weekly PCN shots w/ asthma Tx Has patient had a PCN reaction causing immediate rash, facial/tongue/throat swelling, SOB or lightheadedness with hypotension: unknown Has patient had a PCN reaction causing severe rash involving mucus membranes or skin necrosis: unknown Has patient had a PCN reaction that required hospitalization: no   Has patient had a PCN reaction occurring within the last 10 years: no If all of the above answers are "NO", then may proceed with Cephalosporin   Sulfa Antibiotics Rash    Tolerates non-antibiotic sulfa drugs   Prior to Admission medications   Medication Sig Start Date End Date Taking? Authorizing Provider  acetaminophen (TYLENOL) 325 MG tablet Take 650 mg by mouth every 6 (six) hours as needed for mild pain or headache.     [provider]  Ascorbic Acid (VITAMIN C PO) Take by mouth.    [provider]  BIOTIN PO Take by mouth.    [provider]  CALCIUM PO Take by mouth.    [provider]  Cholecalciferol (VITAMIN D PO) Take by mouth.    [provider]  ciclopirox (PENLAC) 8 % solution Apply topically at bedtime. Apply over nail and surrounding skin. Apply daily over previous coat. Remove weekly with file or polish remover. 03/02/18   Evelina Bucy, DPM  desonide (DESOWEN) 0.05 % ointment  12/23/17   [provider]  doxycycline (VIBRA-TABS) 100 MG tablet Take 1 tablet (100 mg total) by mouth 2 (two) times daily. 02/10/18   Evelina Bucy, DPM  EPINEPHrine 0.3 mg/0.3 mL IJ SOAJ injection Inject 0.3 mg into the muscle as needed for anaphylaxis. As needed for life-threatening  allergic reactions 07/08/20   Kozlow, Donnamarie Poag, MD  fexofenadine (ALLEGRA) 180 MG tablet Take 180 mg by mouth daily as needed for allergies. As needed    [provider]  guaiFENesin (MUCINEX) 600 MG 12 hr tablet Take 1,200 mg by mouth 2 (two) times daily as  needed for cough or to loosen phlegm.     [provider]  hydrochlorothiazide (HYDRODIURIL) 12.5 MG tablet Take 12.5 mg by mouth daily. 08/11/19   [provider]  ketoconazole (NIZORAL) 2 % cream  02/27/18   [provider]  levalbuterol Penne Lash HFA) 45 MCG/ACT inhaler Inhale 1-2 puffs into the lungs every 4 (four) hours as needed for wheezing. 07/08/20   Kozlow, Donnamarie Poag, MD  levothyroxine (SYNTHROID, LEVOTHROID) 100 MCG tablet Take 125 mcg by mouth daily.     [provider]  loratadine (CLARITIN) 10 MG tablet Take 10 mg by mouth daily.     [provider]  metroNIDAZOLE (METROCREAM) 0.75 % cream  02/10/18   [provider]  montelukast (SINGULAIR) 10 MG tablet TAKE 1 TABLET BY MOUTH DAILY AT BEDTIME AS NEEDED 07/08/20   Kozlow, Donnamarie Poag, MD  Nutritional Supplements (GRAPESEED EXTRACT PO) Take by mouth daily.    [provider]  Omega-3 Fatty Acids (FISH OIL PO) Take by mouth.    [provider]  PRESCRIPTION MEDICATION Allergy shots 2 x per week either or Tuesday, Wednesday, or Thursday    [provider]  [facility] injection ADM 0.5ML IM UTD 12/04/17   [provider]  VITAMIN E PO Take by mouth.    [provider]  ZETIA 10 MG tablet Take 10 mg by mouth daily.  07/06/13   [provider]     Positive ROS: Otherwise negative  All other systems have been reviewed and were otherwise negative with the exception of those mentioned in the HPI and as above.  Physical Exam: Constitutional: Alert, well-appearing, no acute distress Ears: External ears without lesions or tenderness. Ear canals with minimal wax buildup that was cleaned.   There was no obstructing wax in the right ear canal.  Minimal wax on the left side.  TMs are clear bilaterally.  On tuning fork testing she had preps minimal hearing in the left ear with low-frequency.  On tuning fork testing in the right ear she has a mild right ear sensorineural hearing loss with a 1024 tuning fork. Nasal: External nose without lesions. Septum midline. Clear nasal passages Oral: Lips and gums without lesions. Tongue and palate mucosa without lesions. Posterior oropharynx clear. Neck: No palpable adenopathy or masses Respiratory: Breathing comfortably  Skin: No facial/neck lesions or rash noted.  Procedures  Assessment: Left ear severe hearing loss status post removal of acoustic neuroma over 30 years ago. Mild right ear sensorineural hearing loss consistent with probable presbycusis.  Plan: Would recommend obtaining a hearing test and possible use of hearing aid in the right ear if she is having difficulty with her hearing.  I discussed with her that she has no significant wax obstructing her hearing.  Radene Journey, MD

## 2021-04-08 DIAGNOSIS — H9312 Tinnitus, left ear: Secondary | ICD-10-CM | POA: Diagnosis not present

## 2021-04-08 DIAGNOSIS — H90A21 Sensorineural hearing loss, unilateral, right ear, with restricted hearing on the contralateral side: Secondary | ICD-10-CM | POA: Diagnosis not present

## 2021-04-29 ENCOUNTER — Encounter: Payer: Self-pay | Admitting: Gastroenterology

## 2021-04-30 DIAGNOSIS — M170 Bilateral primary osteoarthritis of knee: Secondary | ICD-10-CM | POA: Diagnosis not present

## 2021-05-21 DIAGNOSIS — M5416 Radiculopathy, lumbar region: Secondary | ICD-10-CM | POA: Diagnosis not present

## 2021-05-22 DIAGNOSIS — K432 Incisional hernia without obstruction or gangrene: Secondary | ICD-10-CM | POA: Diagnosis not present

## 2021-05-26 DIAGNOSIS — M5416 Radiculopathy, lumbar region: Secondary | ICD-10-CM | POA: Diagnosis not present

## 2021-06-04 DIAGNOSIS — M170 Bilateral primary osteoarthritis of knee: Secondary | ICD-10-CM | POA: Diagnosis not present

## 2021-06-11 DIAGNOSIS — M170 Bilateral primary osteoarthritis of knee: Secondary | ICD-10-CM | POA: Diagnosis not present

## 2021-06-18 DIAGNOSIS — M179 Osteoarthritis of knee, unspecified: Secondary | ICD-10-CM | POA: Diagnosis not present

## 2021-06-18 DIAGNOSIS — M170 Bilateral primary osteoarthritis of knee: Secondary | ICD-10-CM | POA: Diagnosis not present

## 2021-06-18 DIAGNOSIS — M5416 Radiculopathy, lumbar region: Secondary | ICD-10-CM | POA: Diagnosis not present

## 2021-07-02 ENCOUNTER — Encounter: Payer: Self-pay | Admitting: Gastroenterology

## 2021-07-02 ENCOUNTER — Other Ambulatory Visit (INDEPENDENT_AMBULATORY_CARE_PROVIDER_SITE_OTHER): Payer: Medicare Other

## 2021-07-02 ENCOUNTER — Ambulatory Visit (INDEPENDENT_AMBULATORY_CARE_PROVIDER_SITE_OTHER): Payer: Medicare Other | Admitting: Gastroenterology

## 2021-07-02 VITALS — BP 120/80 | HR 80 | Ht 63.0 in | Wt 184.0 lb

## 2021-07-02 DIAGNOSIS — K582 Mixed irritable bowel syndrome: Secondary | ICD-10-CM

## 2021-07-02 DIAGNOSIS — R197 Diarrhea, unspecified: Secondary | ICD-10-CM

## 2021-07-02 DIAGNOSIS — R159 Full incontinence of feces: Secondary | ICD-10-CM

## 2021-07-02 LAB — BASIC METABOLIC PANEL
BUN: 19 mg/dL (ref 6–23)
CO2: 29 mEq/L (ref 19–32)
Calcium: 10 mg/dL (ref 8.4–10.5)
Chloride: 100 mEq/L (ref 96–112)
Creatinine, Ser: 1.07 mg/dL (ref 0.40–1.20)
GFR: 49.27 mL/min — ABNORMAL LOW (ref >60.00)
Glucose, Bld: 100 mg/dL — ABNORMAL HIGH (ref 70–99)
Potassium: 4 mEq/L (ref 3.5–5.1)
Sodium: 137 mEq/L (ref 135–145)

## 2021-07-02 LAB — MAGNESIUM: Magnesium: 2 mg/dL (ref 1.5–2.5)

## 2021-07-02 NOTE — Patient Instructions (Addendum)
We will refer you to Physical Therapy, they will contact you with that appointment   Your provider has requested that you go to the basement level for lab work before leaving today. Press "B" on the elevator. The lab is located at the first door on the left as you exit the elevator.   Take Benefiber 1/2 to 1 packet twice daily with meals  Follow up in 6 months    If you are age 80 or older, your body mass index should be between 23-30. Your Body mass index is 32.59 kg/m. If this is out of the aforementioned range listed, please consider follow up with your Primary Care Provider.  If you are age 56 or younger, your body mass index should be between 19-25. Your Body mass index is 32.59 kg/m. If this is out of the aformentioned range listed, please consider follow up with your Primary Care Provider.   __________________________________________________________  The [facility] providers would like to encourage you to use [facility] to communicate with providers for non-urgent requests or questions.  Due to long hold times on the telephone, sending your provider a message by [facility] may be a faster and more efficient way to get a response.  Please allow 48 business hours for a response.  Please remember that this is for non-urgent requests.    I appreciate the  opportunity to care for you  Thank You   Harl Bowie , MD

## 2021-07-02 NOTE — Progress Notes (Signed)
Emily Maxwell    [id number]    1940-11-01  Primary Care Physician:Shaw, Emily Filbert., MD  Referring Physician: Ginger Organ., MD [address] [place],  [place] [address]   Chief complaint: Change in bowel habits  HPI: 80 year old very pleasant female here for new patient visit with complaints of change in bowel habits in the past 3 months.  She is having alternating constipation and diarrhea with fecal incontinence.  Denies any blood or mucus in stool  On average usually has 1 bowel movement daily, she skips having a bowel movement on some days every few weeks followed by multiple bowel movements   Last colonoscopy was 3 years ago, normal per patient.  Report is not available to review during this visit  She uses magnesium and herbal supplements to regulate her bowel habits.  She sees a Presenter, broadcasting and has routine labs and monitoring Outpatient Encounter Medications as of 07/02/2021  Medication Sig   acetaminophen (TYLENOL) 325 MG tablet Take 650 mg by mouth every 6 (six) hours as needed for mild pain or headache.    Ascorbic Acid (VITAMIN C PO) Take by mouth.   BIOTIN PO Take by mouth.   CALCIUM PO Take by mouth.   Cholecalciferol (VITAMIN D PO) Take by mouth.   ciclopirox (PENLAC) 8 % solution Apply topically at bedtime. Apply over nail and surrounding skin. Apply daily over previous coat. Remove weekly with file or polish remover.   desonide (DESOWEN) 0.05 % ointment    doxycycline (VIBRA-TABS) 100 MG tablet Take 1 tablet (100 mg total) by mouth 2 (two) times daily.   EPINEPHrine 0.3 mg/0.3 mL IJ SOAJ injection Inject 0.3 mg into the muscle as needed for anaphylaxis. As needed for life-threatening allergic reactions   fexofenadine (ALLEGRA) 180 MG tablet Take 180 mg by mouth daily as needed for allergies. As needed   guaiFENesin (MUCINEX) 600 MG 12 hr tablet Take 1,200 mg by mouth 2 (two) times daily as needed for cough or to loosen phlegm.     hydrochlorothiazide (HYDRODIURIL) 12.5 MG tablet Take 12.5 mg by mouth daily.   ketoconazole (NIZORAL) 2 % cream    levalbuterol (XOPENEX HFA) 45 MCG/ACT inhaler Inhale 1-2 puffs into the lungs every 4 (four) hours as needed for wheezing.   levothyroxine (SYNTHROID, LEVOTHROID) 100 MCG tablet Take 125 mcg by mouth daily.    loratadine (CLARITIN) 10 MG tablet Take 10 mg by mouth daily.   metroNIDAZOLE (METROCREAM) 0.75 % cream    montelukast (SINGULAIR) 10 MG tablet TAKE 1 TABLET BY MOUTH DAILY AT BEDTIME AS NEEDED   Nutritional Supplements (GRAPESEED EXTRACT PO) Take by mouth daily.   Omega-3 Fatty Acids (FISH OIL PO) Take by mouth.   PRESCRIPTION MEDICATION Allergy shots 2 x per week either or Tuesday, Wednesday, or Thursday   SHINGRIX injection ADM 0.5ML IM UTD   VITAMIN E PO Take by mouth.   ZETIA 10 MG tablet Take 10 mg by mouth daily.    No facility-administered encounter medications on file as of 07/02/2021.    Allergies as of 07/02/2021 - Review Complete 04/02/2021  Allergen Reaction Noted   Avelox [moxifloxacin hcl in nacl] Other (See Comments) 07/10/2013   Epinephrine Other (See Comments) 08/04/2015   Tramadol Other (See Comments) 07/10/2013   Penicillins Rash 08/07/2012   Sulfa antibiotics Rash 09/15/2015    Past Medical History:  Diagnosis Date   Aneurysm, ascending aorta ([facility]) 2013  63m   Anxiety    hx of  anxiety , had stress test several years ago per patient that was normal    Arthritis    thumbs , oa   Ascending aorta dilation ([facility]) 08/07/2012   Asthma    Asthma    [place]    cancer [place]   [place]    Had right [place] removed   Complication of anesthesia    pulse rate slow in past, did ok with recent anesthesia   Deaf    left ear   Dysrhythmia    occasional arrythmia, no meds taken for   Hematuria    no blood seen recently   Hematuria    Hyperlipidemia    Hyperplastic colon polyp    Hypertension    Hypothyroidism    IFG  (impaired fasting glucose)    Nodule of left lung    stable, surveillance  Dr SBrigitte Pulse  Numbness    left thigh, when walking or standing for long periods of time   Osteopenia    RLS (restless legs syndrome)    Rosacea    Simple endometrial hyperplasia years ago   [place]    stable per last chest ct 07-11-14 [facility] on chart-    Vasovagal reaction    hx of     Past Surgical History:  Procedure Laterality Date   acoustic neuroma S/P resection Lt ear deafness 1990     tinnitus  from left ear   BTL     bumonectomy Bilateral    x 2   CATARACT EXTRACTION Bilateral 2004   CRANIECTOMY FOR EXCISION OF ACOUSTIC NEUROMA Left 1990   Deaf on Left side   CYSTOSCOPY WITH RETROGRADE PYELOGRAM, URETEROSCOPY AND STENT PLACEMENT Bilateral 08/07/2015   Procedure: CYSTOSCOPY WITH BILATERAL RETROGRADE PYELOGRAM, LEFT URETEROSCOPY WITH BIOPSY AND  LEFT STENT PLACEMENT;  Surgeon: TAlexis Frock MD;  Location: [facility];  Service: Urology;  Laterality: Bilateral;   EYE SURGERY Bilateral 2013   eyelid drooping fixed   EYE SURGERY Bilateral    yag procedure after cataracts   left hand middle finger surgery   2015    cadaver bone placed    left shoulder surgery 2005     Lt knee surgery 2008 Left 2008   tears twice one surgery   lt wrist surgery 2005     mortone neuroma  2010   left foot 3rd and 4th toe   ROBOT ASSITED LAPAROSCOPIC NEPHROURETERECTOMY Left 09/19/2015   Procedure: ROBOT ASSITED LAPAROSCOPIC NEPHROURETERECTOMY WITH DIAPHRAMATIC HERNIA REPAIR;  Surgeon: TAlexis Frock MD;  Location: [facility];  Service: Urology;  Laterality: Left;   SP CHOLECYSTOMY     THYROIDECTOMY  11/2009   S/P Adenoma follicular vs hyperplastic   TONSILLECTOMY  age 80  TUBAL LIGATION  1976   WRIST SURGERY Left 2007   wrist cyst removed    Family History  Problem Relation Age of Onset   Cancer Father    Cancer Maternal Grandmother    Heart disease Maternal Grandfather    Stroke Maternal  Grandfather    Hypertension Mother     Social History   Socioeconomic History   Marital status: Married    Spouse name: Not on file   Number of children: 4   Years of education: Not on file   Highest education level: Not on file  Occupational History   Occupation: CEngineer, maintenance (IT)  Occupation: controller part time  Tobacco  Use   Smoking status: Never   Smokeless tobacco: Never  Substance and Sexual Activity   Alcohol use: Yes    Comment: occasional glass of wine    Drug use: No   Sexual activity: Not on file  Other Topics Concern   Not on file  Social History Narrative   Not on file   Social Determinants of Health   Financial Resource Strain: Not on file  Food Insecurity: Not on file  Transportation Needs: Not on file  Physical Activity: Not on file  Stress: Not on file  Social Connections: Not on file  Intimate Partner Violence: Not on file      Review of systems: All other review of systems negative except as mentioned in the HPI.   Physical Exam: Vitals:   07/02/21 1014  BP: 120/80  Pulse: 80   Body mass index is 32.59 kg/m. Gen:      No acute distress HEENT:  sclera anicteric Abd:      soft, non-tender; no palpable masses, no distension Ext:    No edema Neuro: alert and oriented x 3 Psych: normal mood and affect  Data Reviewed:  Reviewed labs, radiology imaging, old records and pertinent past GI work up   Assessment and Plan/Recommendations:  80 year old very pleasant female with complaints of alternating constipation and diarrhea associated with fecal incontinence Referred to pelvic floor physical therapy to improve anal sphincter and pelvic floor to prevent fecal incontinence  Benefiber 1 to 2 tablespoon 3 times daily with meals to regulate bowel habits Continue bowel regimen  We will check BMP magnesium level, she does take magnesium supplements.  Advised patient to avoid taking excessive magnesium  Return in 6 months or sooner if needed    The  patient was provided an opportunity to ask questions and all were answered. The patient agreed with the plan and demonstrated an understanding of the instructions.  Damaris Hippo , MD    CC: Ginger Organ., MD

## 2021-07-06 DIAGNOSIS — Z23 Encounter for immunization: Secondary | ICD-10-CM | POA: Diagnosis not present

## 2021-07-09 ENCOUNTER — Telehealth: Payer: Self-pay | Admitting: *Deleted

## 2021-07-09 NOTE — Telephone Encounter (Signed)
Records on Dr Jillyn Hidden desk for review

## 2021-07-16 DIAGNOSIS — M5459 Other low back pain: Secondary | ICD-10-CM | POA: Diagnosis not present

## 2021-07-27 ENCOUNTER — Ambulatory Visit: Payer: Medicare Other | Attending: Gastroenterology | Admitting: Physical Therapy

## 2021-07-27 ENCOUNTER — Encounter: Payer: Self-pay | Admitting: Physical Therapy

## 2021-07-27 ENCOUNTER — Other Ambulatory Visit: Payer: Self-pay

## 2021-07-27 DIAGNOSIS — R2689 Other abnormalities of gait and mobility: Secondary | ICD-10-CM | POA: Insufficient documentation

## 2021-07-27 DIAGNOSIS — M6281 Muscle weakness (generalized): Secondary | ICD-10-CM | POA: Insufficient documentation

## 2021-07-27 DIAGNOSIS — R293 Abnormal posture: Secondary | ICD-10-CM | POA: Diagnosis not present

## 2021-07-27 NOTE — Patient Instructions (Signed)
About Constipation  Constipation Overview Constipation is the most common gastrointestinal complaint -- about 4 million Americans experience constipation and make 2.5 million physician visits a year to get help for the problem.  Constipation can occur when the colon absorbs too much water, the colon's muscle contraction is slow or sluggish, and/or there is delayed transit time through the colon.  The result is stool that is hard and dry.  Indicators of constipation include straining during bowel movements greater than 25% of the time, having fewer than three bowel movements per week, and/or the feeling of incomplete evacuation.  There are established guidelines ([place] ) for defining constipation. A person needs to have two or more of the following symptoms for at least 12 weeks (not necessarily consecutive) in the preceding 12 months: Straining in  greater than 25% of bowel movements Lumpy or hard stools in greater than 25% of bowel movements Sensation of incomplete emptying in greater than 25% of bowel movements Sensation of anorectal obstruction/blockade in greater than 25% of bowel movements Manual maneuvers to help empty greater than 25% of bowel movements (e.g., digital evacuation, support of the pelvic floor)  Less than  3 bowel movements/week Loose stools are not present, and criteria for irritable bowel syndrome are insufficient Common Causes of Constipation lack of fiber in your diet lack of physical activity medications, including iron and calcium supplements  dairy intake dehydration abuse of laxatives travel Irritable Bowel Syndrome pregnancy luteal phase of menstruation (after ovulation and before menses) colorectal problems intestinal dysfunction  Treating Constipation  There are several ways of treating constipation, including changes to diet and exercise, use of laxatives, adjustments to the pelvic floor, and scheduled toileting.  These treatments include: increasing  fiber and fluids in the diet  increasing physical activity learning muscle coordination  learning proper toileting techniques and toileting modifications  designing and sticking  to a toileting schedule A high fiber diet with plenty of fluids (up to 8 glasses of water daily) is suggested to relieve these symptoms.  Metamucil, 1 tablespoon once or twice daily can be used to keep bowels regular if needed.  Fiber Table -- Grams of Fiber in Food  For additional information on fiber content in foods, go to www.caloriecounts.com  Food Products Serving Size Grams of Fiber/serving  Breads    Whole Wheat 1 slice 4.49  White 1 slice 0.5  Rye 1 slice 6.75  Cereals    Oat Bran 1 oz. 4.06  Wheat Bran 1 oz. 10.0  All Bran  cup 6.0  Optimum 1 cup 10.0  Whole Wheat Total 1 cup 3.0  Fiber One   cup 13.0  Shredded Wheat 1oz. 2.64  Corn Flakes 1 oz. 0.45  Cheerio's 1 1/3 cup 2.0  Oatmeal 1 oz. 2.5  Rice    Brown  cup 5.27  White  cup 1.42  Spaghetti 2 oz. 2.56  Vegetables (cooked)    Broccoli  cup 2.58  Brussels sprouts  cup 2.0  Cauliflower  cup 2.6  Carrots  cup 3.2  Corn  cup 3.03  Eggplant  cup 0.96  Green peas  cup 3.36  Lettuce (raw)  cup 0.24  Baked potato w/skin  cup 2.97  Spinach  cup 2.07  Squash  cup 2.87  Tomato (raw)  cup 1.17  Zucchini  cup 1.26  Beans    Green (canned)  cup 1.89  Kidney  cup 5.48  Lima  cup 4.25  Pinto  cup 5.93  Fresh fruits  Apple (with peel) 1 medium 2.76  Apricots 1 cup 3.13  Banana  1 medium 2.19  Black/Boysenberries 1 cup 7.2  Grapefruit 1 medium 3.61  Grapes 1 cup 1.12  Nectarine 1 medium 2.2  Orange 1 medium 3.14  Pear (with peel) 1 medium 4.32  Prunes 3 3.5  Raspberries 1 cup 7.5  Strawberries 1 cup 1.67  Watermelon 1 slice 4.25

## 2021-07-27 NOTE — Therapy (Signed)
[facility] @ [place], [place], [address] Phone: [phone]   Fax:  [phone]  Physical Therapy Evaluation  Patient Details  Name: Emily Maxwell MRN: [id number] Date of Birth: December 30, 1940 Referring Provider (PT): Mauri Pole, MD   Encounter Date: 07/27/2021   PT End of Session - 07/27/21 1114     Visit Number 1    Date for PT Re-Evaluation 10/27/21    Authorization Type Medicare/BCBS    Progress Note Due on Visit 10    PT Start Time 1016    PT Stop Time 1100    PT Time Calculation (min) 44 min    Activity Tolerance Patient tolerated treatment well    Behavior During Therapy [facility] for tasks assessed/performed             Past Medical History:  Diagnosis Date   Aneurysm, ascending aorta 2013   1mm   Anxiety    hx of  anxiety , had stress test several years ago per patient that was normal    Arthritis    thumbs , oa   Ascending aorta dilation ([facility]) 08/07/2012   Asthma    Asthma    Cancer ([facility])    cancer left kidney   Cancer of kidney ([place])    Had right kidney removed   Complication of anesthesia    pulse rate slow in past, did ok with recent anesthesia   Deaf    left ear   Dysrhythmia    occasional arrythmia, no meds taken for   Hematuria    no blood seen recently   Hematuria    Hyperlipidemia    Hyperplastic colon polyp    Hypertension    Hypothyroidism    IFG (impaired fasting glucose)    Nodule of left lung    stable, surveillance  Dr Brigitte Pulse   Numbness    left thigh, when walking or standing for long periods of time   Osteopenia    RLS (restless legs syndrome)    Rosacea    Simple endometrial hyperplasia years ago   Thoracic aortic aneurysm    stable per last chest ct 07-11-14 [facility] on chart-    Vasovagal reaction    hx of     Past Surgical History:  Procedure Laterality Date   acoustic neuroma S/P resection Lt ear deafness 1990     tinnitus  from left  ear   BTL     bumonectomy Bilateral    x 2   CATARACT EXTRACTION Bilateral 2004   CRANIECTOMY [place] Left 1990   Deaf on Left side   CYSTOSCOPY WITH RETROGRADE PYELOGRAM, URETEROSCOPY AND STENT PLACEMENT Bilateral 08/07/2015   Procedure: CYSTOSCOPY WITH BILATERAL RETROGRADE PYELOGRAM, LEFT URETEROSCOPY WITH BIOPSY AND  LEFT STENT PLACEMENT;  Surgeon: Alexis Frock, MD;  [facility];  Service: Urology;  Laterality: Bilateral;   EYE SURGERY Bilateral 2013   eyelid drooping fixed   EYE SURGERY Bilateral    yag procedure after cataracts   left hand middle finger surgery   2015    cadaver bone placed    left shoulder surgery 2005     Lt knee surgery 2008 Left 2008   tears twice one surgery   lt wrist surgery 2005     mortone neuroma  2010   left foot 3rd and 4th toe   ROBOT ASSITED LAPAROSCOPIC NEPHROURETERECTOMY Left 09/19/2015   Procedure: ROBOT ASSITED LAPAROSCOPIC NEPHROURETERECTOMY WITH  DIAPHRAMATIC HERNIA REPAIR;  Surgeon: Alexis Frock, MD;  Location[facility];  Service: Urology;  Laterality: Left;   SP CHOLECYSTOMY     THYROIDECTOMY  11/2009   S/P Adenoma follicular vs hyperplastic   TONSILLECTOMY  age 67   TUBAL LIGATION  1976   WRIST SURGERY Left 2007   wrist cyst removed    There were no vitals filed for this visit.    Subjective Assessment - 07/27/21 1021     Subjective Pt reports she had been having Rt pelvic pain and went to MD 9/15 and discussed fecal seepage for the passed 8-10 years occasionally. Per pt, MD reported weakness at spincter and referred to PT. Does have inguinal hernia on Rt side and wears abdominal/pregnancy support which helps. Pt reports Bristol stool scale usually about a type 5 and progresses to type 6 sometimes.    Pertinent History Deaf in [place] ear; 4 pregnancies with vaginal birth and thinks she had an epsiotomy, acoustic neuroma S/P resection Lt ear deafness, NEPHROURETERECTOMY, THYROIDECTOMY,  CHOLECYSTOMY, Aneurysm,  ascending aorta ([facility]) 60mm, Hypertension, Cancer of kidney, Vasovagal reaction    How long can you sit comfortably? no limits    How long can you stand comfortably? no limits    How long can you walk comfortably? no limits    Patient Stated Goals to have more regular bowel movements and less leakage.    Currently in Pain? No/denies                [facility] PT Assessment - 07/27/21 0001       Assessment   Medical Diagnosis R15.9 (ICD-10-CM) - Incontinence of feces, unspecified fecal incontinence type  R19.7 (ICD-10-CM) - Diarrhea, unspecified type    Referring Provider (PT) Mauri Pole, MD    Onset Date/Surgical Date --   8-10 years ago   Prior Therapy not PFPT      Precautions   Precautions None      Restrictions   Weight Bearing Restrictions No      Balance Screen   Has the patient fallen in the past 6 months No    Has the patient had a decrease in activity level because of a fear of falling?  No    Is the patient reluctant to leave their home because of a fear of falling?  No      Home Ecologist residence    Living Arrangements Spouse/significant other;Children      Prior Function   Level of Independence Independent      Cognition   Overall Cognitive Status Within Functional Limits for tasks assessed      Sensation   Light Touch Appears Intact      Coordination   Gross Motor Movements are Fluid and Coordinated Yes    Fine Motor Movements are Fluid and Coordinated Yes      Posture/Postural Control   Posture/Postural Control Postural limitations    Postural Limitations Rounded Shoulders;Increased thoracic kyphosis;Posterior pelvic tilt      ROM / Strength   AROM / PROM / Strength AROM;Strength      AROM   Overall AROM Comments decreased thoracic and lumbar with side bending, rotation limited by 50%.      Strength   Overall Strength Comments bil hip flexion and extension 3/5; 4/5 abduction and adduction      Flexibility    Soft Tissue Assessment /Muscle Length yes   adductors and hamstrings limited by 25%  Objective measurements completed on examination: See above findings.     Pelvic Floor Special Questions - 07/27/21 0001     Prior Pelvic/Prostate Exam Yes    Are you Pregnant or attempting pregnancy? No    Prior Pregnancies Yes    Number of Pregnancies 4    Number of Vaginal Deliveries 4    Any difficulty with labor and deliveries No    Episiotomy Performed Yes   with first   Currently Sexually Active Yes    Is this Painful No   uses lubication   History of sexually transmitted disease No    Marinoff Scale no problems    Urinary Leakage No    Urinary urgency No    Urinary frequency no    Fecal incontinence Yes   seepage occasionally, doesn't wear pads and feels usually it is a few times a month however has not had anything this month.   Fluid intake reports she doesn't think she drinks enough    Caffeine beverages very little    Falling out feeling (prolapse) No    Pelvic Floor Internal Exam deferred at this time per pt                       PT Education - 07/27/21 1043     Education Details Pt educated on exam findings, female anatomy with model used, handouts for dietary management.    Person(s) Educated Patient    Methods Explanation;Demonstration;Tactile cues;Verbal cues;Handout    Comprehension Verbalized understanding;Returned demonstration              PT Short Term Goals - 07/27/21 1217       PT SHORT TERM GOAL #1   Title Pt to be I with HEP    Time 6    Period Weeks    Status New    Target Date 09/07/21      PT SHORT TERM GOAL #2   Title pt to report no more than 1 instance a month of fecal leakage for improved QOL    Time 6    Period Weeks    Status New    Target Date 09/07/21      PT SHORT TERM GOAL #3   Title pt to demonstrate at least 3/5 pelvic floor strength for decreased fecal leakage symptoms and   improved ability to prevent leakage.    Time 6    Period Weeks    Status New    Target Date 09/07/21      PT SHORT TERM GOAL #4   Title pt to recall good voiding mechanics for decreased strain with bowel movements and decreased leakage.    Time 3    Period Months    Status New    Target Date 10/27/21               PT Long Term Goals - 07/27/21 1218       PT LONG TERM GOAL #1   Title pt to be I with advanced HEP    Time 3    Period Months    Status New    Target Date 10/27/21      PT LONG TERM GOAL #2   Title pt to report no more than 1 instance of fecal leakage over course of 2 months for improved QOL    Time 3    Period Months    Status New    Target Date 10/27/21  PT LONG TERM GOAL #3   Title pt to demonstrate at least  4/5 pelvic floor strength for decreased fecal leakage symptoms and improved ability to prevent leakage.    Time 3    Period Months    Status New    Target Date 10/27/21      PT LONG TERM GOAL #4   Title pt to demonstrate ability to lift 15# with good mechanics and breathing mechanics for decrease strain on pelvic floor.    Time 3    Period Months    Status New    Target Date 10/27/21      PT LONG TERM GOAL #5   Title pt to demonstrate ability to coordinate breathing mechanics with pelvic floor and core contract/relax at least 75% of the time for decreased strain on pelvic floor with activity.    Time 3    Period Months    Status New    Target Date 10/27/21                    Plan - 07/27/21 1157     Clinical Impression Statement Pt is 80yo female presenting to clinic with 8-10year history of fecal seepage occasional. Pt reports she typical would have this a few times a month but since seeing her doctor most recently (about a month ago) she hasn't had any. Pt reports she wears a pregnancy support belly band and thinks she has had a lot of relief from this with back pain and discomfort at hernia site. Pt denied other pain,  urinary incontinece, but did report she has a history of low back pain and is getting and MRI this week for further information. Pt reports she has also had a chronic history of gut irregularity with constipation and diarrhea noted but has been improving with new MD. Pt also has had 4 vaginal births with epsiotomy done with first child. Pt found to have bil hip weakness and decreased mobility at bil hips and thoracic and lumbar spine. Pt also had fascial restrictions in abdomen in all directions and decreased rib mobility with breathing mechanics. Pt deferred internal exam this session and requested at a later date. Pt would benefit from PT to further address deficits found during evaluation.    Personal Factors and Comorbidities Age;Comorbidity 2;Time since onset of injury/illness/exacerbation    Comorbidities 4 vaginal births, chronic history of symptoms, multiple abdominal surgeries    Examination-Activity Limitations Continence    Examination-Participation Restrictions Community Activity;Interpersonal Relationship    Stability/Clinical Decision Making Stable/Uncomplicated    Clinical Decision Making Low    Rehab Potential Good    PT Frequency Biweekly    PT Duration Other (comment)   for 8 visits   PT Treatment/Interventions ADLs/Self Care Home Management;Neuromuscular re-education;Therapeutic exercise;Therapeutic activities;Functional mobility training;Patient/family education;Manual techniques;Energy conservation;Passive range of motion;Scar mobilization;Taping;Vasopneumatic Device    PT Next Visit Plan manual at abdomen, go over all handouts, voiding mechanics.    Consulted and Agree with Plan of Care Patient             Patient will benefit from skilled therapeutic intervention in order to improve the following deficits and impairments:  Decreased coordination, Decreased endurance, Increased fascial restricitons, Improper body mechanics, Postural dysfunction, Decreased strength, Decreased  mobility, Decreased scar mobility  Visit Diagnosis: Other abnormalities of gait and mobility - Plan: PT plan of care cert/re-cert  Muscle weakness (generalized) - Plan: PT plan of care cert/re-cert  Abnormal posture - Plan: PT plan of care  cert/re-cert     Problem List Patient Active Problem List   Diagnosis Date Noted   Pain of left hand 10/26/2018   Renal mass 09/19/2015   Laryngopharyngeal reflux (LPR) 06/28/2015   Other allergic rhinitis 06/28/2015   Essential hypertension, benign 07/10/2013   Pure hypercholesterolemia 07/10/2013   Restless leg syndrome 07/10/2013   Asthma, chronic 07/10/2013   Ascending aorta dilation ([place]) 08/07/2012    Stacy Gardner, PT, DPT 07/28/2211:22 PM   [facility] @ [place], [place], [address] Phone: [phone]   Fax:  [phone]  Name: Emily Maxwell MRN: [id number] Date of Birth: 1941/03/24

## 2021-07-29 DIAGNOSIS — M5459 Other low back pain: Secondary | ICD-10-CM | POA: Diagnosis not present

## 2021-07-29 DIAGNOSIS — M5451 Vertebrogenic low back pain: Secondary | ICD-10-CM | POA: Diagnosis not present

## 2021-07-31 DIAGNOSIS — M25561 Pain in right knee: Secondary | ICD-10-CM | POA: Diagnosis not present

## 2021-07-31 DIAGNOSIS — M25562 Pain in left knee: Secondary | ICD-10-CM | POA: Diagnosis not present

## 2021-08-01 DIAGNOSIS — Z23 Encounter for immunization: Secondary | ICD-10-CM | POA: Diagnosis not present

## 2021-08-06 ENCOUNTER — Encounter: Payer: Medicare Other | Admitting: Physical Therapy

## 2021-08-20 ENCOUNTER — Encounter: Payer: Self-pay | Admitting: Physical Therapy

## 2021-08-20 ENCOUNTER — Other Ambulatory Visit: Payer: Self-pay

## 2021-08-20 ENCOUNTER — Ambulatory Visit: Payer: Medicare Other | Attending: Gastroenterology | Admitting: Physical Therapy

## 2021-08-20 DIAGNOSIS — M6281 Muscle weakness (generalized): Secondary | ICD-10-CM | POA: Insufficient documentation

## 2021-08-20 DIAGNOSIS — R279 Unspecified lack of coordination: Secondary | ICD-10-CM | POA: Insufficient documentation

## 2021-08-20 DIAGNOSIS — M5416 Radiculopathy, lumbar region: Secondary | ICD-10-CM | POA: Diagnosis not present

## 2021-08-20 NOTE — Therapy (Signed)
[facility] @ [address] [place], [place], [address] Phone: [phone]   Fax:  [phone]  Physical Therapy Treatment  Patient Details  Name: Emily Maxwell MRN: [id number] Date of Birth: [birth year] Referring Provider (PT): Mauri Pole, MD   Encounter Date: 08/20/2021   PT End of Session - 08/20/21 1122     Visit Number 2    Date for PT Re-Evaluation 10/27/21    Authorization Type Medicare/BCBS    Progress Note Due on Visit 10    PT Start Time 1106   pt arrival time   PT Stop Time 1138   pt reported no additional treatment this date and deferred internal assessment   PT Time Calculation (min) 32 min    Activity Tolerance Patient tolerated treatment well    Behavior During Therapy [facility] for tasks assessed/performed             Past Medical History:  Diagnosis Date   Aneurysm, ascending aorta 2013   55mm   Anxiety    hx of  anxiety , had stress test several years ago per patient that was normal    Arthritis    thumbs , oa   Ascending aorta dilation ([place]) 08/07/2012   Asthma    Asthma    Cancer ([place])    cancer left kidney   Cancer of kidney ([place])    Had right kidney removed   Complication of anesthesia    pulse rate slow in past, did ok with recent anesthesia   Deaf    left ear   Dysrhythmia    occasional arrythmia, no meds taken for   Hematuria    no blood seen recently   Hematuria    Hyperlipidemia    Hyperplastic colon polyp    Hypertension    Hypothyroidism    IFG (impaired fasting glucose)    Nodule of left lung    stable, surveillance  Dr Brigitte Pulse   Numbness    left thigh, when walking or standing for long periods of time   Osteopenia    RLS (restless legs syndrome)    Rosacea    Simple endometrial hyperplasia years ago   Thoracic aortic aneurysm    stable per last chest ct 07-11-14 [facility] on chart-    Vasovagal reaction    hx of     Past Surgical History:  Procedure  Laterality Date   acoustic neuroma S/P resection Lt ear deafness 1990     tinnitus  from left ear   BTL     bumonectomy Bilateral    x 2   CATARACT EXTRACTION Bilateral 2004   CRANIECTOMY FOR EXCISION OF ACOUSTIC NEUROMA Left 1990   Deaf on Left side   CYSTOSCOPY WITH RETROGRADE PYELOGRAM, URETEROSCOPY AND STENT PLACEMENT Bilateral 08/07/2015   Procedure: CYSTOSCOPY WITH BILATERAL RETROGRADE PYELOGRAM, LEFT URETEROSCOPY WITH BIOPSY AND  LEFT STENT PLACEMENT;  Surgeon: Alexis Frock, MD;  Location: [facility];  Service: Urology;  Laterality: Bilateral;   EYE SURGERY Bilateral 2013   eyelid drooping fixed   EYE SURGERY Bilateral    yag procedure after cataracts   left hand middle finger surgery   2015    cadaver bone placed    left shoulder surgery 2005     Lt knee surgery 2008 Left 2008   tears twice one surgery   lt wrist surgery 2005     mortone neuroma  2010   left foot 3rd and 4th toe  ROBOT ASSITED LAPAROSCOPIC NEPHROURETERECTOMY Left 09/19/2015   Procedure: ROBOT ASSITED LAPAROSCOPIC NEPHROURETERECTOMY WITH DIAPHRAMATIC HERNIA REPAIR;  Surgeon: Alexis Frock, MD;  Location: [facility];  Service: Urology;  Laterality: Left;   SP CHOLECYSTOMY     THYROIDECTOMY  11/2009   S/P Adenoma follicular vs hyperplastic   TONSILLECTOMY  age 80   TUBAL LIGATION  1976   WRIST SURGERY Left 2007   wrist cyst removed    There were no vitals filed for this visit.   Subjective Assessment - 08/20/21 1108     Subjective Pt reports no fecal seepage, "the squatty potty has been wonderful". Stool types now usually a 5-4 and has implemented more fiber and diet modifications.    Pertinent History Deaf in [place] ear; 4 pregnancies with vaginal birth and thinks she had an epsiotomy, acoustic neuroma S/P resection Lt ear deafness, NEPHROURETERECTOMY, THYROIDECTOMY,  CHOLECYSTOMY, Aneurysm, ascending aorta ([facility]) 34mm, Hypertension, Cancer of kidney, Vasovagal reaction    How long can you sit comfortably? no limits     How long can you stand comfortably? no limits    How long can you walk comfortably? no limits    Patient Stated Goals to have more regular bowel movements and less leakage.    Currently in Pain? No/denies                               [facility] Adult PT Treatment/Exercise - 08/20/21 0001       Self-Care   Self-Care Other Self-Care Comments    Other Self-Care Comments  Pt educated on breathing mechanics at toliet to decrease strain downward at pelvic floor and prevent straining for BMs, pt also educated on pelvic tilts at toilet for improved ease of BMs, and abdominal massage for home use in evenings before bed.      Manual Therapy   Manual Therapy Soft tissue mobilization    Manual therapy comments abdominal massage with pt educated on how to complete at home, 4 completed in clinic by PT                     PT Education - 08/20/21 1114     Education Details Pt educated on pelvic tilts on toliet for improved evacuation as needed, breathing mechanics.    Person(s) Educated Patient    Methods Explanation;Demonstration;Tactile cues;Verbal cues    Comprehension Verbalized understanding;Returned demonstration              PT Short Term Goals - 07/27/21 1217       PT SHORT TERM GOAL #1   Title Pt to be I with HEP    Time 6    Period Weeks    Status New    Target Date 09/07/21      PT SHORT TERM GOAL #2   Title pt to report no more than 1 instance a month of fecal leakage for improved QOL    Time 6    Period Weeks    Status New    Target Date 09/07/21      PT SHORT TERM GOAL #3   Title pt to demonstrate at least 3/5 pelvic floor strength for decreased fecal leakage symptoms and  improved ability to prevent leakage.    Time 6    Period Weeks    Status New    Target Date 09/07/21      PT SHORT TERM GOAL #4   Title pt  to recall good voiding mechanics for decreased strain with bowel movements and decreased leakage.    Time 3    Period  Months    Status New    Target Date 10/27/21               PT Long Term Goals - 07/27/21 1218       PT LONG TERM GOAL #1   Title pt to be I with advanced HEP    Time 3    Period Months    Status New    Target Date 10/27/21      PT LONG TERM GOAL #2   Title pt to report no more than 1 instance of fecal leakage over course of 2 months for improved QOL    Time 3    Period Months    Status New    Target Date 10/27/21      PT LONG TERM GOAL #3   Title pt to demonstrate at least  4/5 pelvic floor strength for decreased fecal leakage symptoms and improved ability to prevent leakage.    Time 3    Period Months    Status New    Target Date 10/27/21      PT LONG TERM GOAL #4   Title pt to demonstrate ability to lift 15# with good mechanics and breathing mechanics for decrease strain on pelvic floor.    Time 3    Period Months    Status New    Target Date 10/27/21      PT LONG TERM GOAL #5   Title pt to demonstrate ability to coordinate breathing mechanics with pelvic floor and core contract/relax at least 75% of the time for decreased strain on pelvic floor with activity.    Time 3    Period Months    Status New    Target Date 10/27/21                   Plan - 08/20/21 1123     Clinical Impression Statement Pt presents to clinic reporting she has had complete resolution of fecal seepage since evaluation and implementation of squatty potty, fiber/diet modifications. Pt reports so smearing or leakage at all and ver ypleased with progress. Pt educated on breathing mechanics to prevent straining with BMs, pelvic tilts on toliet for ease of BMs evacuation and abdominal massage educated and completed by PT for improved peristalsis and improved formed stools and decrease constipation. Pt reports her stool type has also improved to more 5-4 types now and she reports she is trying to implement more diet changes with fiber and water intake to improve as well. Pt requests to  return for her appointment next month to see if this progress continues and if still without symptoms would like to be DC.    Personal Factors and Comorbidities Age;Comorbidity 2;Time since onset of injury/illness/exacerbation    Comorbidities 4 vaginal births, chronic history of symptoms, multiple abdominal surgeries    Examination-Activity Limitations Continence    Examination-Participation Restrictions Community Activity;Interpersonal Relationship    Stability/Clinical Decision Making Stable/Uncomplicated    Clinical Decision Making Low    Rehab Potential Good    PT Frequency Biweekly    PT Duration Other (comment)   for 8 visits   PT Treatment/Interventions ADLs/Self Care Home Management;Neuromuscular re-education;Therapeutic exercise;Therapeutic activities;Functional mobility training;Patient/family education;Manual techniques;Energy conservation;Passive range of motion;Scar mobilization;Taping;Vasopneumatic Device    PT Next Visit Plan go over all handouts, voiding mechanics.    Consulted and  Agree with Plan of Care Patient             Patient will benefit from skilled therapeutic intervention in order to improve the following deficits and impairments:  Decreased coordination, Decreased endurance, Increased fascial restricitons, Improper body mechanics, Postural dysfunction, Decreased strength, Decreased mobility, Decreased scar mobility  Visit Diagnosis: Muscle weakness (generalized)  Lack of coordination     Problem List Patient Active Problem List   Diagnosis Date Noted   Pain of left hand 10/26/2018   Renal mass 09/19/2015   Laryngopharyngeal reflux (LPR) 06/28/2015   Other allergic rhinitis 06/28/2015   Essential hypertension, benign 07/10/2013   Pure hypercholesterolemia 07/10/2013   Restless leg syndrome 07/10/2013   Asthma, chronic 07/10/2013   Ascending aorta dilation ([place]) 08/07/2012    Stacy Gardner, PT, DPT 08/20/2210:45 AM   [facility] @ [place], [place], [address] Phone: [phone]   Fax:  [phone]  Name: Derinda Bartus MRN: [id number] Date of Birth: 19-Sep-1941

## 2021-08-31 ENCOUNTER — Encounter: Payer: Medicare Other | Admitting: Physical Therapy

## 2021-09-05 DIAGNOSIS — M5416 Radiculopathy, lumbar region: Secondary | ICD-10-CM | POA: Diagnosis not present

## 2021-09-14 ENCOUNTER — Encounter: Payer: Medicare Other | Admitting: Physical Therapy

## 2021-09-17 ENCOUNTER — Ambulatory Visit (HOSPITAL_COMMUNITY)
Admission: RE | Admit: 2021-09-17 | Discharge: 2021-09-17 | Disposition: A | Discharge status: Home / Self Care | Payer: Medicare Other | Source: Ambulatory Visit | Attending: Urology | Admitting: Urology

## 2021-09-17 ENCOUNTER — Other Ambulatory Visit (HOSPITAL_COMMUNITY): Payer: Self-pay | Admitting: Urology

## 2021-09-17 DIAGNOSIS — C642 Malignant neoplasm of left kidney, except renal pelvis: Secondary | ICD-10-CM | POA: Diagnosis not present

## 2021-09-17 DIAGNOSIS — C652 Malignant neoplasm of left renal pelvis: Secondary | ICD-10-CM | POA: Insufficient documentation

## 2021-09-21 DIAGNOSIS — Z905 Acquired absence of kidney: Secondary | ICD-10-CM | POA: Diagnosis not present

## 2021-09-21 DIAGNOSIS — C652 Malignant neoplasm of left renal pelvis: Secondary | ICD-10-CM | POA: Diagnosis not present

## 2021-09-28 ENCOUNTER — Ambulatory Visit: Payer: Medicare Other | Attending: Gastroenterology | Admitting: Physical Therapy

## 2021-09-28 ENCOUNTER — Other Ambulatory Visit: Payer: Self-pay

## 2021-09-28 DIAGNOSIS — M6281 Muscle weakness (generalized): Secondary | ICD-10-CM | POA: Insufficient documentation

## 2021-09-28 DIAGNOSIS — M5416 Radiculopathy, lumbar region: Secondary | ICD-10-CM | POA: Diagnosis not present

## 2021-09-28 NOTE — Therapy (Signed)
[facility] @ [place], [place], [address] Phone: [phone]   Fax:  [phone]  Physical Therapy Treatment  Patient Details  Name: Emily Maxwell MRN: [id number] Date of Birth: Aug 14, 1941 Referring Provider (PT): Mauri Pole, MD   Encounter Date: 09/28/2021   PT End of Session - 09/28/21 1124     Visit Number 3    Date for PT Re-Evaluation 10/27/21    Authorization Type Medicare/BCBS    Progress Note Due on Visit 10    PT Start Time 1101    PT Stop Time 2025   pt denied additional needs for PT and internal assessment. Pt reported she just had a few questions for DC and did not need additional time today.   PT Time Calculation (min) 24 min    Activity Tolerance Patient tolerated treatment well    Behavior During Therapy WFL for tasks assessed/performed             Past Medical History:  Diagnosis Date   Aneurysm, ascending aorta 2013   66m   Anxiety    hx of  anxiety , had stress test several years ago per patient that was normal    Arthritis    thumbs , oa   Ascending aorta dilation ([facility]) 08/07/2012   Asthma    Asthma    Cancer ([place]    cancer left kidney   Cancer of kidney ([place]    Had right kidney removed   Complication of anesthesia    pulse rate slow in past, did ok with recent anesthesia   Deaf    left ear   Dysrhythmia    occasional arrythmia, no meds taken for   Hematuria    no blood seen recently   Hematuria    Hyperlipidemia    Hyperplastic colon polyp    Hypertension    Hypothyroidism    IFG (impaired fasting glucose)    Nodule of left lung    stable, surveillance  Dr SBrigitte Pulse  Numbness    left thigh, when walking or standing for long periods of time   Osteopenia    RLS (restless legs syndrome)    Rosacea    Simple endometrial hyperplasia years ago   Thoracic aortic aneurysm    stable per last chest ct 07-11-14 [facility] on chart-    Vasovagal reaction    hx  of     Past Surgical History:  Procedure Laterality Date   acoustic neuroma S/P resection Lt ear deafness 1990     tinnitus  from left ear   BTL     bumonectomy Bilateral    x 2   CATARACT EXTRACTION Bilateral 2004   CRANIECTOMY FOR EXCISION OF ACOUSTIC NEUROMA Left 1990   Deaf on Left side   CYSTOSCOPY WITH RETROGRADE PYELOGRAM, URETEROSCOPY AND STENT PLACEMENT Bilateral 08/07/2015   Procedure: CYSTOSCOPY WITH BILATERAL RETROGRADE PYELOGRAM, LEFT URETEROSCOPY WITH BIOPSY AND  LEFT STENT PLACEMENT;  Surgeon: TAlexis Frock MD;  [facility];  Service: Urology;  Laterality: Bilateral;   EYE SURGERY Bilateral 2013   eyelid drooping fixed   EYE SURGERY Bilateral    yag procedure after cataracts   left hand middle finger surgery   2015    cadaver bone placed    left shoulder surgery 2005     Lt knee surgery 2008 Left 2008   tears twice one surgery   lt wrist surgery 2005     mortone neuroma  2010   left foot 3rd and 4th toe   ROBOT ASSITED LAPAROSCOPIC NEPHROURETERECTOMY Left 09/19/2015   Procedure: ROBOT ASSITED LAPAROSCOPIC NEPHROURETERECTOMY WITH DIAPHRAMATIC HERNIA REPAIR;  Surgeon: Alexis Frock, MD;  Location[facility];  Service: Urology;  Laterality: Left;   SP CHOLECYSTOMY     THYROIDECTOMY  11/2009   S/P Adenoma follicular vs hyperplastic   TONSILLECTOMY  age 27   TUBAL LIGATION  1976   WRIST SURGERY Left 2007   wrist cyst removed    There were no vitals filed for this visit.   Subjective Assessment - 09/28/21 1104     Subjective Pt reports fecal seepage and pt very pleased with progress. Pt denied internal assessment with this session and denied need however requested education on pelvic floor strengthening as needed in the future Pt educated that it is perferred to do internal assessment ot understand pt's pelvic floor strength and need of exercises and to insure pt is doing them correctly and pt reported she understands this but did deny assessment.    Pertinent  History Deaf in [place] ear; 4 pregnancies with vaginal birth and thinks she had an epsiotomy, acoustic neuroma S/P resection Lt ear deafness, NEPHROURETERECTOMY, THYROIDECTOMY,  CHOLECYSTOMY, Aneurysm, ascending aorta ([facility]) 34m, Hypertension, Cancer of kidney, Vasovagal reaction    How long can you sit comfortably? no limits    How long can you stand comfortably? no limits    How long can you walk comfortably? no limits    Patient Stated Goals to have more regular bowel movements and less leakage.    Currently in Pain? No/denies                               [facility] - [facility]       Self-Care   Self-Care Other Self-Care Comments    Other Self-Care Comments  Pt educated on continued use of squatty potty, breathing mechanics and voiding mechanics, and pelvic floor strengthening exercises as needed at pt's request. Pt also educated on pelvic floor assessment internally or at least visually to assess pelvic strength or coordination however pt denied at this time.                     PT Education - 09/28/21 1123     Education Details Pt educated on continued voiding mechanics and breathing mechanics for BMs and pelvic floor strengthening as needed.    Person(s) Educated Patient    Methods Explanation;Demonstration;Tactile cues;Verbal cues    Comprehension Verbalized understanding;Returned demonstration              PT Short Term Goals - 09/28/21 1129       PT SHORT TERM GOAL #1   Title Pt to be I with HEP    Time 6    Period Weeks    Status Achieved    Target Date 09/07/21      PT SHORT TERM GOAL #2   Title pt to report no more than 1 instance a month of fecal leakage for improved QOL    Time 6    Period Weeks    Status Achieved    Target Date 09/07/21      PT SHORT TERM GOAL #3   Title pt to demonstrate at least 3/5 pelvic floor strength for decreased fecal leakage symptoms and  improved ability to prevent  leakage.    Time 6  Period Weeks    Status Unable to assess   pt denied assessment of pelvic floor strength   Target Date 09/07/21      PT SHORT TERM GOAL #4   Title pt to recall good voiding mechanics for decreased strain with bowel movements and decreased leakage.    Time 3    Period Months    Status Achieved    Target Date 10/27/21               PT Long Term Goals - 09/28/21 1129       PT LONG TERM GOAL #1   Title pt to be I with advanced HEP    Time 3    Period Months    Status Achieved    Target Date 10/27/21      PT LONG TERM GOAL #2   Title pt to report no more than 1 instance of fecal leakage over course of 2 months for improved QOL    Time 3    Period Months    Status Achieved    Target Date 10/27/21      PT LONG TERM GOAL #3   Title pt to demonstrate at least  4/5 pelvic floor strength for decreased fecal leakage symptoms and improved ability to prevent leakage.    Time 3    Period Months    Status Unable to assess   pt denied assessment of pelvic floor   Target Date 10/27/21      PT LONG TERM GOAL #4   Title pt to demonstrate ability to lift 15# with good mechanics and breathing mechanics for decrease strain on pelvic floor.    Time 3    Period Months    Status Partially Met   pt demonstrated good body mechanics without 15# during final visit. Pt denied additional needs for PT at end of session pleased with progress and results   Target Date 10/27/21      PT LONG TERM GOAL #5   Title pt to demonstrate ability to coordinate breathing mechanics with pelvic floor and core contract/relax at least 75% of the time for decreased strain on pelvic floor with activity.    Time 3    Period Months    Status Achieved    Target Date 10/27/21                   Plan - 09/28/21 1125     Clinical Impression Statement Pt presents to clinic with reports of continued complete resolution of fecal seepage, denied any additional needs for PT at this time.  Did have questions about pelvic floor strengthening in the future as needed, pt educated she can always return with new referral for additional needs, and educated that it is recommended to have internal assessment to assess PF strength and that pt is doing exercises correctly with good coordination prior to giving pelvic strengthening exercises and pt reported she understood but denied internal assessment. Pt educated on pelvic floor contractions, relaxation as needed and to continue voiding and breathing mechanics to continue progress with fecal leakage symptoms. Pt agreed, denied additional needs for PT and agreeable to DC.    Personal Factors and Comorbidities Age;Comorbidity 2;Time since onset of injury/illness/exacerbation    Comorbidities 4 vaginal births, chronic history of symptoms, multiple abdominal surgeries    Examination-Activity Limitations Continence    Examination-Participation Restrictions Community Activity;Interpersonal Relationship    Clinical Decision Making Low    Rehab Potential Good  PT Frequency Biweekly    PT Duration Other (comment)    PT Treatment/Interventions ADLs/Self Care Home Management;Neuromuscular re-education;Therapeutic exercise;Therapeutic activities;Functional mobility training;Patient/family education;Manual techniques;Energy conservation;Passive range of motion;Scar mobilization;Taping;Vasopneumatic Device    Consulted and Agree with Plan of Care Patient             Patient will benefit from skilled therapeutic intervention in order to improve the following deficits and impairments:  Decreased coordination, Decreased endurance, Increased fascial restricitons, Improper body mechanics, Postural dysfunction, Decreased strength, Decreased mobility, Decreased scar mobility  Visit Diagnosis: Muscle weakness (generalized)     Problem List Patient Active Problem List   Diagnosis Date Noted   Pain of left hand 10/26/2018   Renal mass 09/19/2015    Laryngopharyngeal reflux (LPR) 06/28/2015   Other allergic rhinitis 06/28/2015   Essential hypertension, benign 07/10/2013   Pure hypercholesterolemia 07/10/2013   Restless leg syndrome 07/10/2013   Asthma, chronic 07/10/2013   Ascending aorta dilation ([place]) 08/07/2012    PHYSICAL THERAPY DISCHARGE SUMMARY  Visits from Start of Care: 3  Current functional level related to goals / functional outcomes: Pt denies any symptoms that brought her to PT, complete resolution.    Remaining deficits: Complete resolution per pt   Education / Equipment: Voiding and breathing mechanics, pelvic mobility and abdominal massage and pelvic floor strengthening as needed.   Patient agrees to discharge. Patient goals were partially met. Pt unable to achieve pelvic floor strength STG and LTG due to deferring and denying internal assessment during sessions. However pt pleased with progress and denied needs at this time. Patient is being discharged due to being pleased with the current functional level. Thank you for the referral.    Stacy Gardner, PT, DPT 09/28/2210:33 AM   [facility] @ [place], [place], [address] Phone: [phone]   Fax:  [phone]  Name: Emily Maxwell MRN: [id number] Date of Birth: [birth year]

## 2021-10-27 DIAGNOSIS — H43393 Other vitreous opacities, bilateral: Secondary | ICD-10-CM | POA: Diagnosis not present

## 2021-10-27 DIAGNOSIS — H35342 Macular cyst, hole, or pseudohole, left eye: Secondary | ICD-10-CM | POA: Diagnosis not present

## 2021-10-27 DIAGNOSIS — H3581 Retinal edema: Secondary | ICD-10-CM | POA: Diagnosis not present

## 2021-10-27 DIAGNOSIS — H35373 Puckering of macula, bilateral: Secondary | ICD-10-CM | POA: Diagnosis not present

## 2021-11-03 DIAGNOSIS — E039 Hypothyroidism, unspecified: Secondary | ICD-10-CM | POA: Diagnosis not present

## 2021-11-03 DIAGNOSIS — M859 Disorder of bone density and structure, unspecified: Secondary | ICD-10-CM | POA: Diagnosis not present

## 2021-11-03 DIAGNOSIS — E785 Hyperlipidemia, unspecified: Secondary | ICD-10-CM | POA: Diagnosis not present

## 2021-11-03 DIAGNOSIS — I10 Essential (primary) hypertension: Secondary | ICD-10-CM | POA: Diagnosis not present

## 2021-11-03 DIAGNOSIS — R7301 Impaired fasting glucose: Secondary | ICD-10-CM | POA: Diagnosis not present

## 2021-11-03 DIAGNOSIS — M8589 Other specified disorders of bone density and structure, multiple sites: Secondary | ICD-10-CM | POA: Diagnosis not present

## 2021-11-10 DIAGNOSIS — E669 Obesity, unspecified: Secondary | ICD-10-CM | POA: Diagnosis not present

## 2021-11-10 DIAGNOSIS — Z1389 Encounter for screening for other disorder: Secondary | ICD-10-CM | POA: Diagnosis not present

## 2021-11-10 DIAGNOSIS — Z0000 Encounter for general adult medical examination without abnormal findings: Secondary | ICD-10-CM | POA: Diagnosis not present

## 2021-11-10 DIAGNOSIS — I712 Thoracic aortic aneurysm, without rupture, unspecified: Secondary | ICD-10-CM | POA: Diagnosis not present

## 2021-11-10 DIAGNOSIS — R82998 Other abnormal findings in urine: Secondary | ICD-10-CM | POA: Diagnosis not present

## 2021-11-10 DIAGNOSIS — Z85528 Personal history of other malignant neoplasm of kidney: Secondary | ICD-10-CM | POA: Diagnosis not present

## 2021-11-10 DIAGNOSIS — E039 Hypothyroidism, unspecified: Secondary | ICD-10-CM | POA: Diagnosis not present

## 2021-11-10 DIAGNOSIS — R7301 Impaired fasting glucose: Secondary | ICD-10-CM | POA: Diagnosis not present

## 2021-11-10 DIAGNOSIS — E785 Hyperlipidemia, unspecified: Secondary | ICD-10-CM | POA: Diagnosis not present

## 2021-11-10 DIAGNOSIS — M5416 Radiculopathy, lumbar region: Secondary | ICD-10-CM | POA: Diagnosis not present

## 2021-11-10 DIAGNOSIS — N1831 Chronic kidney disease, stage 3a: Secondary | ICD-10-CM | POA: Diagnosis not present

## 2021-11-10 DIAGNOSIS — M8580 Other specified disorders of bone density and structure, unspecified site: Secondary | ICD-10-CM | POA: Diagnosis not present

## 2021-11-10 DIAGNOSIS — I10 Essential (primary) hypertension: Secondary | ICD-10-CM | POA: Diagnosis not present

## 2021-11-10 DIAGNOSIS — J45909 Unspecified asthma, uncomplicated: Secondary | ICD-10-CM | POA: Diagnosis not present

## 2021-11-10 DIAGNOSIS — Z1331 Encounter for screening for depression: Secondary | ICD-10-CM | POA: Diagnosis not present

## 2021-11-12 ENCOUNTER — Other Ambulatory Visit: Payer: Self-pay | Admitting: Internal Medicine

## 2021-11-12 DIAGNOSIS — Z1339 Encounter for screening examination for other mental health and behavioral disorders: Secondary | ICD-10-CM

## 2021-11-13 DIAGNOSIS — Z23 Encounter for immunization: Secondary | ICD-10-CM | POA: Diagnosis not present

## 2021-11-25 ENCOUNTER — Ambulatory Visit
Admission: RE | Admit: 2021-11-25 | Discharge: 2021-11-25 | Disposition: A | Discharge status: Home / Self Care | Payer: Medicare Other | Source: Ambulatory Visit | Attending: Internal Medicine | Admitting: Internal Medicine

## 2021-11-25 DIAGNOSIS — Z8679 Personal history of other diseases of the circulatory system: Secondary | ICD-10-CM | POA: Diagnosis not present

## 2021-11-25 DIAGNOSIS — Z1339 Encounter for screening examination for other mental health and behavioral disorders: Secondary | ICD-10-CM

## 2021-11-25 MED ORDER — GADOBENATE DIMEGLUMINE 529 MG/ML IV SOLN
17.0000 mL | Freq: Once | INTRAVENOUS | Status: AC | PRN
  Administered 2021-11-25: 17 mL via INTRAVENOUS

## 2021-12-17 ENCOUNTER — Other Ambulatory Visit: Payer: Self-pay | Admitting: Allergy and Immunology

## 2021-12-21 DIAGNOSIS — H903 Sensorineural hearing loss, bilateral: Secondary | ICD-10-CM | POA: Diagnosis not present

## 2021-12-28 ENCOUNTER — Other Ambulatory Visit: Payer: Self-pay | Admitting: Allergy and Immunology

## 2022-01-08 DIAGNOSIS — L821 Other seborrheic keratosis: Secondary | ICD-10-CM | POA: Diagnosis not present

## 2022-01-08 DIAGNOSIS — L814 Other melanin hyperpigmentation: Secondary | ICD-10-CM | POA: Diagnosis not present

## 2022-01-08 DIAGNOSIS — L820 Inflamed seborrheic keratosis: Secondary | ICD-10-CM | POA: Diagnosis not present

## 2022-01-08 DIAGNOSIS — D225 Melanocytic nevi of trunk: Secondary | ICD-10-CM | POA: Diagnosis not present

## 2022-01-08 DIAGNOSIS — L309 Dermatitis, unspecified: Secondary | ICD-10-CM | POA: Diagnosis not present

## 2022-01-15 ENCOUNTER — Other Ambulatory Visit: Payer: Self-pay | Admitting: Allergy and Immunology

## 2022-01-28 ENCOUNTER — Telehealth: Payer: Self-pay

## 2022-01-28 ENCOUNTER — Other Ambulatory Visit: Payer: Self-pay | Admitting: *Deleted

## 2022-01-28 MED ORDER — MONTELUKAST SODIUM 10 MG PO TABS: ORAL_TABLET | ORAL | 1 refills | Status: DC

## 2022-01-28 NOTE — Telephone Encounter (Signed)
Sent in courtesy refills to help get the patient to her appointment. Called patient and advised. Patient verbalized understanding.  ?

## 2022-01-28 NOTE — Telephone Encounter (Signed)
Patient called to make a follow up visit with Dr Neldon Mc. She is scheduled for 03/09/2022. Patient was last seen 07/08/20 and is requesting a refill on singular. I informed her that it has been over a year and I would need to send back a message. ? ?Please advise ? ?[facility]  ?

## 2022-02-03 DIAGNOSIS — M5416 Radiculopathy, lumbar region: Secondary | ICD-10-CM | POA: Diagnosis not present

## 2022-02-05 ENCOUNTER — Ambulatory Visit (INDEPENDENT_AMBULATORY_CARE_PROVIDER_SITE_OTHER): Payer: Medicare Other | Admitting: Nurse Practitioner

## 2022-02-05 ENCOUNTER — Encounter: Payer: Self-pay | Admitting: Nurse Practitioner

## 2022-02-05 VITALS — BP 126/82 | HR 80 | Wt 185.0 lb

## 2022-02-05 DIAGNOSIS — R143 Flatulence: Secondary | ICD-10-CM | POA: Diagnosis not present

## 2022-02-05 DIAGNOSIS — R140 Abdominal distension (gaseous): Secondary | ICD-10-CM

## 2022-02-05 NOTE — Patient Instructions (Signed)
You have been given a testing kit to check for small intestine bacterial overgrowth (SIBO) which is completed by a company named [facility]. Make sure to return your test in the mail using the return mailing label given to you along with the kit. Your demographic and insurance information have already been sent to the company and they should be in contact with you over the next 1-2 weeks regarding this test. [facility] will collect an upfront charge of $99.74 for commercial insurance plans and $209.74 is you are paying cash. Make sure to discuss with [facility] PRIOR to having the test to see if they have gotten information from your insurance company as to how much your testing will cost out of pocket, if any. Please keep in mind that you will be getting a call from phone number [phone] or a similar number. If you do not hear from them within this time frame, please call our office at [phone] or call [facility] directly at [phone].  ? ?Continue taking Phazyme as needed ?

## 2022-02-05 NOTE — Progress Notes (Signed)
? ? ? ?Assessment  ? ?Patient Profile:  ?Emily Maxwell is a 81 y.o. female known to Dr. Silverio Decamp with a past medical history of kidney cancer status post right nephrectomy, asthma, hyperlipidemia, hypertension, thyroidectomy, hypothyroidism. thoracic aortic aneursym followed by PCP, diverticulosis, hepatic steatosis cholecystectomy.  Additional medical history as listed in [place] . ? ?Bloating / flatus for 3 months. Affecting social life to some degree.  Cannot relate symptoms to any medication or changes in diet.  Could be SIBO but food intolerance of some sort is also possible. No alarm symptoms ? ?Plan  ? ?She is interested in SIBO testing and given symptoms this seems reasonable.  ?Continue Phazyme as needed ?Recommended she keep a strict food diary looking for culprits ? ? ?History of Present Illness  ? ?Chief Complaint : gas and bloating.  ? ?Patient establish care here September 2022 for evaluation of bowel changes with alternating constipation and diarrhea with fecal incontinence.  She was taking magnesium supplements.  We recommended Benefiber, referred her to a pelvic floor physical therapist.  ? ?Physical therapist really helped a lot, as did the squatty potty. She continues to do the exercises at home as needed.  ? ?About 3 months ago she began having problems with a lot of flatulence and some bloating.  She is still taking Benefiber, also   taking Align probiotics / prebiotics.She hasn't made any dietary or medication changes to correlate with the increased gas.  She hasn't noticed any correlation with consumption of gluten nor diary.. She did cut back on diary without improvement.  She is concerned about a parasite or bacterial overgrowth. The increased gas is occasionally very foul but for most part doesn't have an odor. has tried Phazyme which helps the gas. Her stools are general soft, the first stool a type 4 on Bristol stool scale. Stools that follow ( in same sitting) are somewhat more loose ? ?Ms  Roberg is on chronic Doxycycline under direction of Opthalmology for rosacea in eyes. No other recent antibiotics.  ? ?She doesn't use artificial sweeteners.  ? ?Someone told her she had GERD a long time a go She stopped PPI several years ago and has done well. She rarely has indigestion. When she does she will take an essential oil tablet for indigestion and symptoms resolve.  ? ? ?  Latest Ref Rng & Units 09/20/2015  ?  6:10 AM 09/19/2015  ?  4:17 PM 09/16/2015  ?  2:40 PM  ?CBC  ?WBC 4.0 - 10.5 K/uL   4.1    ?Hemoglobin 12.0 - 15.0 g/dL 12.9   13.8   14.4    ?Hematocrit 36.0 - 46.0 % 37.9   40.8   42.8    ?Platelets 150 - 400 K/uL   245    ? ? ?Previous GI Evaluations  ? ?Imaging:  ? ?May 2022 CT AP ?IMPRESSION: ?1. No acute abnormality in the abdomen or pelvis.  Normal appendix. ?2. Stable postsurgical changes of prior left adrenalectomy and left ?nephrectomy with left ureterectomy. No findings of local recurrence ?or metastases in the abdomen or pelvis. ?3. Left-sided colonic diverticulosis without findings of acute ?diverticulitis. ?4. Hepatic steatosis. ?5. Small fat containing periumbilical ventral hernia. ?6. Aortic atherosclerosis. ?  ? ? ?Past Medical History:  ?Diagnosis Date  ? Aneurysm, ascending aorta ([place]) 2013  ? 54m  ? Anxiety   ? hx of  anxiety , had stress test several years ago per patient that was normal   ? Arthritis   ?  thumbs , oa  ? Ascending aorta dilation ([facility]) 08/07/2012  ? Asthma   ? Asthma   ? Cancer [facility])   ? cancer left kidney  ? Cancer of kidney ([place])   ? Had right kidney removed  ? Complication of anesthesia   ? pulse rate slow in past, did ok with recent anesthesia  ? Deaf   ? left ear  ? Dysrhythmia   ? occasional arrythmia, no meds taken for  ? Hematuria   ? no blood seen recently  ? Hematuria   ? Hyperlipidemia   ? Hyperplastic colon polyp   ? Hypertension   ? Hypothyroidism   ? IFG (impaired fasting glucose)   ? Nodule of left lung   ? stable, surveillance  Dr Brigitte Pulse  ? Numbness   ?  left thigh, when walking or standing for long periods of time  ? Osteopenia   ? RLS (restless legs syndrome)   ? Rosacea   ? Simple endometrial hyperplasia years ago  ? Thoracic aortic aneurysm ([place])   ? stable per last chest ct 07-11-14 [facility] on chart-   ? Vasovagal reaction   ? hx of   ? ? ?Past Surgical History:  ?Procedure Laterality Date  ? acoustic neuroma S/P resection Lt ear deafness 1990    ? tinnitus  from left ear  ? BTL    ? bumonectomy Bilateral   ? x 2  ? CATARACT EXTRACTION Bilateral 2004  ? CRANIECTOMY FOR EXCISION OF ACOUSTIC NEUROMA Left 1990  ? Deaf on Left side  ? CYSTOSCOPY WITH RETROGRADE PYELOGRAM, URETEROSCOPY AND STENT PLACEMENT Bilateral 08/07/2015  ? Procedure: CYSTOSCOPY WITH BILATERAL RETROGRADE PYELOGRAM, LEFT URETEROSCOPY WITH BIOPSY AND  LEFT STENT PLACEMENT;  Surgeon: Alexis Frock, MD;  Location: [facility];  Service: Urology;  Laterality: Bilateral;  ? EYE SURGERY Bilateral 2013  ? eyelid drooping fixed  ? EYE SURGERY Bilateral   ? yag procedure after cataracts  ? left hand middle finger surgery   2015   ? cadaver bone placed   ? left shoulder surgery 2005    ? Lt knee surgery 2008 Left 2008  ? tears twice one surgery  ? lt wrist surgery 2005    ? mortone neuroma  2010  ? left foot 3rd and 4th toe  ? ROBOT ASSITED LAPAROSCOPIC NEPHROURETERECTOMY Left 09/19/2015  ? Procedure: ROBOT ASSITED LAPAROSCOPIC NEPHROURETERECTOMY WITH DIAPHRAMATIC HERNIA REPAIR;  Surgeon: Alexis Frock, MD;  Location: [facility];  Service: Urology;  Laterality: Left;  ? SP CHOLECYSTOMY    ? THYROIDECTOMY  11/2009  ? S/P Adenoma follicular vs hyperplastic  ? TONSILLECTOMY  age 28  ? TUBAL LIGATION  1976  ? WRIST SURGERY Left 2007  ? wrist cyst removed  ? ? ? ? ?Current Medications, Allergies, Family History and Social History were reviewed in [employer] record. ?  ?  ?Current Outpatient Medications  ?Medication Sig Dispense Refill  ? acetaminophen (TYLENOL) 325 MG tablet Take 650 mg by  mouth every 6 (six) hours as needed for mild pain or headache.     ? Ascorbic Acid (VITAMIN C PO) Take by mouth.    ? BIOTIN PO Take by mouth.    ? CALCIUM PO Take by mouth.    ? Cholecalciferol (VITAMIN D PO) Take by mouth.    ? Cyanocobalamin (VITAMIN B-12 SL) Place 1 Dose under the tongue daily.    ? doxycycline (VIBRA-TABS) 100 MG tablet Take 1 tablet (100 mg total) by  mouth 2 (two) times daily. (Patient taking differently: Take 100 mg by mouth daily.) 20 tablet 0  ? EPINEPHrine 0.3 mg/0.3 mL IJ SOAJ injection Inject 0.3 mg into the muscle as needed for anaphylaxis. As needed for life-threatening allergic reactions 2 each 1  ? fexofenadine (ALLEGRA) 180 MG tablet Take 180 mg by mouth daily as needed for allergies. As needed    ? guaiFENesin (MUCINEX) 600 MG 12 hr tablet Take 1,200 mg by mouth 2 (two) times daily as needed for cough or to loosen phlegm.     ? hydrochlorothiazide (HYDRODIURIL) 12.5 MG tablet Take 12.5 mg by mouth daily.    ? ketoconazole (NIZORAL) 2 % cream     ? levalbuterol (XOPENEX HFA) 45 MCG/ACT inhaler Inhale 1-2 puffs into the lungs every 4 (four) hours as needed for wheezing. 15 g 5  ? levothyroxine (SYNTHROID, LEVOTHROID) 100 MCG tablet Take 100 mcg by mouth daily.    ? liothyronine (CYTOMEL) 5 MCG tablet 5 mcg. Take one daily at 7:00AM and 12:00pm    ? loratadine (CLARITIN) 10 MG tablet Take 10 mg by mouth daily.    ? metroNIDAZOLE (METROCREAM) 0.75 % cream     ? montelukast (SINGULAIR) 10 MG tablet TAKE 1 TABLET BY MOUTH DAILY AT BEDTIME AS NEEDED 30 tablet 1  ? Nutritional Supplements (GRAPESEED EXTRACT PO) Take by mouth daily.    ? Omega-3 Fatty Acids (FISH OIL PO) Take by mouth.    ? PRESCRIPTION MEDICATION Allergy shots 2 x per week either or Tuesday, Wednesday, or Thursday    ? Probiotic Product (ALIGN) 4 MG CAPS Take 1 capsule by mouth daily.    ? SHINGRIX injection ADM 0.5ML IM UTD  0  ? VITAMIN E PO Take by mouth.    ? Wheat Dextrin (BENEFIBER PO) Take 1 Dose by mouth daily.     ? ZETIA 10 MG tablet Take 10 mg by mouth daily.     ? ?No current facility-administered medications for this visit.  ? ? ?Review of Systems: ?No chest pain. No shortness of breath. No urinary complaints

## 2022-02-16 ENCOUNTER — Other Ambulatory Visit: Payer: Self-pay

## 2022-02-16 ENCOUNTER — Encounter: Payer: Self-pay | Admitting: Emergency Medicine

## 2022-02-16 ENCOUNTER — Ambulatory Visit (INDEPENDENT_AMBULATORY_CARE_PROVIDER_SITE_OTHER): Payer: Medicare Other

## 2022-02-16 ENCOUNTER — Ambulatory Visit
Admission: EM | Admit: 2022-02-16 | Discharge: 2022-02-16 | Disposition: A | Discharge status: Home / Self Care | Payer: Medicare Other | Attending: Internal Medicine | Admitting: Internal Medicine

## 2022-02-16 DIAGNOSIS — S42352A Displaced comminuted fracture of shaft of humerus, left arm, initial encounter for closed fracture: Secondary | ICD-10-CM | POA: Diagnosis not present

## 2022-02-16 DIAGNOSIS — M25512 Pain in left shoulder: Secondary | ICD-10-CM | POA: Diagnosis not present

## 2022-02-16 DIAGNOSIS — S42292A Other displaced fracture of upper end of left humerus, initial encounter for closed fracture: Secondary | ICD-10-CM | POA: Diagnosis not present

## 2022-02-16 NOTE — ED Provider Notes (Signed)
?[facility] ? ? ? ?CSN: [id number] ?Arrival date & time: 02/16/22  1142 ? ? ?  ? ?History   ?Chief Complaint ?Chief Complaint  ?Patient presents with  ? Arm Pain  ? Fall  ? ? ?HPI ?Emily Maxwell is a 81 y.o. female.  ? ?Presents with left upper arm pain that started today after a fall.  Patient reports that she fell at approximately 10:30 AM while carrying a plant.  She states that she tripped over a hose and landed directly on her left arm.  Denies hitting head or losing consciousness.  Patient does not take blood thinners.  Patient has limited range of motion due to pain.  Pain is present in the left shoulder and left upper arm.  Denies pain in the elbow, left forearm, left hand, left wrist.  Patient took leftover Norco at home with minimal improvement in pain. ? ? ?Arm Pain ? ?Fall ? ? ?Past Medical History:  ?Diagnosis Date  ? Aneurysm, ascending aorta ([place]) 2013  ? 35m  ? Anxiety   ? hx of  anxiety , had stress test several years ago per patient that was normal   ? Arthritis   ? thumbs , oa  ? Ascending aorta dilation ([facility]) 08/07/2012  ? Asthma   ? Asthma   ? Cancer [facility]   ? cancer left kidney  ? Cancer of kidney ([place]   ? Had right kidney removed  ? Complication of anesthesia   ? pulse rate slow in past, did ok with recent anesthesia  ? Deaf   ? left ear  ? Dysrhythmia   ? occasional arrythmia, no meds taken for  ? Hematuria   ? no blood seen recently  ? Hematuria   ? Hyperlipidemia   ? Hyperplastic colon polyp   ? Hypertension   ? Hypothyroidism   ? IFG (impaired fasting glucose)   ? Nodule of left lung   ? stable, surveillance  Dr SBrigitte Pulse ? Numbness   ? left thigh, when walking or standing for long periods of time  ? Osteopenia   ? RLS (restless legs syndrome)   ? Rosacea   ? Simple endometrial hyperplasia years ago  ? Thoracic aortic aneurysm ([facility]   ? stable per last chest ct 07-11-14 [facility] on chart-   ? Vasovagal reaction   ? hx of   ? ? ?Patient Active Problem List  ? Diagnosis Date Noted   ? Pain of left hand 10/26/2018  ? Renal mass 09/19/2015  ? Laryngopharyngeal reflux (LPR) 06/28/2015  ? Other allergic rhinitis 06/28/2015  ? Essential hypertension, benign 07/10/2013  ? Pure hypercholesterolemia 07/10/2013  ? Restless leg syndrome 07/10/2013  ? Asthma, chronic 07/10/2013  ? Ascending aorta dilation ([facility]) 08/07/2012  ? ? ?Past Surgical History:  ?Procedure Laterality Date  ? acoustic neuroma S/P resection Lt ear deafness 1990    ? tinnitus  from left ear  ? BTL    ? bumonectomy Bilateral   ? x 2  ? CATARACT EXTRACTION Bilateral 2004  ? CRANIECTOMY FOR EXCISION OF ACOUSTIC NEUROMA Left 1990  ? Deaf on Left side  ? CYSTOSCOPY WITH RETROGRADE PYELOGRAM, URETEROSCOPY AND STENT PLACEMENT Bilateral 08/07/2015  ? Procedure: CYSTOSCOPY WITH BILATERAL RETROGRADE PYELOGRAM, LEFT URETEROSCOPY WITH BIOPSY AND  LEFT STENT PLACEMENT;  Surgeon: TAlexis Frock MD;  [facility];  Service: Urology;  Laterality: Bilateral;  ? EYE SURGERY Bilateral 2013  ? eyelid drooping fixed  ? EYE SURGERY Bilateral   ?  yag procedure after cataracts  ? left hand middle finger surgery   2015   ? cadaver bone placed   ? left shoulder surgery 2005    ? Lt knee surgery 2008 Left 2008  ? tears twice one surgery  ? lt wrist surgery 2005    ? mortone neuroma  2010  ? left foot 3rd and 4th toe  ? ROBOT ASSITED LAPAROSCOPIC NEPHROURETERECTOMY Left 09/19/2015  ? Procedure: ROBOT ASSITED LAPAROSCOPIC NEPHROURETERECTOMY WITH DIAPHRAMATIC HERNIA REPAIR;  Surgeon: Alexis Frock, MD;  [facility];  Service: Urology;  Laterality: Left;  ? SP CHOLECYSTOMY    ? THYROIDECTOMY  11/2009  ? S/P Adenoma follicular vs hyperplastic  ? TONSILLECTOMY  age 64  ? TUBAL LIGATION  1976  ? WRIST SURGERY Left 2007  ? wrist cyst removed  ? ? ?OB History   ? ? Gravida  ?4  ? Para  ?4  ? Term  ?4  ? Preterm  ?   ? AB  ?   ? Living  ?4  ?  ? ? SAB  ?   ? IAB  ?   ? Ectopic  ?   ? Multiple  ?   ? Live Births  ?   ?   ?  ?  ? ? ? ?Home Medications   ? ?Prior to  Admission medications   ?Medication Sig Start Date End Date Taking? Authorizing Provider  ?acetaminophen (TYLENOL) 325 MG tablet Take 650 mg by mouth every 6 (six) hours as needed for mild pain or headache.     [provider]  ?Ascorbic Acid (VITAMIN C PO) Take by mouth.    [provider]  ?BIOTIN PO Take by mouth.    [provider]  ?CALCIUM PO Take by mouth.    [provider]  ?Cholecalciferol (VITAMIN D PO) Take by mouth.    [provider]  ?Cyanocobalamin (VITAMIN B-12 SL) Place 1 Dose under the tongue daily.    [provider]  ?doxycycline (VIBRA-TABS) 100 MG tablet Take 1 tablet (100 mg total) by mouth 2 (two) times daily. ?Patient taking differently: Take 100 mg by mouth daily. 02/10/18   Evelina Bucy, DPM  ?EPINEPHrine 0.3 mg/0.3 mL IJ SOAJ injection Inject 0.3 mg into the muscle as needed for anaphylaxis. As needed for life-threatening allergic reactions 07/08/20   Kozlow, Donnamarie Poag, MD  ?fexofenadine (ALLEGRA) 180 MG tablet Take 180 mg by mouth daily as needed for allergies. As needed    [provider]  ?guaiFENesin (MUCINEX) 600 MG 12 hr tablet Take 1,200 mg by mouth 2 (two) times daily as needed for cough or to loosen phlegm.     [provider]  ?hydrochlorothiazide (HYDRODIURIL) 12.5 MG tablet Take 12.5 mg by mouth daily. 08/11/19   [provider]  ?ketoconazole (NIZORAL) 2 % cream  02/27/18   [provider]  ?levalbuterol Penne Lash HFA) 45 MCG/ACT inhaler Inhale 1-2 puffs into the lungs every 4 (four) hours as needed for wheezing. 07/08/20   Kozlow, Donnamarie Poag, MD  ?levothyroxine (SYNTHROID, LEVOTHROID) 100 MCG tablet Take 100 mcg by mouth daily.    [provider]  ?liothyronine (CYTOMEL) 5 MCG tablet 5 mcg. Take one daily at 7:00AM and 12:00pm    [provider]  ?loratadine (CLARITIN) 10 MG tablet Take 10 mg by mouth daily.    [provider]  ?metroNIDAZOLE (METROCREAM) 0.75 %  cream  02/10/18   [provider]  ?montelukast (SINGULAIR)  10 MG tablet TAKE 1 TABLET BY MOUTH DAILY AT BEDTIME AS NEEDED 01/28/22   Kozlow, Donnamarie Poag, MD  ?Nutritional Supplements (GRAPESEED EXTRACT PO) Take by mouth daily.    [provider]  ?Omega-3 Fatty Acids (FISH OIL PO) Take by mouth.    [provider]  ?PRESCRIPTION MEDICATION Allergy shots 2 x per week either or Tuesday, Wednesday, or Thursday    [provider]  ?Probiotic Product (ALIGN) 4 MG CAPS Take 1 capsule by mouth daily.    [provider]  ?[facility] injection ADM 0.5ML IM UTD 12/04/17   [provider]  ?VITAMIN E PO Take by mouth.    [provider]  ?Wheat Dextrin (BENEFIBER PO) Take 1 Dose by mouth daily.    [provider]  ?ZETIA 10 MG tablet Take 10 mg by mouth daily.  07/06/13   [provider]  ? ? ?Family History ?Family History  ?Problem Relation Age of Onset  ? Cancer Father   ? Cancer Maternal Grandmother   ? Heart disease Maternal Grandfather   ? Stroke Maternal Grandfather   ? Hypertension Mother   ? ? ?Social History ?Social History  ? ?Tobacco Use  ? Smoking status: Never  ? Smokeless tobacco: Never  ?Substance Use Topics  ? Alcohol use: Yes  ?  Comment: occasional glass of wine   ? Drug use: No  ? ? ? ?Allergies   ?Avelox [moxifloxacin hcl in nacl], Epinephrine, Tramadol, Penicillins, and Sulfa antibiotics ? ? ?Review of Systems ?Review of Systems ?Per HPI ? ?Physical Exam ?Triage Vital Signs ?ED Triage Vitals  ?Enc Vitals Group  ?   BP 02/16/22 1318 117/76  ?   Pulse Rate 02/16/22 1318 78  ?   Resp 02/16/22 1318 18  ?   Temp 02/16/22 1318 98.1 ?F (36.7 ?C)  ?   Temp Source 02/16/22 1318 Oral  ?   SpO2 02/16/22 1318 95 %  ?   Weight --   ?   Height --   ?   Head Circumference --   ?   Peak Flow --   ?   Pain Score 02/16/22 1319 10  ?   Pain Loc --   ?   Pain Edu? --   ?   Excl. in [place]? --   ? ?No data found. ? ?Updated Vital Signs ?BP 117/76 (BP Location:  Right Arm)   Pulse 78   Temp 98.1 ?F (36.7 ?C) (Oral)   Resp 18   LMP 07/11/1983   SpO2 95%  ? ?Visual Acuity ?Right Eye Distance:   ?Left Eye Distance:   ?Bilateral Distance:   ? ?Right Eye Near:   ?L

## 2022-02-16 NOTE — ED Triage Notes (Signed)
Pt here for trip and fall with pain in left upper arm and shoulder; no LOC; CMS intact  ?

## 2022-02-17 ENCOUNTER — Encounter (HOSPITAL_COMMUNITY): Payer: Self-pay | Admitting: Orthopedic Surgery

## 2022-02-17 ENCOUNTER — Other Ambulatory Visit: Payer: Self-pay

## 2022-02-17 DIAGNOSIS — S42202A Unspecified fracture of upper end of left humerus, initial encounter for closed fracture: Secondary | ICD-10-CM | POA: Diagnosis not present

## 2022-02-17 NOTE — H&P (Signed)
?Patient's anticipated LOS is less than 2 midnights, meeting these requirements: ?- Younger than 32 ?- Lives within 1 hour of care ?- Has a competent adult at home to recover with post-op recover ?- NO history of ? - Chronic pain requiring opiods ? - Diabetes ? - Coronary Artery Disease ? - Heart failure ? - Heart attack ? - Stroke ? - DVT/VTE ? - Cardiac arrhythmia ? - Respiratory Failure/COPD ? - Renal failure ? - Anemia ? - Advanced Liver disease ? ?  ? ?Rochell Puett is an 81 y.o. female.   ? ?Chief Complaint: left shoulder pain ? ?HPI: Pt is a 81 y.o. female complaining of left shoulder pain after recent fall. Pain had continually increased since the beginning. X-rays in the clinic show displaced left proximal humerus fracture. Risks, benefits and expectations were discussed with the patient. Patient understand the risks, benefits and expectations and wishes to proceed with surgery.  ? ?PCP:  Ginger Organ., MD ? ?D/C Plans: Home ? ?PMH: ?Past Medical History:  ?Diagnosis Date  ? Aneurysm, ascending aorta ([place]) 2013  ? 4m  ? Anxiety   ? hx of  anxiety , had stress test several years ago per patient that was normal   ? Arthritis   ? thumbs , oa  ? Ascending aorta dilation ([facility]) 08/07/2012  ? Asthma   ? Asthma   ? Cancer ([facility]   ? cancer left kidney  ? [place] of [place]   ? Had right kidney removed  ? Complication of anesthesia   ? pulse rate slow in past, did ok with recent anesthesia  ? Deaf   ? left ear  ? Dysrhythmia   ? occasional arrythmia, no meds taken for  ? Hematuria   ? no blood seen recently  ? Hematuria   ? Hyperlipidemia   ? Hyperplastic colon polyp   ? Hypertension   ? Hypothyroidism   ? IFG (impaired fasting glucose)   ? Nodule of left lung   ? stable, surveillance  Dr SBrigitte Pulse ? Numbness   ? left thigh, when walking or standing for long periods of time  ? Osteopenia   ? RLS (restless legs syndrome)   ? Rosacea   ? Simple endometrial hyperplasia years ago  ? Thoracic aortic aneurysm ([facility]    ? stable per last chest ct 07-11-14 [facility] on chart-   ? Vasovagal reaction   ? hx of   ? ? ?PSH: ?Past Surgical History:  ?Procedure Laterality Date  ? acoustic neuroma S/P resection Lt ear deafness 1990    ? tinnitus  from left ear  ? BTL    ? bumonectomy Bilateral   ? x 2  ? CATARACT EXTRACTION Bilateral 2004  ? CRANIECTOMY FOR EXCISION OF ACOUSTIC NEUROMA Left 1990  ? Deaf on Left side  ? CYSTOSCOPY WITH RETROGRADE PYELOGRAM, URETEROSCOPY AND STENT PLACEMENT Bilateral 08/07/2015  ? Procedure: CYSTOSCOPY WITH BILATERAL RETROGRADE PYELOGRAM, LEFT URETEROSCOPY WITH BIOPSY AND  LEFT STENT PLACEMENT;  Surgeon: TAlexis Frock MD;  Location: [facility];  Service: Urology;  Laterality: Bilateral;  ? EYE SURGERY Bilateral 2013  ? eyelid drooping fixed  ? EYE SURGERY Bilateral   ? yag procedure after cataracts  ? left hand middle finger surgery   2015   ? cadaver bone placed   ? left shoulder surgery 2005    ? Lt knee surgery 2008 Left 2008  ? tears twice one surgery  ? lt wrist surgery 2005    ?  mortone neuroma  2010  ? left foot 3rd and 4th toe  ? ROBOT ASSITED LAPAROSCOPIC NEPHROURETERECTOMY Left 09/19/2015  ? Procedure: ROBOT ASSITED LAPAROSCOPIC NEPHROURETERECTOMY WITH DIAPHRAMATIC HERNIA REPAIR;  Surgeon: Alexis Frock, MD;  Location: [facility];  Service: Urology;  Laterality: Left;  ? SP CHOLECYSTOMY    ? THYROIDECTOMY  11/2009  ? S/P Adenoma follicular vs hyperplastic  ? TONSILLECTOMY  age 71  ? TUBAL LIGATION  1976  ? WRIST SURGERY Left 2007  ? wrist cyst removed  ? ? ?Social History:  reports that she has never smoked. She has never used smokeless tobacco. She reports current alcohol use. She reports that she does not use drugs. ?BMI: ?Estimated body mass index is 32.77 kg/m? as calculated from the following: ?  Height as of 07/02/21: '5\' 3"'$  (1.6 m). ?  Weight as of 02/05/22: 83.9 kg. ? ?No results found for: ALBUMIN ?Diabetes: ?Patient does not have a diagnosis of diabetes. ?  ?  ?Smoking Status: ?  reports that  she has never smoked. She has never used smokeless tobacco. ? ?  ?Allergies:  ?Allergies  ?Allergen Reactions  ? Ketorolac Tromethamine Palpitations  ? Avelox [Moxifloxacin Hcl In Nacl] Other (See Comments)  ?  Unable to describe reaction  ? Epinephrine Other (See Comments)  ?  Makes heart rate increase and cold sweats   ? Tramadol Other (See Comments)  ?  Unable to describe reaction  ? Penicillins Rash  ?  Childhood rxn from weekly PCN shots w/ asthma Tx ?Has patient had a PCN reaction causing immediate rash, facial/tongue/throat swelling, SOB or lightheadedness with hypotension: unknown ?Has patient had a PCN reaction causing severe rash involving mucus membranes or skin necrosis: unknown ?Has patient had a PCN reaction that required hospitalization: no   ?Has patient had a PCN reaction occurring within the last 10 years: no ?If all of the above answers are "NO", then may proceed with Cephalosporin  ? Sulfa Antibiotics Rash  ?  Tolerates non-antibiotic sulfa drugs  ? ? ?Medications: ?No current facility-administered medications for this encounter.  ? ?Current Outpatient Medications  ?Medication Sig Dispense Refill  ? acetaminophen (TYLENOL) 325 MG tablet Take 650 mg by mouth every 6 (six) hours as needed for mild pain or headache.     ? Ascorbic Acid (VITAMIN C PO) Take by mouth.    ? BIOTIN PO Take by mouth.    ? CALCIUM PO Take by mouth.    ? Cholecalciferol (VITAMIN D PO) Take by mouth.    ? Cyanocobalamin (VITAMIN B-12 SL) Place 1 Dose under the tongue daily.    ? doxycycline (VIBRA-TABS) 100 MG tablet Take 1 tablet (100 mg total) by mouth 2 (two) times daily. (Patient taking differently: Take 100 mg by mouth daily.) 20 tablet 0  ? EPINEPHrine 0.3 mg/0.3 mL IJ SOAJ injection Inject 0.3 mg into the muscle as needed for anaphylaxis. As needed for life-threatening allergic reactions 2 each 1  ? fexofenadine (ALLEGRA) 180 MG tablet Take 180 mg by mouth daily as needed for allergies. As needed    ? guaiFENesin  (MUCINEX) 600 MG 12 hr tablet Take 1,200 mg by mouth 2 (two) times daily as needed for cough or to loosen phlegm.     ? hydrochlorothiazide (HYDRODIURIL) 12.5 MG tablet Take 12.5 mg by mouth daily.    ? ketoconazole (NIZORAL) 2 % cream     ? levalbuterol (XOPENEX HFA) 45 MCG/ACT inhaler Inhale 1-2 puffs into the lungs every 4 (four)  hours as needed for wheezing. 15 g 5  ? levothyroxine (SYNTHROID, LEVOTHROID) 100 MCG tablet Take 100 mcg by mouth daily.    ? liothyronine (CYTOMEL) 5 MCG tablet 5 mcg. Take one daily at 7:00AM and 12:00pm    ? loratadine (CLARITIN) 10 MG tablet Take 10 mg by mouth daily.    ? metroNIDAZOLE (METROCREAM) 0.75 % cream     ? montelukast (SINGULAIR) 10 MG tablet TAKE 1 TABLET BY MOUTH DAILY AT BEDTIME AS NEEDED 30 tablet 1  ? Nutritional Supplements (GRAPESEED EXTRACT PO) Take by mouth daily.    ? Omega-3 Fatty Acids (FISH OIL PO) Take by mouth.    ? PRESCRIPTION MEDICATION Allergy shots 2 x per week either or Tuesday, Wednesday, or Thursday    ? Probiotic Product (ALIGN) 4 MG CAPS Take 1 capsule by mouth daily.    ? SHINGRIX injection ADM 0.5ML IM UTD  0  ? VITAMIN E PO Take by mouth.    ? Wheat Dextrin (BENEFIBER PO) Take 1 Dose by mouth daily.    ? ZETIA 10 MG tablet Take 10 mg by mouth daily.     ? ? ?No results found for this or any previous visit (from the past 48 hour(s)). ?DG Shoulder Left ? ?Result Date: 02/16/2022 ?CLINICAL DATA:  Tripped and fell.  Left shoulder injury and pain. EXAM: LEFT SHOULDER - 2+ VIEW COMPARISON:  None Available. FINDINGS: Comminuted fracture seen involving the left humeral head and neck. Inferior subluxation of the humeral head is seen likely due to presence of a large shoulder joint effusion. IMPRESSION: Comminuted fracture of the left humeral head and neck, with probable large shoulder joint effusion. Electronically Signed   By: Marlaine Hind M.D.   On: 02/16/2022 14:06  ? ?DG Humerus Left ? ?Result Date: 02/16/2022 ?CLINICAL DATA:  Fall.  Left upper arm  pain. EXAM: LEFT HUMERUS - 2+ VIEW COMPARISON:  None Available. FINDINGS: Comminuted fracture is seen involving the left humeral head and neck. No evidence of distal humerus fracture. Generalized osteopenia n

## 2022-02-17 NOTE — Progress Notes (Signed)
For Short Stay: ?[facility] appointment date: N/A ?Date of COVID positive in last 62 days:N/A ? ?Bowel Prep reminder: N/A ? ? ?For Anesthesia: ?PCP - Ginger Organ., MD ?Cardiothoracic - Grace Isaac MD not since 2013 ? ?Chest x-ray - 09/18/21 in epic ?EKG - 02/18/22 ?Stress Test - greater than 2 years Dr. Adora Fridge ?ECHO - greater than 2 years Dr. Adora Fridge ?Cardiac Cath - greater than 2 years Pacemaker/ICD device last checked: N/A ?Pacemaker orders received: N/A ?Device Rep notified:N/A ? ?Spinal Cord Stimulator: N/A ? ?Sleep Study - N/A ?CPAP - N/A ? ?Fasting Blood Sugar - N/A ?Checks Blood Sugar __N/A___ times a day ?Date and result of last Hgb A1c-N/A ? ?Blood Thinner Instructions: N/A ?Aspirin Instructions: N/A ?Last Dose: N/A ? ?Activity level: Can go up a flight of stairs and activities of daily living without stopping and without chest pain and/or shortness of breath ?   ?Anesthesia review: Thoracic Aortic Aneurysm mildly dilated descending aorta approximately 3.8-3.9 cm in size, HTN, history of kidney cancer  ? ?Patient denies shortness of breath, fever, cough and chest pain at PAT appointment ? ? ?Patient verbalized understanding of instructions that were given to them at the PAT appointment. Patient was also instructed that they will need to review over the PAT instructions again at home before surgery.  ?

## 2022-02-18 ENCOUNTER — Encounter (HOSPITAL_COMMUNITY): Payer: Self-pay | Admitting: Orthopedic Surgery

## 2022-02-18 ENCOUNTER — Inpatient Hospital Stay (HOSPITAL_COMMUNITY): Payer: Medicare Other | Admitting: Physician Assistant

## 2022-02-18 ENCOUNTER — Observation Stay (HOSPITAL_COMMUNITY): Payer: Medicare Other

## 2022-02-18 ENCOUNTER — Observation Stay (HOSPITAL_COMMUNITY)
Admission: RE | Admit: 2022-02-18 | Discharge: 2022-02-19 | Disposition: A | Discharge status: Home / Self Care | Payer: Medicare Other | Source: Ambulatory Visit | Attending: Orthopedic Surgery | Admitting: Orthopedic Surgery

## 2022-02-18 ENCOUNTER — Encounter (HOSPITAL_COMMUNITY)
Admission: RE | Disposition: A | Discharge status: Home / Self Care | Payer: Self-pay | Source: Ambulatory Visit | Attending: Orthopedic Surgery

## 2022-02-18 ENCOUNTER — Inpatient Hospital Stay (HOSPITAL_BASED_OUTPATIENT_CLINIC_OR_DEPARTMENT_OTHER): Payer: Medicare Other | Admitting: Physician Assistant

## 2022-02-18 ENCOUNTER — Other Ambulatory Visit: Payer: Self-pay

## 2022-02-18 DIAGNOSIS — W19XXXA Unspecified fall, initial encounter: Secondary | ICD-10-CM | POA: Insufficient documentation

## 2022-02-18 DIAGNOSIS — Z96642 Presence of left artificial hip joint: Secondary | ICD-10-CM | POA: Diagnosis not present

## 2022-02-18 DIAGNOSIS — Z79899 Other long term (current) drug therapy: Secondary | ICD-10-CM | POA: Diagnosis not present

## 2022-02-18 DIAGNOSIS — I10 Essential (primary) hypertension: Secondary | ICD-10-CM | POA: Insufficient documentation

## 2022-02-18 DIAGNOSIS — E039 Hypothyroidism, unspecified: Secondary | ICD-10-CM

## 2022-02-18 DIAGNOSIS — F419 Anxiety disorder, unspecified: Secondary | ICD-10-CM

## 2022-02-18 DIAGNOSIS — Z96612 Presence of left artificial shoulder joint: Secondary | ICD-10-CM | POA: Diagnosis not present

## 2022-02-18 DIAGNOSIS — S42352A Displaced comminuted fracture of shaft of humerus, left arm, initial encounter for closed fracture: Secondary | ICD-10-CM | POA: Diagnosis not present

## 2022-02-18 DIAGNOSIS — S42242A 4-part fracture of surgical neck of left humerus, initial encounter for closed fracture: Secondary | ICD-10-CM | POA: Diagnosis not present

## 2022-02-18 DIAGNOSIS — Z85528 Personal history of other malignant neoplasm of kidney: Secondary | ICD-10-CM | POA: Diagnosis not present

## 2022-02-18 DIAGNOSIS — Z01818 Encounter for other preprocedural examination: Principal | ICD-10-CM

## 2022-02-18 DIAGNOSIS — J45909 Unspecified asthma, uncomplicated: Secondary | ICD-10-CM | POA: Diagnosis not present

## 2022-02-18 DIAGNOSIS — Z471 Aftercare following joint replacement surgery: Secondary | ICD-10-CM | POA: Diagnosis not present

## 2022-02-18 DIAGNOSIS — G8918 Other acute postprocedural pain: Secondary | ICD-10-CM | POA: Diagnosis not present

## 2022-02-18 DIAGNOSIS — S42202A Unspecified fracture of upper end of left humerus, initial encounter for closed fracture: Secondary | ICD-10-CM

## 2022-02-18 HISTORY — PX: REVERSE SHOULDER ARTHROPLASTY: SHX5054

## 2022-02-18 HISTORY — DX: Personal history of other diseases of the digestive system: Z87.19

## 2022-02-18 LAB — GLUCOSE, CAPILLARY: Glucose-Capillary: 169 mg/dL — ABNORMAL HIGH (ref 70–99)

## 2022-02-18 LAB — BASIC METABOLIC PANEL
Anion gap: 10 (ref 5–15)
BUN: 14 mg/dL (ref 8–23)
CO2: 26 mmol/L (ref 22–32)
Calcium: 9.3 mg/dL (ref 8.9–10.3)
Chloride: 94 mmol/L — ABNORMAL LOW (ref 98–111)
Creatinine, Ser: 0.86 mg/dL (ref 0.44–1.00)
GFR, Estimated: >60 mL/min (ref >60)
Glucose, Bld: 130 mg/dL — ABNORMAL HIGH (ref 70–99)
Potassium: 3.3 mmol/L — ABNORMAL LOW (ref 3.5–5.1)
Sodium: 130 mmol/L — ABNORMAL LOW (ref 135–145)

## 2022-02-18 LAB — CBC
HCT: 37 % (ref 36.0–46.0)
Hemoglobin: 13.2 g/dL (ref 12.0–15.0)
MCH: 32.5 pg (ref 26.0–34.0)
MCHC: 35.7 g/dL (ref 30.0–36.0)
MCV: 91.1 fL (ref 80.0–100.0)
Platelets: 188 10*3/uL (ref 150–400)
RBC: 4.06 MIL/uL (ref 3.87–5.11)
RDW: 11.3 % — ABNORMAL LOW (ref 11.5–15.5)
WBC: 6.9 10*3/uL (ref 4.0–10.5)
nRBC: 0 % (ref 0.0–0.2)

## 2022-02-18 LAB — SURGICAL PCR SCREEN
MRSA, PCR: NEGATIVE
Staphylococcus aureus: NEGATIVE

## 2022-02-18 SURGERY — ARTHROPLASTY, SHOULDER, TOTAL, REVERSE
Anesthesia: General | Site: Shoulder | Laterality: Left

## 2022-02-18 MED ORDER — HYDROCODONE-ACETAMINOPHEN 10-325 MG PO TABS: 1.0000 | ORAL_TABLET | Freq: Four times a day (QID) | ORAL | 0 refills | Status: DC | PRN

## 2022-02-18 MED ORDER — TRANEXAMIC ACID-NACL 1000-0.7 MG/100ML-% IV SOLN
1000.0000 mg | Freq: Once | INTRAVENOUS | Status: AC
  Administered 2022-02-18: 1000 mg via INTRAVENOUS
  Filled 2022-02-18: qty 100

## 2022-02-18 MED ORDER — ONDANSETRON HCL 4 MG/2ML IJ SOLN: 4.0000 mg | Freq: Four times a day (QID) | INTRAMUSCULAR | Status: DC | PRN

## 2022-02-18 MED ORDER — MORPHINE SULFATE (PF) 2 MG/ML IV SOLN: 0.5000 mg | INTRAVENOUS | Status: DC | PRN

## 2022-02-18 MED ORDER — TRIAMCINOLONE ACETONIDE 0.5 % EX CREA
TOPICAL_CREAM | Freq: Two times a day (BID) | CUTANEOUS | Status: DC
Start: 2022-02-18 — End: 2022-02-19
  Filled 2022-02-18: qty 15

## 2022-02-18 MED ORDER — MONTELUKAST SODIUM 10 MG PO TABS: 10.0000 mg | ORAL_TABLET | Freq: Every evening | ORAL | Status: DC | PRN

## 2022-02-18 MED ORDER — LIOTHYRONINE SODIUM 5 MCG PO TABS
5.0000 ug | ORAL_TABLET | ORAL | Status: DC
  Administered 2022-02-19: 5 ug via ORAL
  Filled 2022-02-18 (×2): qty 1

## 2022-02-18 MED ORDER — CHLORHEXIDINE GLUCONATE 0.12 % MT SOLN
15.0000 mL | Freq: Once | OROMUCOSAL | Status: AC
  Administered 2022-02-18: 15 mL via OROMUCOSAL

## 2022-02-18 MED ORDER — HYDROCHLOROTHIAZIDE 12.5 MG PO TABS
12.5000 mg | ORAL_TABLET | Freq: Every day | ORAL | Status: DC
  Administered 2022-02-19: 12.5 mg via ORAL
  Filled 2022-02-18: qty 1

## 2022-02-18 MED ORDER — CEFAZOLIN SODIUM-DEXTROSE 2-4 GM/100ML-% IV SOLN
2.0000 g | INTRAVENOUS | Status: AC
  Administered 2022-02-18: 2 g via INTRAVENOUS
  Filled 2022-02-18: qty 100

## 2022-02-18 MED ORDER — METOCLOPRAMIDE HCL 5 MG/ML IJ SOLN: 5.0000 mg | Freq: Three times a day (TID) | INTRAMUSCULAR | Status: DC | PRN

## 2022-02-18 MED ORDER — MENAQUINONE-7 90 MCG/0.5ML PO LIQD: Freq: Every morning | ORAL | Status: DC

## 2022-02-18 MED ORDER — GABAPENTIN 100 MG PO CAPS
100.0000 mg | ORAL_CAPSULE | Freq: Every day | ORAL | Status: DC | PRN
  Administered 2022-02-18: 100 mg via ORAL
  Filled 2022-02-18: qty 1

## 2022-02-18 MED ORDER — ADULT ONE DAILY GUMMIES PO CHEW
CHEWABLE_TABLET | Freq: Every morning | ORAL | Status: DC
Start: 2022-02-19 — End: 2022-02-18

## 2022-02-18 MED ORDER — LORATADINE 10 MG PO TABS
10.0000 mg | ORAL_TABLET | Freq: Every day | ORAL | Status: DC
  Administered 2022-02-19: 10 mg via ORAL
  Filled 2022-02-18: qty 1

## 2022-02-18 MED ORDER — DEXAMETHASONE SODIUM PHOSPHATE 10 MG/ML IJ SOLN
INTRAMUSCULAR | Status: DC | PRN
  Administered 2022-02-18: 5 mg via INTRAVENOUS

## 2022-02-18 MED ORDER — PHENYLEPHRINE 80 MCG/ML (10ML) SYRINGE FOR IV PUSH (FOR BLOOD PRESSURE SUPPORT)
PREFILLED_SYRINGE | INTRAVENOUS | Status: DC | PRN
  Administered 2022-02-18 (×8): 80 ug via INTRAVENOUS

## 2022-02-18 MED ORDER — ALIGN PO CHEW: 2.0000 | CHEWABLE_TABLET | Freq: Every morning | ORAL | Status: DC

## 2022-02-18 MED ORDER — OXYCODONE HCL 5 MG/5ML PO SOLN: 5.0000 mg | Freq: Once | ORAL | Status: DC | PRN

## 2022-02-18 MED ORDER — HYDROCODONE-ACETAMINOPHEN 5-325 MG PO TABS
1.0000 | ORAL_TABLET | ORAL | Status: DC | PRN
  Administered 2022-02-18 – 2022-02-19 (×3): 1 via ORAL
  Filled 2022-02-18 (×3): qty 1

## 2022-02-18 MED ORDER — TURMERIC 500 MG PO CAPS: 1000.0000 mg | ORAL_CAPSULE | Freq: Every morning | ORAL | Status: DC

## 2022-02-18 MED ORDER — PROPOFOL 10 MG/ML IV BOLUS
INTRAVENOUS | Status: DC | PRN
  Administered 2022-02-18: 130 mg via INTRAVENOUS

## 2022-02-18 MED ORDER — METRONIDAZOLE 0.75 % EX CREA: 1.0000 "application " | TOPICAL_CREAM | Freq: Every day | CUTANEOUS | Status: DC | PRN

## 2022-02-18 MED ORDER — FENTANYL CITRATE PF 50 MCG/ML IJ SOSY
50.0000 ug | PREFILLED_SYRINGE | INTRAMUSCULAR | Status: DC
  Administered 2022-02-18: 25 ug via INTRAVENOUS
  Filled 2022-02-18: qty 2

## 2022-02-18 MED ORDER — PHENYLEPHRINE 80 MCG/ML (10ML) SYRINGE FOR IV PUSH (FOR BLOOD PRESSURE SUPPORT)
PREFILLED_SYRINGE | INTRAVENOUS | Status: AC
  Filled 2022-02-18: qty 10

## 2022-02-18 MED ORDER — GRAPESEED EXTRACT 500-50 MG PO CAPS: ORAL_CAPSULE | Freq: Every morning | ORAL | Status: DC

## 2022-02-18 MED ORDER — ONDANSETRON HCL 4 MG PO TABS: 4.0000 mg | ORAL_TABLET | Freq: Four times a day (QID) | ORAL | Status: DC | PRN

## 2022-02-18 MED ORDER — BUPIVACAINE-EPINEPHRINE (PF) 0.25% -1:200000 IJ SOLN
INTRAMUSCULAR | Status: DC | PRN
  Administered 2022-02-18: 15 mL

## 2022-02-18 MED ORDER — OXYCODONE HCL 5 MG PO TABS: 5.0000 mg | ORAL_TABLET | Freq: Once | ORAL | Status: DC | PRN

## 2022-02-18 MED ORDER — ACETAMINOPHEN 500 MG PO TABS: 500.0000 mg | ORAL_TABLET | Freq: Four times a day (QID) | ORAL | Status: DC | PRN

## 2022-02-18 MED ORDER — ACETAMINOPHEN 10 MG/ML IV SOLN: 1000.0000 mg | Freq: Once | INTRAVENOUS | Status: DC | PRN

## 2022-02-18 MED ORDER — VITAMIN D 25 MCG (1000 UNIT) PO TABS
5000.0000 [IU] | ORAL_TABLET | Freq: Every day | ORAL | Status: DC
  Administered 2022-02-19: 5000 [IU] via ORAL
  Filled 2022-02-18: qty 5

## 2022-02-18 MED ORDER — LIDOCAINE HCL (CARDIAC) PF 100 MG/5ML IV SOSY
PREFILLED_SYRINGE | INTRAVENOUS | Status: DC | PRN
  Administered 2022-02-18: 100 mg via INTRAVENOUS

## 2022-02-18 MED ORDER — POLYETHYLENE GLYCOL 3350 17 G PO PACK: 17.0000 g | PACK | Freq: Every day | ORAL | Status: DC | PRN

## 2022-02-18 MED ORDER — BUPIVACAINE LIPOSOME 1.3 % IJ SUSP
INTRAMUSCULAR | Status: DC | PRN
  Administered 2022-02-18: 10 mL via PERINEURAL

## 2022-02-18 MED ORDER — GLUCOSAMINE-CHONDROITIN 500-400 MG PO CAPS
ORAL_CAPSULE | Freq: Every morning | ORAL | Status: DC
Start: 2022-02-19 — End: 2022-02-18

## 2022-02-18 MED ORDER — KRILL OIL 1000 MG PO CAPS: ORAL_CAPSULE | Freq: Every morning | ORAL | Status: DC

## 2022-02-18 MED ORDER — DOCUSATE SODIUM 100 MG PO CAPS: 100.0000 mg | ORAL_CAPSULE | Freq: Every day | ORAL | Status: DC | PRN

## 2022-02-18 MED ORDER — AMISULPRIDE (ANTIEMETIC) 5 MG/2ML IV SOLN
INTRAVENOUS | Status: AC
Start: 2022-02-18 — End: 2022-02-19
  Filled 2022-02-18: qty 2

## 2022-02-18 MED ORDER — PHENOL 1.4 % MT LIQD: 1.0000 | OROMUCOSAL | Status: DC | PRN

## 2022-02-18 MED ORDER — MAGNESIUM MALATE 1250 (141.7 MG) MG PO TABS: 1350.0000 mg | ORAL_TABLET | Freq: Every day | ORAL | Status: DC

## 2022-02-18 MED ORDER — STERILE WATER FOR IRRIGATION IR SOLN
Status: DC | PRN
  Administered 2022-02-18: 2000 mL

## 2022-02-18 MED ORDER — ADULT MULTIVITAMIN W/MINERALS CH
1.0000 | ORAL_TABLET | Freq: Every day | ORAL | Status: DC
  Administered 2022-02-19: 1 via ORAL
  Filled 2022-02-18: qty 1

## 2022-02-18 MED ORDER — FOLIC ACID 1 MG PO TABS
1.0000 mg | ORAL_TABLET | Freq: Every day | ORAL | Status: DC
  Administered 2022-02-19: 1 mg via ORAL
  Filled 2022-02-18: qty 1

## 2022-02-18 MED ORDER — BUPIVACAINE-EPINEPHRINE (PF) 0.25% -1:200000 IJ SOLN
INTRAMUSCULAR | Status: AC
  Filled 2022-02-18: qty 30

## 2022-02-18 MED ORDER — DEXAMETHASONE SODIUM PHOSPHATE 10 MG/ML IJ SOLN
INTRAMUSCULAR | Status: AC
  Filled 2022-02-18: qty 1

## 2022-02-18 MED ORDER — DOCUSATE SODIUM 100 MG PO CAPS
100.0000 mg | ORAL_CAPSULE | Freq: Two times a day (BID) | ORAL | Status: DC
  Administered 2022-02-18 – 2022-02-19 (×2): 100 mg via ORAL
  Filled 2022-02-18 (×2): qty 1

## 2022-02-18 MED ORDER — ACETAMINOPHEN 160 MG/5ML PO SOLN: 325.0000 mg | ORAL | Status: DC | PRN

## 2022-02-18 MED ORDER — METOCLOPRAMIDE HCL 5 MG PO TABS: 5.0000 mg | ORAL_TABLET | Freq: Three times a day (TID) | ORAL | Status: DC | PRN

## 2022-02-18 MED ORDER — ROCURONIUM BROMIDE 10 MG/ML (PF) SYRINGE
PREFILLED_SYRINGE | INTRAVENOUS | Status: AC
  Filled 2022-02-18: qty 10

## 2022-02-18 MED ORDER — ONDANSETRON HCL 4 MG/2ML IJ SOLN
INTRAMUSCULAR | Status: AC
  Filled 2022-02-18: qty 2

## 2022-02-18 MED ORDER — ACETAMINOPHEN 325 MG PO TABS
325.0000 mg | ORAL_TABLET | Freq: Four times a day (QID) | ORAL | Status: DC | PRN
  Administered 2022-02-18: 650 mg via ORAL
  Filled 2022-02-18: qty 2

## 2022-02-18 MED ORDER — ORAL CARE MOUTH RINSE: 15.0000 mL | Freq: Once | OROMUCOSAL | Status: AC

## 2022-02-18 MED ORDER — ROCURONIUM BROMIDE 10 MG/ML (PF) SYRINGE
PREFILLED_SYRINGE | INTRAVENOUS | Status: DC | PRN
Start: 2022-02-18 — End: 2022-02-18
  Administered 2022-02-18: 70 mg via INTRAVENOUS

## 2022-02-18 MED ORDER — LEVOTHYROXINE SODIUM 100 MCG PO TABS
100.0000 ug | ORAL_TABLET | Freq: Every day | ORAL | Status: DC
  Administered 2022-02-19: 100 ug via ORAL
  Filled 2022-02-18: qty 1

## 2022-02-18 MED ORDER — RISAQUAD PO CAPS
1.0000 | ORAL_CAPSULE | Freq: Every day | ORAL | Status: DC
  Administered 2022-02-19: 1 via ORAL
  Filled 2022-02-18: qty 1

## 2022-02-18 MED ORDER — LORATADINE 10 MG PO TABS: 10.0000 mg | ORAL_TABLET | Freq: Every day | ORAL | Status: DC

## 2022-02-18 MED ORDER — SODIUM CHLORIDE 0.9 % IV SOLN: INTRAVENOUS | Status: DC

## 2022-02-18 MED ORDER — BUPIVACAINE HCL (PF) 0.5 % IJ SOLN
INTRAMUSCULAR | Status: DC | PRN
  Administered 2022-02-18: 10 mL via PERINEURAL

## 2022-02-18 MED ORDER — SODIUM CHLORIDE 0.9 % IR SOLN
Status: DC | PRN
  Administered 2022-02-18: 1000 mL

## 2022-02-18 MED ORDER — ACETAMINOPHEN 325 MG PO TABS: 325.0000 mg | ORAL_TABLET | ORAL | Status: DC | PRN

## 2022-02-18 MED ORDER — LEVALBUTEROL TARTRATE 45 MCG/ACT IN AERO: 1.0000 | INHALATION_SPRAY | RESPIRATORY_TRACT | Status: DC | PRN

## 2022-02-18 MED ORDER — ONDANSETRON HCL 4 MG/2ML IJ SOLN
INTRAMUSCULAR | Status: DC | PRN
  Administered 2022-02-18: 4 mg via INTRAVENOUS

## 2022-02-18 MED ORDER — PROPOFOL 10 MG/ML IV BOLUS
INTRAVENOUS | Status: AC
  Filled 2022-02-18: qty 20

## 2022-02-18 MED ORDER — LACTATED RINGERS IV SOLN: INTRAVENOUS | Status: DC

## 2022-02-18 MED ORDER — LIDOCAINE HCL (PF) 2 % IJ SOLN
INTRAMUSCULAR | Status: AC
  Filled 2022-02-18: qty 5

## 2022-02-18 MED ORDER — ASCORBIC ACID 500 MG PO TABS
1000.0000 mg | ORAL_TABLET | Freq: Every evening | ORAL | Status: DC
  Administered 2022-02-18: 1000 mg via ORAL
  Filled 2022-02-18: qty 2

## 2022-02-18 MED ORDER — OMEGA-3-ACID ETHYL ESTERS 1 G PO CAPS
2.0000 g | ORAL_CAPSULE | Freq: Every day | ORAL | Status: DC
  Administered 2022-02-19: 2 g via ORAL
  Filled 2022-02-18: qty 2

## 2022-02-18 MED ORDER — MENTHOL 3 MG MT LOZG: 1.0000 | LOZENGE | OROMUCOSAL | Status: DC | PRN

## 2022-02-18 MED ORDER — BIOTIN 2.5 MG PO TABS: ORAL_TABLET | Freq: Every morning | ORAL | Status: DC

## 2022-02-18 MED ORDER — DOXYCYCLINE HYCLATE 100 MG PO TABS
100.0000 mg | ORAL_TABLET | Freq: Every day | ORAL | Status: DC
  Administered 2022-02-19: 100 mg via ORAL
  Filled 2022-02-18: qty 1

## 2022-02-18 MED ORDER — SUGAMMADEX SODIUM 200 MG/2ML IV SOLN
INTRAVENOUS | Status: DC | PRN
  Administered 2022-02-18: 200 mg via INTRAVENOUS

## 2022-02-18 MED ORDER — VITAMIN E 45 MG (100 UNIT) PO CAPS
1000.0000 [IU] | ORAL_CAPSULE | Freq: Every day | ORAL | Status: DC
  Administered 2022-02-19: 1000 [IU] via ORAL
  Filled 2022-02-18: qty 10

## 2022-02-18 MED ORDER — CEFAZOLIN SODIUM-DEXTROSE 2-4 GM/100ML-% IV SOLN
2.0000 g | Freq: Four times a day (QID) | INTRAVENOUS | Status: AC
  Administered 2022-02-18 – 2022-02-19 (×3): 2 g via INTRAVENOUS
  Filled 2022-02-18 (×3): qty 100

## 2022-02-18 MED ORDER — BISACODYL 10 MG RE SUPP: 10.0000 mg | Freq: Every day | RECTAL | Status: DC | PRN

## 2022-02-18 MED ORDER — CINNAMON 500 MG PO CAPS: ORAL_CAPSULE | Freq: Every morning | ORAL | Status: DC

## 2022-02-18 MED ORDER — BARBERRY-OREG GRAPE-GOLDENSEAL 200-200-50 MG PO CAPS: ORAL_CAPSULE | Freq: Every morning | ORAL | Status: DC

## 2022-02-18 MED ORDER — FENTANYL CITRATE PF 50 MCG/ML IJ SOSY: 25.0000 ug | PREFILLED_SYRINGE | INTRAMUSCULAR | Status: DC | PRN

## 2022-02-18 MED ORDER — AMISULPRIDE (ANTIEMETIC) 5 MG/2ML IV SOLN
10.0000 mg | Freq: Once | INTRAVENOUS | Status: AC | PRN
  Administered 2022-02-18: 10 mg via INTRAVENOUS

## 2022-02-18 MED ORDER — TRANEXAMIC ACID-NACL 1000-0.7 MG/100ML-% IV SOLN
1000.0000 mg | INTRAVENOUS | Status: AC
  Administered 2022-02-18: 1000 mg via INTRAVENOUS
  Filled 2022-02-18: qty 100

## 2022-02-18 MED ORDER — MIDAZOLAM HCL 2 MG/2ML IJ SOLN
1.0000 mg | INTRAMUSCULAR | Status: DC
  Filled 2022-02-18: qty 2

## 2022-02-18 MED ORDER — EZETIMIBE 10 MG PO TABS
10.0000 mg | ORAL_TABLET | Freq: Every day | ORAL | Status: DC
  Administered 2022-02-19: 10 mg via ORAL
  Filled 2022-02-18: qty 1

## 2022-02-18 MED ORDER — GUAIFENESIN ER 600 MG PO TB12: 1200.0000 mg | ORAL_TABLET | Freq: Two times a day (BID) | ORAL | Status: DC | PRN

## 2022-02-18 SURGICAL SUPPLY — 75 items
BAG COUNTER SPONGE SURGICOUNT (BAG) ×1 IMPLANT
BAG ZIPLOCK 12X15 (MISCELLANEOUS) IMPLANT
BIT DRILL 1.6MX128 (BIT) ×1 IMPLANT
BIT DRILL 170X2.5X (BIT) IMPLANT
BIT DRL 170X2.5X (BIT) ×1
BLADE SAG 18X100X1.27 (BLADE) ×2 IMPLANT
COVER BACK TABLE 60X90IN (DRAPES) ×2 IMPLANT
COVER SURGICAL LIGHT HANDLE (MISCELLANEOUS) ×2 IMPLANT
CUP D38 DXTEND STAND PLUS 6 HU (Orthopedic Implant) ×2 IMPLANT
CUP STD D38 DXTEND PLUS 6 HU (Orthopedic Implant) IMPLANT
DRAPE INCISE IOBAN 66X45 STRL (DRAPES) ×2 IMPLANT
DRAPE ORTHO SPLIT 77X108 STRL (DRAPES) ×4
DRAPE SHEET LG 3/4 BI-LAMINATE (DRAPES) ×2 IMPLANT
DRAPE SURG ORHT 6 SPLT 77X108 (DRAPES) ×2 IMPLANT
DRAPE TOP 10253 STERILE (DRAPES) ×2 IMPLANT
DRAPE U-SHAPE 47X51 STRL (DRAPES) ×2 IMPLANT
DRILL 2.5 (BIT) ×2
DRSG ADAPTIC 3X8 NADH LF (GAUZE/BANDAGES/DRESSINGS) ×2 IMPLANT
DRSG PAD ABDOMINAL 8X10 ST (GAUZE/BANDAGES/DRESSINGS) ×2 IMPLANT
DURAPREP 26ML APPLICATOR (WOUND CARE) ×2 IMPLANT
ELECT BLADE TIP CTD 4 INCH (ELECTRODE) ×2 IMPLANT
ELECT NDL TIP 2.8 STRL (NEEDLE) ×1 IMPLANT
ELECT NEEDLE TIP 2.8 STRL (NEEDLE) ×2 IMPLANT
ELECT REM PT RETURN 15FT ADLT (MISCELLANEOUS) ×2 IMPLANT
FACESHIELD WRAPAROUND (MASK) ×2 IMPLANT
FACESHIELD WRAPAROUND OR TEAM (MASK) ×1 IMPLANT
GAUZE SPONGE 4X4 12PLY STRL (GAUZE/BANDAGES/DRESSINGS) ×2 IMPLANT
GLENOSPHERE DELTA XTEND LAT 38 (Miscellaneous) ×1 IMPLANT
GLOVE BIOGEL PI IND STRL 7.5 (GLOVE) ×1 IMPLANT
GLOVE BIOGEL PI IND STRL 8.5 (GLOVE) ×1 IMPLANT
GLOVE BIOGEL PI INDICATOR 7.5 (GLOVE) ×1
GLOVE BIOGEL PI INDICATOR 8.5 (GLOVE) ×1
GLOVE ORTHO TXT STRL SZ7.5 (GLOVE) ×2 IMPLANT
GLOVE SURG ORTHO 8.5 STRL (GLOVE) ×2 IMPLANT
GOWN STRL REUS W/ TWL XL LVL3 (GOWN DISPOSABLE) ×2 IMPLANT
GOWN STRL REUS W/TWL XL LVL3 (GOWN DISPOSABLE) ×6
KIT BASIN OR (CUSTOM PROCEDURE TRAY) ×2 IMPLANT
KIT TURNOVER KIT A (KITS) ×1 IMPLANT
MANIFOLD NEPTUNE II (INSTRUMENTS) ×2 IMPLANT
METAGLENE DELTA EXTEND (Trauma) IMPLANT
METAGLENE DXTEND (Trauma) ×2 IMPLANT
NDL MAYO CATGUT SZ4 TPR NDL (NEEDLE) IMPLANT
NEEDLE MAYO CATGUT SZ4 (NEEDLE) IMPLANT
NS IRRIG 1000ML POUR BTL (IV SOLUTION) ×2 IMPLANT
PACK SHOULDER (CUSTOM PROCEDURE TRAY) ×2 IMPLANT
PIN GUIDE 1.2 (PIN) ×1 IMPLANT
PIN GUIDE GLENOPHERE 1.5MX300M (PIN) ×1 IMPLANT
PIN METAGLENE 2.5 (PIN) ×1 IMPLANT
PROTECTOR NERVE ULNAR (MISCELLANEOUS) ×2 IMPLANT
RESTRAINT HEAD UNIVERSAL NS (MISCELLANEOUS) ×2 IMPLANT
SCREW 4.5X18MM (Screw) ×2 IMPLANT
SCREW 48L (Screw) ×1 IMPLANT
SCREW BN 18X4.5XSTRL SHLDR (Screw) IMPLANT
SCREW LOCK DELTA XTEND 4.5X30 (Screw) ×1 IMPLANT
SLING ARM FOAM STRAP LRG (SOFTGOODS) ×1 IMPLANT
SMARTMIX MINI TOWER (MISCELLANEOUS)
SPIKE FLUID TRANSFER (MISCELLANEOUS) ×2 IMPLANT
SPONGE T-LAP 18X18 ~~LOC~~+RFID (SPONGE) ×2 IMPLANT
SPONGE T-LAP 4X18 ~~LOC~~+RFID (SPONGE) ×2 IMPLANT
STAPLER VISISTAT 35W (STAPLE) ×1 IMPLANT
STEM EPIPHYSIS SHOULDER (Stem) ×1 IMPLANT
STEM UNITE SZ12 (Stem) ×1 IMPLANT
STRIP CLOSURE SKIN 1/2X4 (GAUZE/BANDAGES/DRESSINGS) ×2 IMPLANT
SUCTION FRAZIER HANDLE 10FR (MISCELLANEOUS) ×2
SUCTION TUBE FRAZIER 10FR DISP (MISCELLANEOUS) ×1 IMPLANT
SUT FIBERWIRE #2 38 T-5 BLUE (SUTURE) ×10
SUT MNCRL AB 4-0 PS2 18 (SUTURE) ×2 IMPLANT
SUT VIC AB 0 CT1 36 (SUTURE) ×4 IMPLANT
SUT VIC AB 0 CT2 27 (SUTURE) ×2 IMPLANT
SUT VIC AB 2-0 CT1 27 (SUTURE) ×2
SUT VIC AB 2-0 CT1 TAPERPNT 27 (SUTURE) ×1 IMPLANT
SUTURE FIBERWR #2 38 T-5 BLUE (SUTURE) ×2 IMPLANT
TAPE PAPER 3X10 WHT MICROPORE (GAUZE/BANDAGES/DRESSINGS) ×1 IMPLANT
TOWEL OR 17X26 10 PK STRL BLUE (TOWEL DISPOSABLE) ×2 IMPLANT
TOWER SMARTMIX MINI (MISCELLANEOUS) IMPLANT

## 2022-02-18 NOTE — Progress Notes (Signed)
Pt c/o sweating episode and called rn to room. Pt color and temperature normal at time of rn arrival to room. Reassessment performed and vs assessed. Blood glucose was normal. Pt stable at time of assessment. Rn will continue to monitor.  ?

## 2022-02-18 NOTE — Anesthesia Procedure Notes (Signed)
Procedure Name: Intubation ?Date/Time: 02/18/2022 12:34 PM ?Performed by: Raenette Rover, CRNA ?Pre-anesthesia Checklist: Patient identified, Emergency Drugs available, Suction available and Patient being monitored ?Patient Re-evaluated:Patient Re-evaluated prior to induction ?Oxygen Delivery Method: Circle system utilized ?Preoxygenation: Pre-oxygenation with 100% oxygen ?Induction Type: IV induction ?Ventilation: Mask ventilation without difficulty ?Laryngoscope Size: Mac and 3 ?Grade View: Grade I ?Tube type: Oral ?Tube size: 7.0 mm ?Number of attempts: 1 ?Airway Equipment and Method: Stylet ?Placement Confirmation: ETT inserted through vocal cords under direct vision, positive ETCO2 and breath sounds checked- equal and bilateral ?Secured at: 21 cm ?Tube secured with: Tape ?Dental Injury: Teeth and Oropharynx as per pre-operative assessment  ? ? ? ? ?

## 2022-02-18 NOTE — Plan of Care (Signed)
  Problem: Clinical Measurements: Goal: Ability to maintain clinical measurements within normal limits will improve Outcome: Progressing   Problem: Activity: Goal: Risk for activity intolerance will decrease Outcome: Progressing   Problem: Pain Managment: Goal: General experience of comfort will improve Outcome: Progressing   Problem: Safety: Goal: Ability to remain free from injury will improve Outcome: Progressing   

## 2022-02-18 NOTE — Brief Op Note (Signed)
02/18/2022 ? ?2:13 PM ? ?PATIENT:  Emily Maxwell  81 y.o. female ? ?PRE-OPERATIVE DIAGNOSIS:  proximal humerus fracture, displaced, comminuted ? ?POST-OPERATIVE DIAGNOSIS:  proximal humerus fracture, displaced, comminuted ? ?PROCEDURE:  Procedure(s) with comments: ?REVERSE SHOULDER ARTHROPLASTY (Left) - with ISB with tuberosity repair ?DePuy Delta Xtend ? ?SURGEON:  Surgeon(s) and Role: ?   Netta Cedars, MD - Primary ? ?PHYSICIAN ASSISTANT:  ? ?ASSISTANTS: Ventura Bruns, PA-C  ? ?ANESTHESIA:   local, regional, and general ? ?EBL:  450 mL  ? ?BLOOD ADMINISTERED:none ? ?DRAINS: none  ? ?LOCAL MEDICATIONS USED:  MARCAINE    ? ?SPECIMEN:  No Specimen ? ?DISPOSITION OF SPECIMEN:  N/A ? ?COUNTS:  YES ? ?TOURNIQUET:  * No tourniquets in log * ? ?DICTATION: .Other Dictation: Dictation Number [id number] ? ?PLAN OF CARE: Admit for overnight observation ? ?PATIENT DISPOSITION:  PACU - hemodynamically stable. ?  ?Delay start of Pharmacological VTE agent (>24hrs) due to surgical blood loss or risk of bleeding: not applicable ? ?

## 2022-02-18 NOTE — Progress Notes (Signed)
AssistedDr. Smith Robert with left, interscalene  block. Side rails up, monitors on throughout procedure. See vital signs in flow sheet. Tolerated Procedure well. ? ?

## 2022-02-18 NOTE — Anesthesia Postprocedure Evaluation (Signed)
Anesthesia Post Note ? ?Patient: Emily Maxwell ? ?Procedure(s) Performed: REVERSE SHOULDER ARTHROPLASTY (Left: Shoulder) ? ?  ? ?Patient location during evaluation: PACU ?Anesthesia Type: General ?Level of consciousness: awake and alert ?Pain management: pain level controlled ?Vital Signs Assessment: post-procedure vital signs reviewed and stable ?Respiratory status: spontaneous breathing, nonlabored ventilation, respiratory function stable and patient connected to nasal cannula oxygen ?Cardiovascular status: blood pressure returned to baseline and stable ?Postop Assessment: no apparent nausea or vomiting ?Anesthetic complications: no ? ? ?No notable events documented. ? ?Last Vitals:  ?Vitals:  ? 02/18/22 1445 02/18/22 1500  ?BP: (!) 121/94 139/77  ?Pulse: 79   ?Resp: 14 15  ?Temp:  36.5 ?C  ?SpO2: 98% 97%  ?  ?Last Pain:  ?Vitals:  ? 02/18/22 1500  ?PainSc: 0-No pain  ? ? ?  ?  ?  ?  ?  ?  ? ?Pascuala Klutts S ? ? ? ? ?

## 2022-02-18 NOTE — Transfer of Care (Signed)
Immediate Anesthesia Transfer of Care Note ? ?Patient: Emily Maxwell ? ?Procedure(s) Performed: REVERSE SHOULDER ARTHROPLASTY (Left: Shoulder) ? ?Patient Location: PACU ? ?Anesthesia Type:GA combined with regional for post-op pain ? ?Level of Consciousness: awake ? ?Airway & Oxygen Therapy: Patient Spontanous Breathing and Patient connected to face mask oxygen ? ?Post-op Assessment: Report given to RN and Post -op Vital signs reviewed and stable ? ?Post vital signs: Reviewed and stable ? ?Last Vitals:  ?Vitals Value Taken Time  ?BP 159/81 02/18/22 1425  ?Temp    ?Pulse 84 02/18/22 1426  ?Resp 13 02/18/22 1427  ?SpO2 99 % 02/18/22 1426  ?Vitals shown include unvalidated device data. ? ?Last Pain:  ?Vitals:  ? 02/18/22 1011  ?PainSc: 6   ?   ? ?Patients Stated Pain Goal: 5 (02/18/22 1011) ? ?Complications: No notable events documented. ?

## 2022-02-18 NOTE — Progress Notes (Signed)
PHARMACIST - PHYSICIAN ORDER COMMUNICATION ? ?CONCERNING: P&T Medication Policy on Herbal Medications ? ?DESCRIPTION:  This patient?s order for:  Krill Oil, Biotin, Mag Malate, Tumeric, Barberry, Cinnamon,Glucosamine, Grape seed extract, Menaquinone  has been noted. ? ?This product(s) is classified as an ?herbal? or natural product. ?Due to a lack of definitive safety studies or FDA approval, nonstandard manufacturing practices, plus the potential risk of unknown drug-drug interactions while on inpatient medications, the Pharmacy and Therapeutics Committee does not permit the use of ?herbal? or natural products of this type within [facility]. ?  ?ACTION TAKEN: ?The pharmacy department is unable to verify this order at this time and your patient has been informed of this safety policy. ?Please reevaluate patient?s clinical condition at discharge and address if the herbal or natural product(s) should be resumed at that time. ? ?Mystery Schrupp S. Alford Highland, PharmD, BCPS ?Clinical Staff Pharmacist [email or web address] ? ?

## 2022-02-18 NOTE — Interval H&P Note (Signed)
History and Physical Interval Note: ? ?02/18/2022 ?11:47 AM ? ?Emily Maxwell  has presented today for surgery, with the diagnosis of proximal humerus fracture.  The various methods of treatment have been discussed with the patient and family. After consideration of risks, benefits and other options for treatment, the patient has consented to  Procedure(s) with comments: ?REVERSE SHOULDER ARTHROPLASTY (Left) - with ISB as a surgical intervention.  The patient's history has been reviewed, patient examined, no change in status, stable for surgery.  I have reviewed the patient's chart and labs.  Questions were answered to the patient's satisfaction.   ? ? ?Augustin Schooling ? ? ?

## 2022-02-18 NOTE — Plan of Care (Signed)
  Problem: Clinical Measurements: Goal: Respiratory complications will improve Outcome: Progressing   Problem: Clinical Measurements: Goal: Cardiovascular complication will be avoided Outcome: Progressing   Problem: Elimination: Goal: Will not experience complications related to bowel motility Outcome: Progressing   

## 2022-02-18 NOTE — Anesthesia Procedure Notes (Signed)
Anesthesia Regional Block: Interscalene brachial plexus block  ? ?Pre-Anesthetic Checklist: , timeout performed,  Correct Patient, Correct Site, Correct Laterality,  Correct Procedure, Correct Position, site marked,  Risks and benefits discussed,  Surgical consent,  Pre-op evaluation,  At surgeon's request and post-op pain management ? ?Laterality: Left ? ?Prep: chloraprep     ?  ?Needles:  ?Injection technique: Single-shot ? ?Needle Type: Echogenic Stimulator Needle   ? ? ?Needle Length: 9cm  ?Needle Gauge: 21  ? ? ? ?Additional Needles: ? ? ?Procedures:,,,, ultrasound used (permanent image in chart),,    ?Narrative:  ?Start time: 02/18/2022 11:15 AM ?End time: 02/18/2022 11:20 AM ?Injection made incrementally with aspirations every 5 mL. ? ?Performed by: Personally  ?Anesthesiologist: Effie Berkshire, MD ? ?Additional Notes: ?Patient tolerated the procedure well. Local anesthetic introduced in an incremental fashion under minimal resistance after negative aspirations. No paresthesias were elicited. After completion of the procedure, no acute issues were identified and patient continued to be monitored by RN.  ? ? ? ? ? ?

## 2022-02-18 NOTE — Anesthesia Preprocedure Evaluation (Addendum)
Anesthesia Evaluation  ?Patient identified by MRN, date of birth, ID band ?Patient awake ? ? ? ?Reviewed: ?Allergy & Precautions, NPO status , Patient's Chart, lab work & pertinent test results ? ?Airway ?Mallampati: II ? ?TM Distance: >3 FB ?Neck ROM: Full ? ? ? Dental ? ?(+) Teeth Intact, Dental Advisory Given ?  ?Pulmonary ?asthma ,  ?  ?breath sounds clear to auscultation ? ? ? ? ? ? Cardiovascular ?hypertension, Pt. on medications ?+ dysrhythmias  ?Rhythm:Regular Rate:Normal ? ? ?  ?Neuro/Psych ?Anxiety negative neurological ROS ?   ? GI/Hepatic ?Neg liver ROS, hiatal hernia,   ?Endo/Other  ?Hypothyroidism  ? Renal/GU ?Renal disease  ? ?  ?Musculoskeletal ? ?(+) Arthritis ,  ? Abdominal ?Normal abdominal exam  (+)   ?Peds ? Hematology ?negative hematology ROS ?(+)   ?Anesthesia Other Findings ? ? Reproductive/Obstetrics ? ?  ? ? ? ? ? ? ? ? ? ? ? ? ? ?  ?  ? ? ? ? ? ? ?Anesthesia Physical ?Anesthesia Plan ? ?ASA: 3 ? ?Anesthesia Plan: General  ? ?Post-op Pain Management: Regional block*  ? ?Induction: Intravenous ? ?PONV Risk Score and Plan: 4 or greater and Ondansetron and Treatment may vary due to age or medical condition ? ?Airway Management Planned: Oral ETT ? ?Additional Equipment: None ? ?Intra-op Plan:  ? ?Post-operative Plan: Extubation in OR ? ?Informed Consent: I have reviewed the patients History and Physical, chart, labs and discussed the procedure including the risks, benefits and alternatives for the proposed anesthesia with the patient or authorized representative who has indicated his/her understanding and acceptance.  ? ? ? ?Dental advisory given ? ?Plan Discussed with: CRNA ? ?Anesthesia Plan Comments:   ? ? ? ? ? ?Anesthesia Quick Evaluation ? ?

## 2022-02-18 NOTE — Discharge Instructions (Signed)
Ice to the shoulder constantly.  Keep the incision covered and clean and dry for one week, then ok to get it wet in the shower. ? ?Do gentle wrist and elbow exercise as instructed several times per day. Ok to use the hand for gentle activity in front of you.  ? ?DO NOT reach behind your back or push up out of a chair with the operative arm. Do not reach out to the side or away from your body ? ?Use a sling while you are up and around for comfort, may remove while seated.  Keep pillow propped behind the operative elbow. ? ?Follow up with Dr Veverly Fells in two weeks in the office, call [phone] for appt  ?

## 2022-02-19 ENCOUNTER — Encounter (HOSPITAL_COMMUNITY): Payer: Self-pay | Admitting: Orthopedic Surgery

## 2022-02-19 DIAGNOSIS — Z79899 Other long term (current) drug therapy: Secondary | ICD-10-CM | POA: Diagnosis not present

## 2022-02-19 DIAGNOSIS — S42352A Displaced comminuted fracture of shaft of humerus, left arm, initial encounter for closed fracture: Secondary | ICD-10-CM | POA: Diagnosis not present

## 2022-02-19 DIAGNOSIS — J45909 Unspecified asthma, uncomplicated: Secondary | ICD-10-CM | POA: Diagnosis not present

## 2022-02-19 DIAGNOSIS — Z85528 Personal history of other malignant neoplasm of kidney: Secondary | ICD-10-CM | POA: Diagnosis not present

## 2022-02-19 DIAGNOSIS — I10 Essential (primary) hypertension: Secondary | ICD-10-CM | POA: Diagnosis not present

## 2022-02-19 DIAGNOSIS — E039 Hypothyroidism, unspecified: Secondary | ICD-10-CM | POA: Diagnosis not present

## 2022-02-19 LAB — BASIC METABOLIC PANEL
Anion gap: 8 (ref 5–15)
BUN: 14 mg/dL (ref 8–23)
CO2: 24 mmol/L (ref 22–32)
Calcium: 8.3 mg/dL — ABNORMAL LOW (ref 8.9–10.3)
Chloride: 101 mmol/L (ref 98–111)
Creatinine, Ser: 0.96 mg/dL (ref 0.44–1.00)
GFR, Estimated: 60 mL/min — ABNORMAL LOW (ref >60)
Glucose, Bld: 154 mg/dL — ABNORMAL HIGH (ref 70–99)
Potassium: 3.8 mmol/L (ref 3.5–5.1)
Sodium: 133 mmol/L — ABNORMAL LOW (ref 135–145)

## 2022-02-19 LAB — HEMOGLOBIN AND HEMATOCRIT, BLOOD
HCT: 32.1 % — ABNORMAL LOW (ref 36.0–46.0)
Hemoglobin: 10.9 g/dL — ABNORMAL LOW (ref 12.0–15.0)

## 2022-02-19 NOTE — Progress Notes (Signed)
The patient is alert and oriented and has been seen by her physician. The orders for discharge were written. IV has been removed. Went over discharge instructions with patient and family. She is being discharged via wheelchair with all of her belongings.  

## 2022-02-19 NOTE — Op Note (Signed)
NAME: Emily Maxwell, Emily Maxwell ?MEDICAL RECORD NO: [id number] ?ACCOUNT NO: [id number] ?DATE OF BIRTH: [birth year] ?FACILITY: [facility] ?LOCATION: [facility] ?PHYSICIAN: Doran Heater. Veverly Fells, MD ? ?Operative Report  ? ?DATE OF PROCEDURE: 02/18/2022 ? ?PREOPERATIVE DIAGNOSIS:  Left displaced and comminuted proximal humerus fracture. ? ?POSTOPERATIVE DIAGNOSIS:  Left displaced and comminuted proximal humerus fracture. ? ?PROCEDURE PERFORMED:  Left reverse total shoulder replacement using DePuy Delta Xtend prosthesis with tuberosity repair. ? ?ATTENDING SURGEON:  Doran Heater. Veverly Fells, MD ? ?ASSISTANT:  Darol Destine, MD, PA-C, who was scrubbed during the entire procedure and necessary for satisfactory completion of surgery. ? ?ANESTHESIA:  General anesthesia was used plus interscalene block. ? ?ESTIMATED BLOOD LOSS:  200 mL ? ?FLUID REPLACEMENT:  1500 mL crystalloid. ? ?COUNT:  Instrument counts correct. ? ?COMPLICATIONS:  No complications. ? ?Perioperative antibiotics were given. ? ?INDICATIONS:  The patient is an 81 year old female who suffered a hard fall, injuring her left shoulder.  The patient presented to orthopedics with a displaced comminuted fracture of her proximal humerus with a head split component. Given the extreme  ?displacement of her fragments and the splitting of her head, we recommended reverse shoulder replacement to restore fixed focal mechanics to the shoulder, improving quality of life and function, and maintaining independence.  The patient agreed.   ?Informed consent obtained. ? ?DESCRIPTION OF PROCEDURE:  After an adequate level of anesthesia was achieved, the patient was positioned in the modified beach chair position.  Left shoulder correctly identified and sterilely prepped and draped in the usual manner.  Timeout called,  ?verifying correct patient, correct site. We entered the patient's shoulder using standard deltopectoral approach, starting at the coracoid process extending down to the anterior humerus.  Dissection  carried out down through subcutaneous tissues using  ?Bovie and again, we verified our timeout with correct patient, correct site prior to incision.  After dissection down through subcutaneous tissues, we identified the cephalic vein and took that laterally with the deltoid, pectoralis taken medially.   ?Conjoined tendon identified and retracted medially.  Deep retractors placed.  Biceps tenodesed in situ with 0 Vicryl figure-of-eight suture.  We then open up the biceps sheath.  We osteotomized the lesser tuberosity off of the proximal humerus fracture  ?fragments and then used a rongeur to debulk the tuberosity. We placed two #2 FiberWire sutures medial to the lesser tuberosity in the subscapularis tendon in a mattress fashion with the sutures coming out superficially.  We then debulked the greater  ?tuberosity, removed the head, which was popped out. The head was in 2 pieces and once we debulked the greater tuberosity, we placed #2 FiberWire suture x3 lateral to the greater tuberosity in the rotator cuff tendon.  Once we had tuberosity control and  ?the head fragments removed, we obtained bone graft from the head fragments and kept on the back table for later in the surgery.  We then gained exposure of the glenoid face.  We did just enough capsular release to get our retractors placed.  We then  ?removed the cartilage and the labrum off the glenoid.  We placed our guide pin for our metaglene baseplate reaming.  We then reamed for the metaglene baseplate down to subchondral bone.  We drilled out our central peg hole.  We did our peripheral hand  ?reaming and then with preparation complete and irrigation done, we went ahead and impacted the metaglene baseplate into position, referencing off the 6 o'clock position on the inferior scapular neck.  We then placed a 48 screw  inferiorly, a 30 screw  ?superiorly and an 18 screw anteriorly.  We locked all the screws down except the anterior screw, which was nonlocked, but  had good purchase.  We then placed a 38 standard glenosphere onto the baseplate and secured that with a screwdriver.  We did a  ?finger sweep around the glenosphere to make sure there was no soft tissue caught up into the bearing.  At this point, we began to ream the humeral side.  We delivered the humerus out of the wound, placed our retractors.  We then reamed up to a size 12.   ?We then trialled with the 12 stem with the fracture epi centered and once we had that done, we placed that about 10 degrees of retroversion and impacted, it was stable and then we placed a 38+3 poly trial onto the humeral tray, reduced the shoulder.  We  ?felt like that was just about perfect in terms of tension and stability.  The tuberosities were reducible.  We then removed the trial components.  We irrigated thoroughly. Drilled two holes in the shaft and placed #2 FiberWire suture x2 with suture limbs ? coming out anteriorly for assistance with the tuberosity repair at the end.  We then irrigated again and then used available bone graft from the humeral head and impaction grafting technique and used the size 10 Por[place]oat Delta stem with the size 1  ?fracture epi set on the 0 setting and we impacted that in 10 degrees of retroversion with good stem stability.  We then reduced the shoulder with the trial 38+3. Felt like it would be a little bit tighter with a 38+6, so we selected the real 38+6 poly,  ?impacted on the humeral tray, reduced the shoulder.  Excellent balance and tension.  We bone grafted extensively around the [place] proximal part of the stem and then used our around-the-world suture that went through the stem, it went medial to the  ?lesser tuberosity, lateral to the greater tuberosity.  We reduced the tuberosities and tied tuberosity-to-tuberosity sutures, pushing down and making sure that we had good compression.  We then did around-the-world stitch.  We also did 2 rotator interval ? sutures and then we had the shaft  sutures going up medial to the lesser tuberosity and lateral to the greater tuberosity to secure the tuberosities to the shaft.  Everything moved together as a unit, had a nice closure.  It did not restrict range of  ?motion at all.  We had excellent range of motion and stability.  We irrigated thoroughly.  We then repaired the deltopectoral interval with 0 Vicryl suture followed by 2-0 Vicryl for subcutaneous closure and 4-0 Monocryl for skin.  Sterile dressing  ?applied, followed by a shoulder sling.  The patient transported to recovery room in stable condition. ? ? ?VAI ?D: 02/18/2022 2:20:28 pm T: 02/19/2022 2:39:00 am  ?JOB: [id number]/ [id number]  ?

## 2022-02-19 NOTE — Progress Notes (Signed)
Transition of Care (TOC) Screening Note ? ?Patient Details  ?Name: Emily Maxwell ?Date of Birth: 07-02-1941 ? ?Transition of Care (TOC) CM/SW Contact:    ?Sherie Don, LCSW ?Phone Number: ?02/19/2022, 8:54 AM ? ?Transition of Care Department [facility]) has reviewed patient and no TOC needs have been identified at this time. We will continue to monitor patient advancement through interdisciplinary progression rounds. If new patient transition needs arise, please place a TOC consult. ?

## 2022-02-19 NOTE — Evaluation (Signed)
Occupational Therapy Evaluation ?Patient Details ?Name: Emily Maxwell ?MRN: [id number] ?DOB: Apr 24, 1941 ?Today's Date: 02/19/2022 ? ? ?History of Present Illness Patient is an 81 year old female had a fall sustaining L humerus fracture, s/p left reverse total shoulder arthroplasty. PMH: kidney cancer, wrist surgery  ? ?Clinical Impression ?  ?Patient is an 81 year old female s/p shoulder replacement without functional use of left non-dominant upper extremity secondary to effects of surgery and interscalene block and shoulder precautions. Therapist provided education and instruction to patient and family in regards to exercises, precautions, positioning, donning upper extremity clothing and bathing while maintaining shoulder precautions, ice and edema management and donning/doffing sling. Patient and family verbalized understanding and demonstrated as needed. Patient needed assistance to donn shirt and provided with instruction on compensatory strategies to perform ADLs. Patient to follow up with MD for further therapy needs.  ?  ?   ? ?Recommendations for follow up therapy are one component of a multi-disciplinary discharge planning process, led by the attending physician.  Recommendations may be updated based on patient status, additional functional criteria and insurance authorization.  ? ?Follow Up Recommendations ? Follow physician's recommendations for discharge plan and follow up therapies  ?  ?Assistance Recommended at Discharge PRN  ?Patient can return home with the following A little help with bathing/dressing/bathroom ? ?  ?Functional Status Assessment ? Patient has had a recent decline in their functional status and demonstrates the ability to make significant improvements in function in a reasonable and predictable amount of time.  ?Equipment Recommendations ? None recommended by OT  ?  ?   ?Precautions / Restrictions Precautions ?Precautions: Shoulder ?Type of Shoulder Precautions: AROM elbow, wrist, hand  ok. A/PROM shoulder NO ?Shoulder Interventions: Shoulder sling/immobilizer;Off for dressing/bathing/exercises ?Precaution Booklet Issued: Yes (comment) ?Required Braces or Orthoses: Sling ?Restrictions ?Weight Bearing Restrictions: Yes ?LUE Weight Bearing: Non weight bearing  ? ?  ? ?Mobility Bed Mobility ?  ?  ?  ?  ?  ?  ?  ?General bed mobility comments: Patient in recliner upon arrival ?  ? ?Transfers ?Overall transfer level: Independent ?Equipment used: None ?  ?  ?  ?  ?  ?  ?  ?  ?  ? ?  ?Balance Overall balance assessment: Independent, History of Falls ?  ?  ?  ?  ?  ?  ?  ?  ?  ?  ?  ?  ?  ?  ?  ?  ?  ?  ?   ? ?ADL either performed or assessed with clinical judgement  ? ?ADL Overall ADL's : Needs assistance/impaired ?Eating/Feeding: Independent ?  ?Grooming: Independent ?  ?Upper Body Bathing: Minimal assistance;Sitting;Standing ?  ?Lower Body Bathing: Independent ?  ?Upper Body Dressing : Minimal assistance;Sitting;Standing ?Upper Body Dressing Details (indicate cue type and reason): Patient needing assistance to thread L UE into sleeve. Patient familiar with sequencing as she was doing this at home prior to surgery ?Lower Body Dressing: Independent ?Lower Body Dressing Details (indicate cue type and reason): Patient with pants and shoes on prior to OT arrival ?Toilet Transfer: Independent ?  ?Toileting- Clothing Manipulation and Hygiene: Independent ?  ?  ?  ?Functional mobility during ADLs: Independent ?General ADL Comments: Patient and family educated in shoulder precautions and how to maintain during shoulder precautions  ? ? ? ? ?Pertinent Vitals/Pain Pain Assessment ?Pain Assessment: Faces ?Faces Pain Scale: Hurts little more ?Pain Location: L UE ?Pain Descriptors / Indicators: Heaviness, Numbness ?Pain Intervention(s): Monitored  during session  ? ? ? ?Hand Dominance Right ?  ?Extremity/Trunk Assessment Upper Extremity Assessment ?Upper Extremity Assessment: LUE deficits/detail ?LUE Deficits /  Details: + nerve block, able to flex/extend digits WFL ?  ?Lower Extremity Assessment ?Lower Extremity Assessment: Overall WFL for tasks assessed ?  ?Cervical / Trunk Assessment ?Cervical / Trunk Assessment: Normal ?  ?Communication Communication ?Communication: No difficulties ?  ?Cognition Arousal/Alertness: Awake/alert ?Behavior During Therapy: [facility] for tasks assessed/performed ?Overall Cognitive Status: Within Functional Limits for tasks assessed ?  ?  ?  ?  ?  ?  ?  ?  ?  ?  ?  ?  ?  ?  ?  ?  ?  ?  ?  ?   ?Exercises Exercises: Shoulder ?  ?Shoulder Instructions Shoulder Instructions ?Donning/doffing shirt without moving shoulder: Minimal assistance;Patient able to independently direct caregiver;Caregiver independent with task ?Method for sponge bathing under operated UE: Minimal assistance;Caregiver independent with task;Patient able to independently direct caregiver ?Donning/doffing sling/immobilizer: Maximal assistance;Caregiver independent with task;Patient able to independently direct caregiver ?Correct positioning of sling/immobilizer: Minimal assistance;Caregiver independent with task;Patient able to independently direct caregiver ?ROM for elbow, wrist and digits of operated UE: Caregiver independent with task;Patient able to independently direct caregiver ?Sling wearing schedule (on at all times/off for ADL's): Caregiver independent with task;Patient able to independently direct caregiver ?Proper positioning of operated UE when showering: Caregiver independent with task;Patient able to independently direct caregiver ?Positioning of UE while sleeping: Caregiver independent with task;Patient able to independently direct caregiver  ? ? ?Home Living Family/patient expects to be discharged to:: Private residence ?Living Arrangements: Spouse/significant other;Children ?  ?  ?  ?  ?  ?  ?  ?  ?  ?  ?  ?  ?  ?  ?  ?  ?  ? ?  ?Prior Functioning/Environment Prior Level of Function : Independent/Modified  Independent ?  ?  ?  ?  ?  ?  ?  ?  ?  ? ?  ?  ?OT Problem List: Pain;Impaired UE functional use;Decreased knowledge of precautions ?  ?   ?   ?OT Goals(Current goals can be found in the care plan section) Acute Rehab OT Goals ?Patient Stated Goal: Home today ?OT Goal Formulation: All assessment and education complete, DC therapy  ? ?AM-PAC OT "6 Clicks" Daily Activity     ?Outcome Measure Help from another person eating meals?: None ?Help from another person taking care of personal grooming?: None ?Help from another person toileting, which includes using toliet, bedpan, or urinal?: None ?Help from another person bathing (including washing, rinsing, drying)?: A Little ?Help from another person to put on and taking off regular upper body clothing?: A Little ?Help from another person to put on and taking off regular lower body clothing?: None ?6 Click Score: 22 ?  ?End of Session Equipment Utilized During Treatment: Other (comment) (sling) ?Nurse Communication: Other (comment) (OT complete) ? ?Activity Tolerance: Patient tolerated treatment well ?Patient left: in chair;with call bell/phone within reach;with family/visitor present ? ?OT Visit Diagnosis: Pain;History of falling (Z91.81) ?Pain - Right/Left: Left ?Pain - part of body: Shoulder  ?              ?Time: 9150-5697 ?OT Time Calculation (min): 19 min ?Charges:  OT General Charges ?$OT Visit: 1 Visit ?OT Evaluation ?$OT Eval Low Complexity: 1 Low ? ?Delbert Phenix OT ?OT pager: [phone] ? ?Rosemary Holms ?02/19/2022, 11:31 AM ?

## 2022-02-19 NOTE — Progress Notes (Signed)
Orthopedics Progress Note ? ?Subjective: ?No pain this morning, feeling well ? ?Objective: ? ?Vitals:  ? 02/19/22 0159 02/19/22 0533  ?BP: 111/61 111/73  ?Pulse: 93 69  ?Resp: 17 17  ?Temp: 98.2 ?F (36.8 ?C) 98.2 ?F (36.8 ?C)  ?SpO2: 97% 99%  ? ? ?General: Awake and alert  ?Musculoskeletal: Left shoulder incision CDI, no drainage, minimal swelling ?Neurovascularly intact ? ?Lab Results  ?Component Value Date  ? WBC 6.9 02/18/2022  ? HGB 10.9 (L) 02/19/2022  ? HCT 32.1 (L) 02/19/2022  ? MCV 91.1 02/18/2022  ? PLT 188 02/18/2022  ? ? ?   ?Component Value Date/Time  ? NA 133 (L) 02/19/2022 0340  ? K 3.8 02/19/2022 0340  ? CL 101 02/19/2022 0340  ? CO2 24 02/19/2022 0340  ? GLUCOSE 154 (H) 02/19/2022 0340  ? BUN 14 02/19/2022 0340  ? CREATININE 0.96 02/19/2022 0340  ? CALCIUM 8.3 (L) 02/19/2022 0340  ? GFRNONAA 60 (L) 02/19/2022 0340  ? GFRAA 49 (L) 09/21/2015 0540  ? ? ?No results found for: INR, PROTIME ? ?Assessment/Plan: ?POD #1 s/p Procedure(s): ?REVERSE SHOULDER ARTHROPLASTY ?Stable s/p reverse TSA for fracture. ?Home after therapy ?Follow up in two weeks in the office ? ?Doran Heater. Veverly Fells, MD ?02/19/2022 ?7:51 AM ? ?  ?

## 2022-02-19 NOTE — Discharge Summary (Signed)
? ?In most cases prophylactic antibiotics for Dental procdeures after total joint surgery are not necessary. ? ?Exceptions are as follows: ? ?1. History of prior total joint infection ? ?2. Severely immunocompromised (Organ Transplant, cancer chemotherapy, Rheumatoid biologic ?meds such as [place]) ? ?3. Poorly controlled diabetes (A1C &gt; 8.0, blood glucose over 200) ? ?If you have one of these conditions, contact your surgeon for an antibiotic prescription, prior to your ?dental procedure. Orthopedic Discharge Summary ? ? ? ?  ? ? ?Physician Discharge Summary  ?Patient ID: ?Emily Maxwell ?MRN: [id number] ?DOB/AGE: 1941/03/20 81 y.o. ? ?Admit date: 02/18/2022 ?Discharge date: 02/19/2022  ? ?Procedures:  ?Procedure(s) (LRB): ?REVERSE SHOULDER ARTHROPLASTY (Left) ? ?Attending Physician:  Dr. Esmond Plants ? ?Admission Diagnoses:   left shoulder displaced proximal humerus fracture ? ?Discharge Diagnoses:  same ? ? ?Past Medical History:  ?Diagnosis Date  ? Aneurysm, ascending aorta ([place]) 2013  ? 21m  ? Anxiety   ? hx of  anxiety , had stress test several years ago per patient that was normal   ? Arthritis   ? thumbs , oa  ? Ascending aorta dilation ([facility]) 08/07/2012  ? Asthma   ? Cancer ([facility]   ? cancer left kidney  ? [place] [facility] [place]   ? Had right kidney removed  ? Complication of anesthesia   ? pulse rate slow in past, did ok with recent anesthesia  ? Deaf   ? left ear  ? Dysrhythmia   ? occasional arrythmia, no meds taken for  ? Hematuria   ? no blood seen recently  ? Hematuria   ? History of fatty infiltration of liver   ? History of hiatal hernia   ? Hyperlipidemia   ? Hyperplastic colon polyp   ? Hypertension   ? Hypothyroidism   ? IFG (impaired fasting glucose)   ? Nodule of left lung   ? stable, surveillance  Dr SBrigitte Pulse ? Numbness   ? left thigh, when walking or standing for long periods of time  ? Osteopenia   ? RLS (restless legs syndrome)   ? Rosacea   ? Simple endometrial hyperplasia years ago  ? [place] [place]   ? stable per last chest ct 07-11-14 [facility] on chart-   ? Vasovagal reaction   ? hx of, triggers after eating once you have fasted  ? ? ?PCP: SGinger Organ, MD  ? ?Discharged Condition: good ? ?Hospital Course:  Patient underwent the above stated procedure on 02/18/2022. Patient tolerated the procedure well and brought to the recovery room in good condition and subsequently to the floor. Patient had an uncomplicated hospital course and was stable for discharge. ? ? ?Disposition: Discharge disposition: 01-Home or Self Care ? ? ? ? ? with follow up in 2 weeks ? ? ? Follow-up Information   ? ? NNetta Cedars MD. Call in 2 week(s).   ?Specialty: Orthopedic Surgery ?Why: call [phone] for follow up appt in two weeks with Dr NVeverly Fells?Contact information: ?[address] ?[phone]? ? ?  ?  ? ?  ?  ? ?  ? ? ?Dental Antibiotics: ? ?In most cases prophylactic antibiotics for Dental procdeures after total joint surgery are not necessary. ? ?Exceptions are as follows: ? ?1. History of prior total joint infection ? ?2. Severely immunocompromised (Organ Transplant, cancer chemotherapy, Rheumatoid biologic ?meds such as [place] ? ?3. Poorly controlled diabetes (A1C &gt; 8.0, blood glucose over  200) ? ?If you have one of these conditions, contact your surgeon for an antibiotic prescription, prior to your ?dental procedure. ? ?Discharge Instructions   ? ? Call MD / Call 911   Complete by: As directed ?  ? If you experience chest pain or shortness of breath, CALL 911 and be transported to the hospital emergency room.  If you develope a fever above 101 F, pus (white drainage) or increased drainage or redness at the wound, or calf pain, call your surgeon's office.  ? Constipation Prevention   Complete by: As directed ?  ? Drink plenty of fluids.  Prune juice may be helpful.  You may use a stool softener, such as Colace (over the counter) 100 mg twice a day.   Use MiraLax (over the counter) for constipation as needed.  ? Diet - low sodium heart healthy   Complete by: As directed ?  ? Increase activity slowly as tolerated   Complete by: As directed ?  ? Post-operative opioid taper instructions:   Complete by: As directed ?  ? POST-OPERATIVE OPIOID TAPER INSTRUCTIONS: ?It is important to wean off of your opioid medication as soon as possible. If you do not need pain medication after your surgery it is ok to stop day one. ?Opioids include: ?Codeine, Hydrocodone(Norco, Vicodin), Oxycodone(Percocet, oxycontin) and hydromorphone amongst others.  ?Long term and even short term use of opiods can cause: ?Increased pain response ?Dependence ?Constipation ?Depression ?Respiratory depression ?And more.  ?Withdrawal symptoms can include ?Flu like symptoms ?Nausea, vomiting ?And more ?Techniques to manage these symptoms ?Hydrate well ?Eat regular healthy meals ?Stay active ?Use relaxation techniques(deep breathing, meditating, yoga) ?Do Not substitute Alcohol to help with tapering ?If you have been on opioids for less than two weeks and do not have pain than it is ok to stop all together.  ?Plan to wean off of opioids ?This plan should start within one week post op of your joint replacement. ?Maintain the same interval or time between taking each dose and first decrease the dose.  ?Cut the total daily intake of opioids by one tablet each day ?Next start to increase the time between doses. ?The last dose that should be eliminated is the evening dose.  ? ?  ? ?  ? ? ?Allergies as of 02/19/2022   ? ?   Reactions  ? Ketorolac Tromethamine Palpitations  ? Avelox [moxifloxacin Hcl In Nacl] Other (See Comments)  ? Unable to describe reaction  ? Epinephrine Other (See Comments)  ? Makes heart rate increase and cold sweats   ? Tramadol Other (See Comments)  ? Unable to describe reaction  ? Penicillins Rash  ? Childhood rxn from weekly PCN shots w/ asthma Tx ?Has patient had a PCN reaction causing  immediate rash, facial/tongue/throat swelling, SOB or lightheadedness with hypotension: unknown ?Has patient had a PCN reaction causing severe rash involving mucus membranes or skin necrosis: unknown ?Has patient had a PCN reaction that required hospitalization: no   ?Has patient had a PCN reaction occurring within the last 10 years: no ?If all of the above answers are "NO", then may proceed with Cephalosporin  ? Sulfa Antibiotics Rash  ? Tolerates non-antibiotic sulfa drugs  ? ?  ? ?  ?Medication List  ?  ? ?TAKE these medications   ? ?acetaminophen 500 MG tablet ?Commonly known as: TYLENOL ?Take 500-1,000 mg by mouth every 6 (six) hours as needed (pain.). ?  ?ADULT ONE DAILY GUMMIES PO ?Take 2 tablets by mouth  in the morning. ?  ?Align Chew ?Chew 2 tablets by mouth in the morning. Gummy ?  ?augmented betamethasone dipropionate 0.05 % cream ?Commonly known as: DIPROLENE-AF ?Apply 1 application. topically 2 (two) times daily as needed. ?  ?BENEFIBER PO ?Take 0.5 packets by mouth in the morning and at bedtime. ?  ?BERBERINE COMPLEX PO ?Take 2 capsules by mouth in the morning. Berberine 450 mg/capsule ?  ?BIOTIN PO ?Take 1 tablet by mouth in the morning. ?  ?CINNAMON PO ?Take 1 capsule by mouth in the morning. ?  ?COSAMIN DS PO ?Take 1 tablet by mouth in the morning. Move Free Ultra ?  ?docusate sodium 100 MG capsule ?Commonly known as: COLACE ?Take 100 mg by mouth daily as needed (constipation.). ?  ?doxycycline 100 MG tablet ?Commonly known as: VIBRA-TABS ?Take 100 mg by mouth daily. ?  ?EPINEPHrine 0.3 mg/0.3 mL Soaj injection ?Commonly known as: EPI-PEN ?Inject 0.3 mg into the muscle as needed for anaphylaxis. As needed for life-threatening allergic reactions ?  ?fexofenadine 180 MG tablet ?Commonly known as: ALLEGRA ?Take 180 mg by mouth daily as needed for allergies. ?  ?FISH OIL PO ?Take 1 capsule by mouth in the morning. ?  ?FOLIC ACID PO ?Take 1 tablet by mouth in the morning. ?  ?gabapentin 100 MG  capsule ?Commonly known as: NEURONTIN ?Take 100 mg by mouth daily as needed (restless leg syndrome). ?  ?GRAPESEED EXTRACT PO ?Take 1 capsule by mouth in the morning. ?  ?guaiFENesin 600 MG 12 hr tablet ?Commonl

## 2022-02-19 NOTE — Plan of Care (Signed)
  Problem: Education: Goal: Knowledge of General Education information will improve Description Including pain rating scale, medication(s)/side effects and non-pharmacologic comfort measures Outcome: Progressing   Problem: Health Behavior/Discharge Planning: Goal: Ability to manage health-related needs will improve Outcome: Progressing   

## 2022-03-03 DIAGNOSIS — Z4789 Encounter for other orthopedic aftercare: Secondary | ICD-10-CM | POA: Diagnosis not present

## 2022-03-09 ENCOUNTER — Ambulatory Visit: Payer: Medicare Other | Admitting: Allergy and Immunology

## 2022-03-11 NOTE — Progress Notes (Signed)
Reviewed and agree with documentation and assessment and plan. K. Veena Willliam Pettet , MD   

## 2022-04-01 DIAGNOSIS — Z4789 Encounter for other orthopedic aftercare: Secondary | ICD-10-CM | POA: Diagnosis not present

## 2022-04-08 DIAGNOSIS — M25612 Stiffness of left shoulder, not elsewhere classified: Secondary | ICD-10-CM | POA: Diagnosis not present

## 2022-04-08 DIAGNOSIS — M25512 Pain in left shoulder: Secondary | ICD-10-CM | POA: Diagnosis not present

## 2022-04-12 DIAGNOSIS — M25512 Pain in left shoulder: Secondary | ICD-10-CM | POA: Diagnosis not present

## 2022-04-12 DIAGNOSIS — M25612 Stiffness of left shoulder, not elsewhere classified: Secondary | ICD-10-CM | POA: Diagnosis not present

## 2022-04-16 DIAGNOSIS — M25512 Pain in left shoulder: Secondary | ICD-10-CM | POA: Diagnosis not present

## 2022-04-16 DIAGNOSIS — M25612 Stiffness of left shoulder, not elsewhere classified: Secondary | ICD-10-CM | POA: Diagnosis not present

## 2022-04-22 DIAGNOSIS — M25512 Pain in left shoulder: Secondary | ICD-10-CM | POA: Diagnosis not present

## 2022-04-22 DIAGNOSIS — M25612 Stiffness of left shoulder, not elsewhere classified: Secondary | ICD-10-CM | POA: Diagnosis not present

## 2022-04-27 DIAGNOSIS — M25612 Stiffness of left shoulder, not elsewhere classified: Secondary | ICD-10-CM | POA: Diagnosis not present

## 2022-04-27 DIAGNOSIS — M25512 Pain in left shoulder: Secondary | ICD-10-CM | POA: Diagnosis not present

## 2022-04-29 DIAGNOSIS — M25612 Stiffness of left shoulder, not elsewhere classified: Secondary | ICD-10-CM | POA: Diagnosis not present

## 2022-04-29 DIAGNOSIS — M25512 Pain in left shoulder: Secondary | ICD-10-CM | POA: Diagnosis not present

## 2022-05-03 DIAGNOSIS — M25512 Pain in left shoulder: Secondary | ICD-10-CM | POA: Diagnosis not present

## 2022-05-03 DIAGNOSIS — M25612 Stiffness of left shoulder, not elsewhere classified: Secondary | ICD-10-CM | POA: Diagnosis not present

## 2022-05-07 DIAGNOSIS — M25512 Pain in left shoulder: Secondary | ICD-10-CM | POA: Diagnosis not present

## 2022-05-07 DIAGNOSIS — M25612 Stiffness of left shoulder, not elsewhere classified: Secondary | ICD-10-CM | POA: Diagnosis not present

## 2022-05-12 DIAGNOSIS — I10 Essential (primary) hypertension: Secondary | ICD-10-CM | POA: Diagnosis not present

## 2022-05-12 DIAGNOSIS — M25612 Stiffness of left shoulder, not elsewhere classified: Secondary | ICD-10-CM | POA: Diagnosis not present

## 2022-05-12 DIAGNOSIS — N1831 Chronic kidney disease, stage 3a: Secondary | ICD-10-CM | POA: Diagnosis not present

## 2022-05-12 DIAGNOSIS — M810 Age-related osteoporosis without current pathological fracture: Secondary | ICD-10-CM | POA: Diagnosis not present

## 2022-05-12 DIAGNOSIS — M25512 Pain in left shoulder: Secondary | ICD-10-CM | POA: Diagnosis not present

## 2022-05-13 DIAGNOSIS — Z4789 Encounter for other orthopedic aftercare: Secondary | ICD-10-CM | POA: Diagnosis not present

## 2022-05-17 DIAGNOSIS — M25612 Stiffness of left shoulder, not elsewhere classified: Secondary | ICD-10-CM | POA: Diagnosis not present

## 2022-05-19 DIAGNOSIS — H6122 Impacted cerumen, left ear: Secondary | ICD-10-CM | POA: Diagnosis not present

## 2022-05-19 DIAGNOSIS — H838X2 Other specified diseases of left inner ear: Secondary | ICD-10-CM | POA: Diagnosis not present

## 2022-05-19 DIAGNOSIS — H9042 Sensorineural hearing loss, unilateral, left ear, with unrestricted hearing on the contralateral side: Secondary | ICD-10-CM | POA: Diagnosis not present

## 2022-05-19 DIAGNOSIS — H6982 Other specified disorders of Eustachian tube, left ear: Secondary | ICD-10-CM | POA: Diagnosis not present

## 2022-05-21 IMAGING — DX DG HUMERUS 2V *L*
2 series · 2 of 2 positions shown · non-contrast
Comparison: None Available.

CLINICAL DATA: Fall.  Left upper arm pain.

EXAM:
LEFT HUMERUS - 2+ VIEW

[humerus ap]
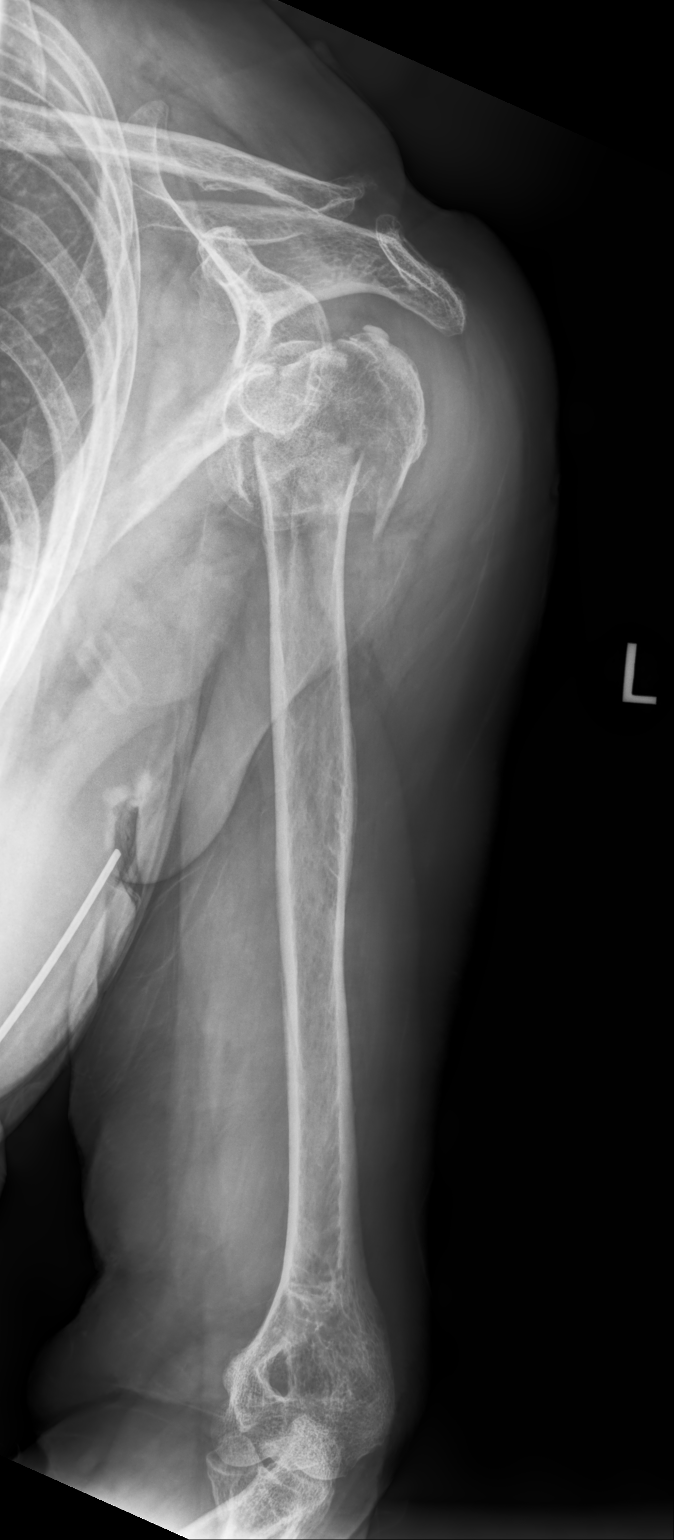

[humerus lat]
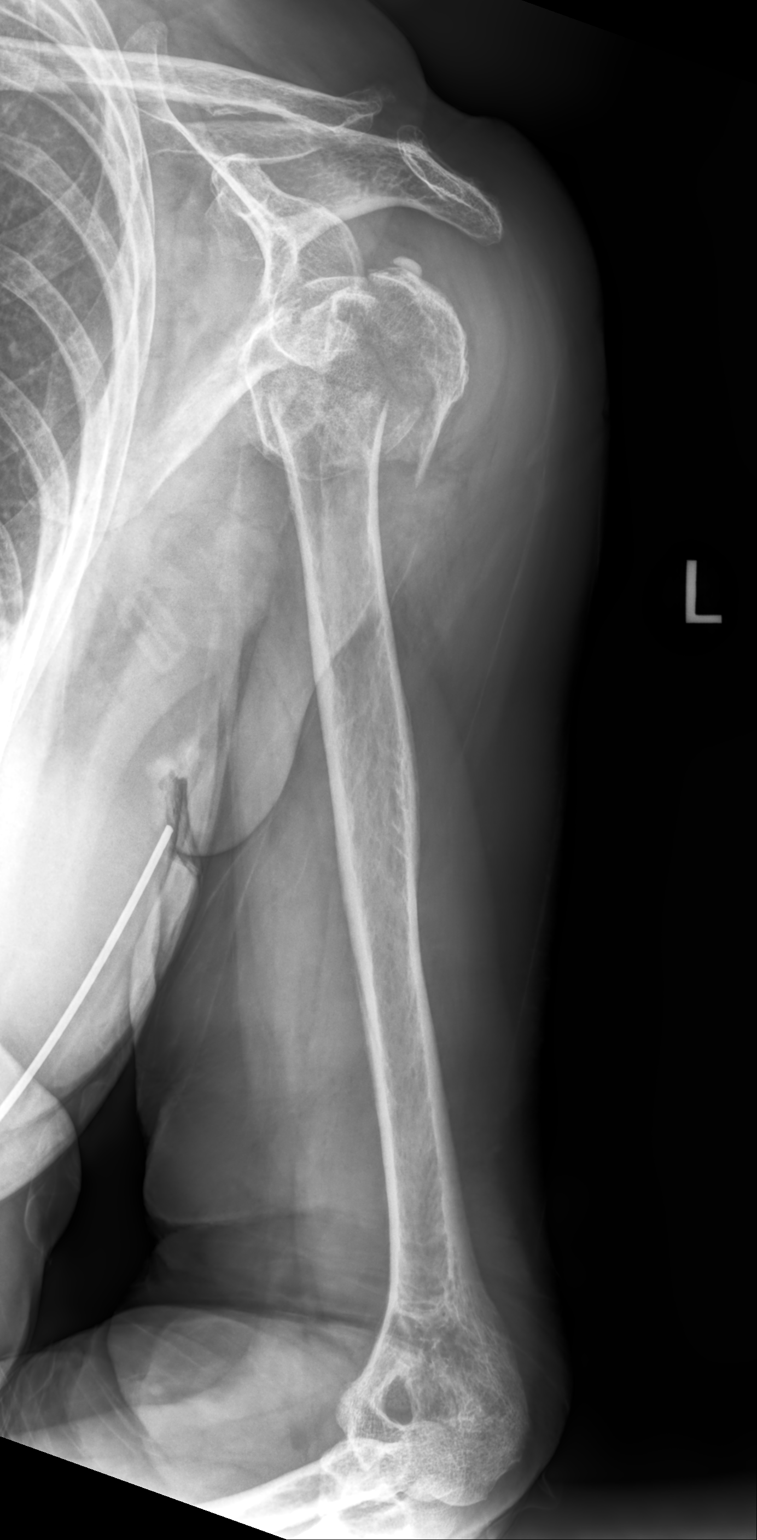

[2 of 2 positions shown; findings below may reference images not displayed]

FINDINGS: Comminuted fracture is seen involving the left humeral head and
neck. No evidence of distal humerus fracture. Generalized osteopenia
noted.
IMPRESSION: Comminuted fracture of left humeral head and neck.

## 2022-05-21 IMAGING — DX DG SHOULDER 2+V*L*
2 series · 2 of 2 positions shown · non-contrast
Comparison: None Available.

CLINICAL DATA: Tripped and fell.  Left shoulder injury and pain.

EXAM:
LEFT SHOULDER - 2+ VIEW

[shoulder neutral ap]
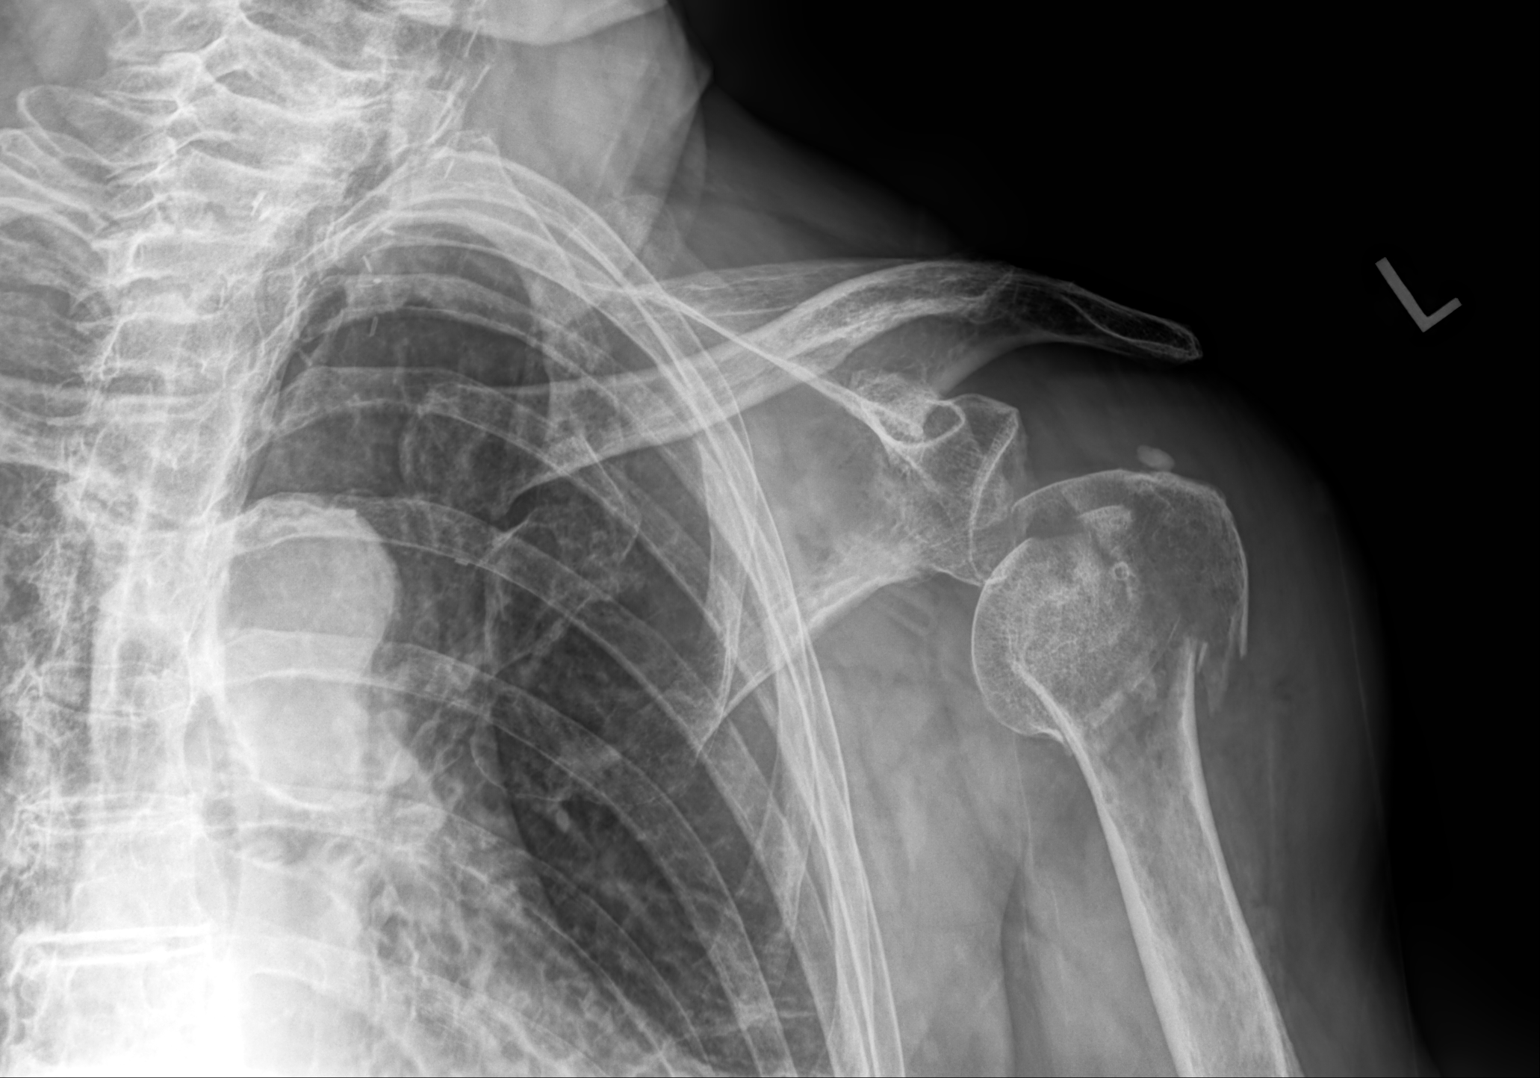

[shoulder transscapular y view (neer)]
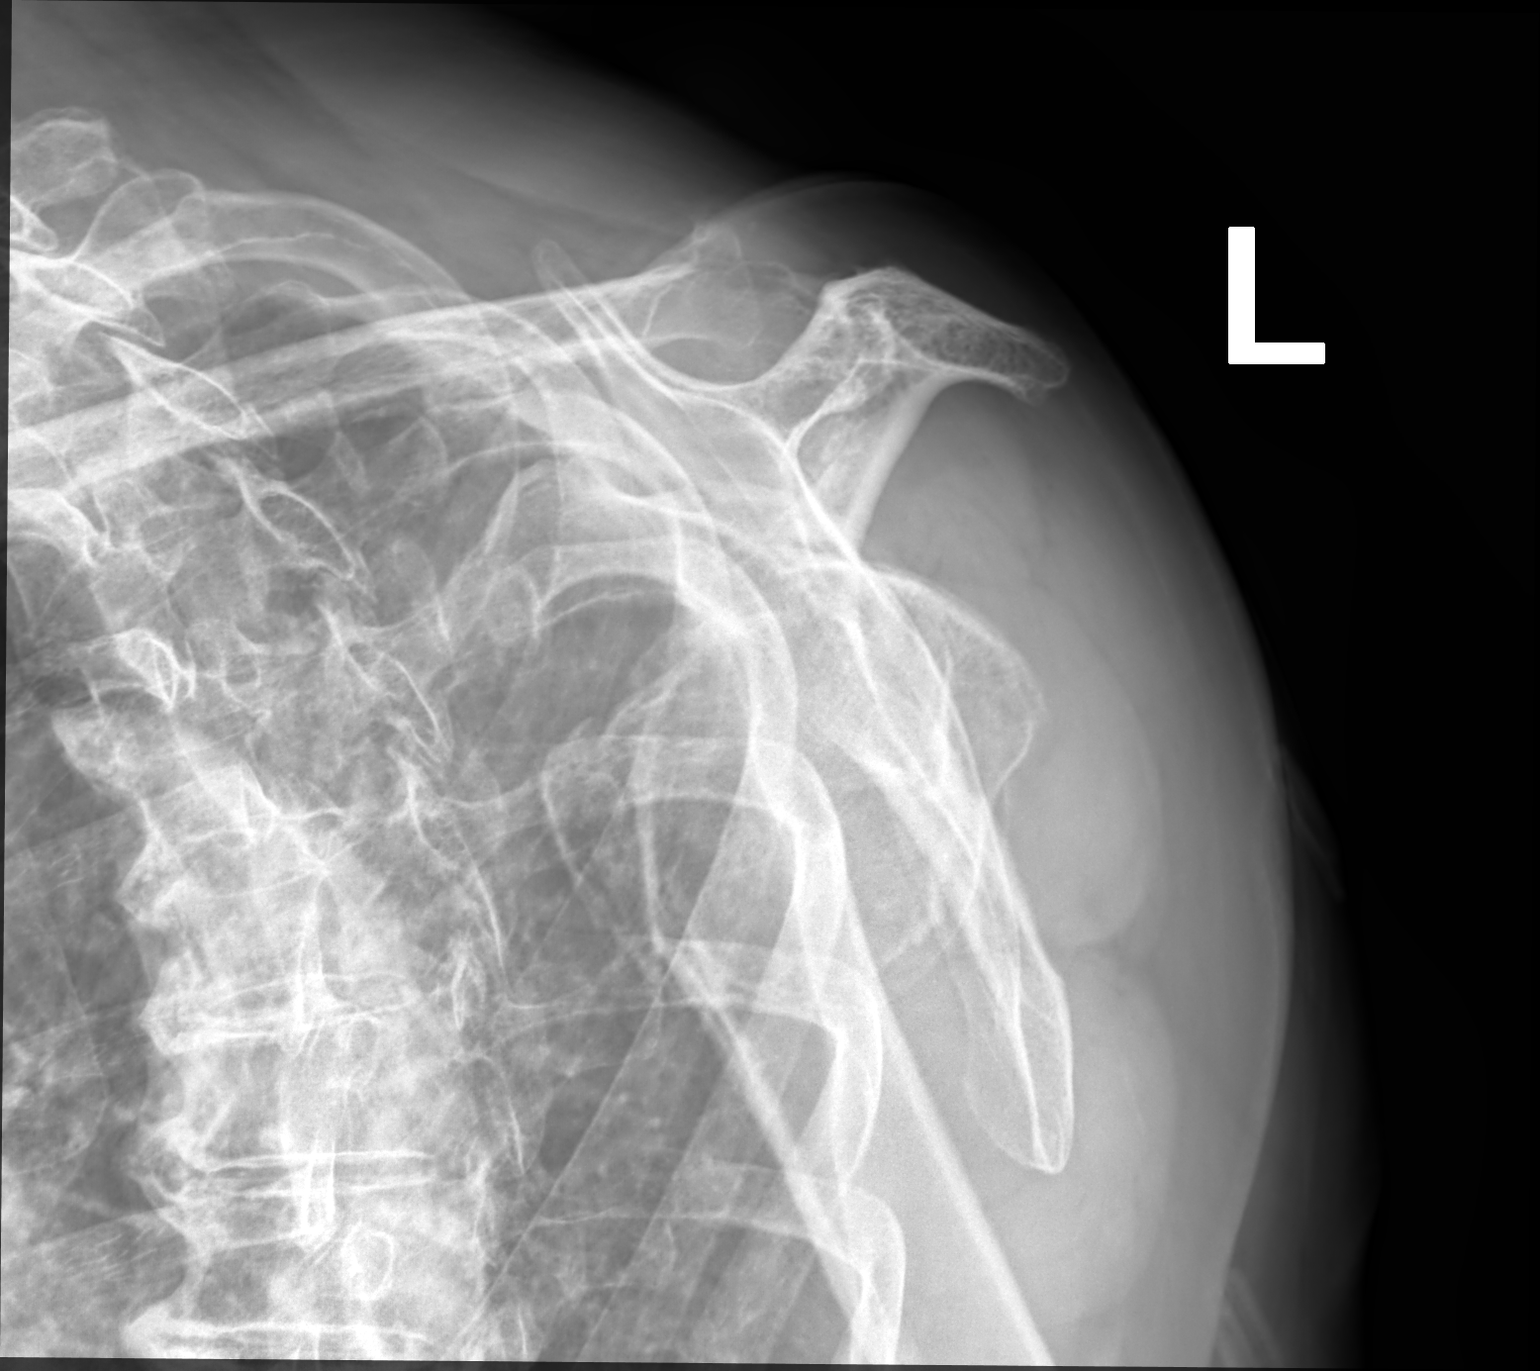

[2 of 2 positions shown; findings below may reference images not displayed]

FINDINGS: Comminuted fracture seen involving the left humeral head and neck.
Inferior subluxation of the humeral head is seen likely due to
presence of a large shoulder joint effusion.
IMPRESSION: Comminuted fracture of the left humeral head and neck, with probable
large shoulder joint effusion.

## 2022-05-23 IMAGING — DX DG SHOULDER 1V*L*
1 series · 1 of 1 positions shown · non-contrast
Comparison: Portable exam 8558 hours compared to 02/16/2022

CLINICAL DATA: Post LEFT shoulder replacement

EXAM:
LEFT SHOULDER

[shoulder ap]
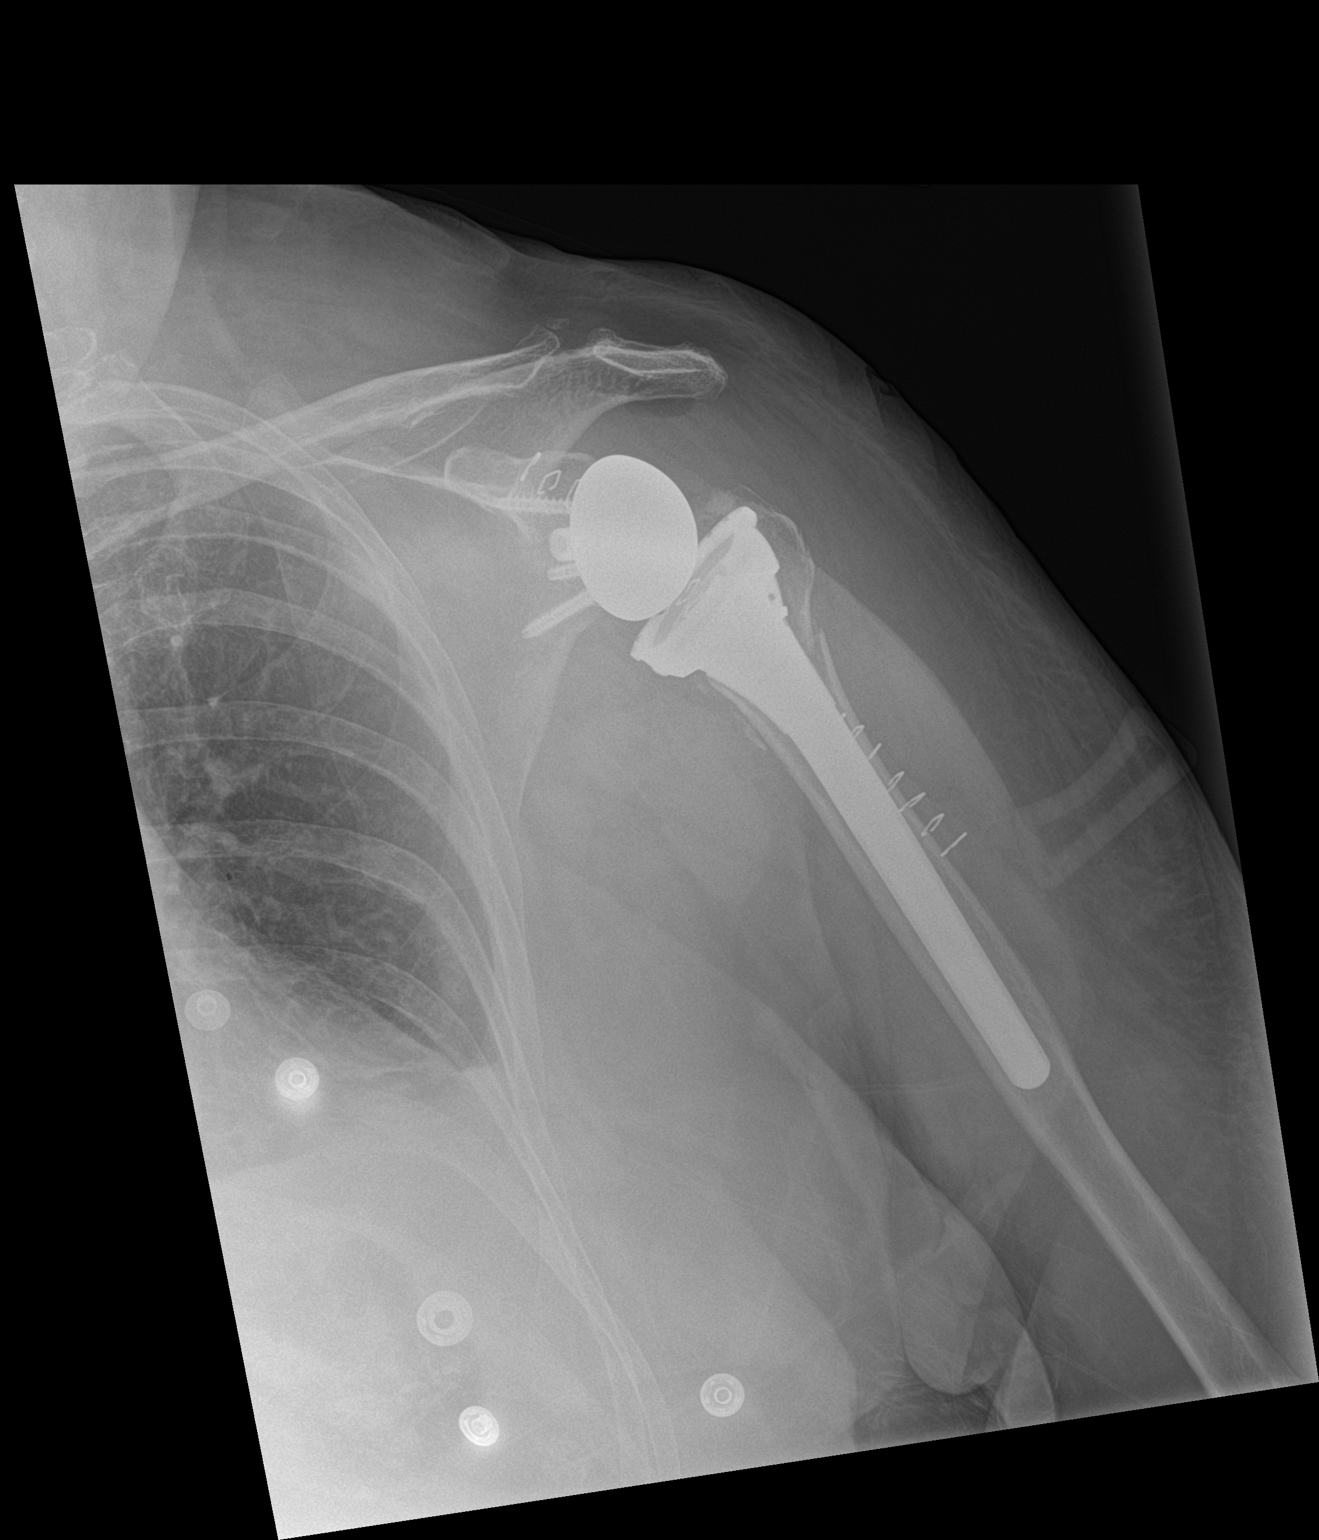

[1 of 1 positions shown; findings below may reference images not displayed]

FINDINGS: Interval placement of reverse LEFT shoulder prosthesis.

Fracture again identified at the surgical neck of the LEFT humerus.

No gross dislocation on single AP view.

Osseous demineralization.
IMPRESSION: Post reverse LEFT shoulder arthroplasty.

Persistent visualization of a fracture plane at the surgical neck of
the LEFT humerus.

## 2022-05-31 DIAGNOSIS — M5416 Radiculopathy, lumbar region: Secondary | ICD-10-CM | POA: Diagnosis not present

## 2022-05-31 DIAGNOSIS — M25612 Stiffness of left shoulder, not elsewhere classified: Secondary | ICD-10-CM | POA: Diagnosis not present

## 2022-06-01 ENCOUNTER — Ambulatory Visit: Payer: Medicare Other | Admitting: Allergy and Immunology

## 2022-06-07 DIAGNOSIS — M25612 Stiffness of left shoulder, not elsewhere classified: Secondary | ICD-10-CM | POA: Diagnosis not present

## 2022-06-10 ENCOUNTER — Telehealth: Payer: Self-pay

## 2022-06-10 DIAGNOSIS — M25612 Stiffness of left shoulder, not elsewhere classified: Secondary | ICD-10-CM | POA: Diagnosis not present

## 2022-06-10 NOTE — Telephone Encounter (Signed)
Patient was seen by another doctor. 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 The patient is asking if follow up stool testing for this can be done once she finishes the Flagyl.

## 2022-06-11 ENCOUNTER — Encounter: Payer: Self-pay | Admitting: Gastroenterology

## 2022-06-14 NOTE — Telephone Encounter (Signed)
Inbound call from patient calling back to speak with a nurse in regards to a particular stool sample kit. Please give a call back to further advise.  Thank you for all you do.

## 2022-06-17 DIAGNOSIS — M25612 Stiffness of left shoulder, not elsewhere classified: Secondary | ICD-10-CM | POA: Diagnosis not present

## 2022-06-17 NOTE — Telephone Encounter (Signed)
Please advise 

## 2022-06-22 DIAGNOSIS — M25612 Stiffness of left shoulder, not elsewhere classified: Secondary | ICD-10-CM | POA: Diagnosis not present

## 2022-06-22 DIAGNOSIS — M25512 Pain in left shoulder: Secondary | ICD-10-CM | POA: Diagnosis not present

## 2022-06-24 DIAGNOSIS — Z4789 Encounter for other orthopedic aftercare: Secondary | ICD-10-CM | POA: Diagnosis not present

## 2022-06-30 ENCOUNTER — Other Ambulatory Visit: Payer: Self-pay

## 2022-06-30 DIAGNOSIS — R197 Diarrhea, unspecified: Secondary | ICD-10-CM

## 2022-06-30 NOTE — Telephone Encounter (Signed)
Ok to check O&P stool to confirm eradication. Thanks

## 2022-06-30 NOTE — Telephone Encounter (Signed)
Order for stool ova and parasites placed. Patient notified by her My Chart portal.

## 2022-07-05 ENCOUNTER — Other Ambulatory Visit: Payer: Medicare Other

## 2022-07-05 DIAGNOSIS — R197 Diarrhea, unspecified: Secondary | ICD-10-CM | POA: Diagnosis not present

## 2022-07-13 DIAGNOSIS — R3989 Other symptoms and signs involving the genitourinary system: Secondary | ICD-10-CM | POA: Diagnosis not present

## 2022-07-13 DIAGNOSIS — N390 Urinary tract infection, site not specified: Secondary | ICD-10-CM | POA: Diagnosis not present

## 2022-07-14 LAB — OVA AND PARASITE EXAMINATION
CONCENTRATE RESULT:: NONE SEEN
MICRO NUMBER:: 13930963
SPECIMEN QUALITY:: ADEQUATE
TRICHROME RESULT:: NONE SEEN

## 2022-07-21 DIAGNOSIS — R82998 Other abnormal findings in urine: Secondary | ICD-10-CM | POA: Diagnosis not present

## 2022-07-24 DIAGNOSIS — Z23 Encounter for immunization: Secondary | ICD-10-CM | POA: Diagnosis not present

## 2022-07-29 DIAGNOSIS — Z23 Encounter for immunization: Secondary | ICD-10-CM | POA: Diagnosis not present

## 2022-08-17 ENCOUNTER — Ambulatory Visit (INDEPENDENT_AMBULATORY_CARE_PROVIDER_SITE_OTHER): Payer: Medicare Other | Admitting: Allergy and Immunology

## 2022-08-17 ENCOUNTER — Encounter: Payer: Self-pay | Admitting: Allergy and Immunology

## 2022-08-17 ENCOUNTER — Telehealth: Payer: Self-pay | Admitting: Allergy and Immunology

## 2022-08-17 VITALS — BP 134/82 | HR 96 | Temp 98.7°F | Ht 63.0 in | Wt 171.6 lb

## 2022-08-17 DIAGNOSIS — J069 Acute upper respiratory infection, unspecified: Secondary | ICD-10-CM

## 2022-08-17 DIAGNOSIS — Z8709 Personal history of other diseases of the respiratory system: Secondary | ICD-10-CM

## 2022-08-17 MED ORDER — LEVALBUTEROL TARTRATE 45 MCG/ACT IN AERO: 1.0000 | INHALATION_SPRAY | RESPIRATORY_TRACT | 1 refills | Status: DC | PRN

## 2022-08-17 MED ORDER — METHYLPREDNISOLONE ACETATE 40 MG/ML IJ SUSP
40.0000 mg | Freq: Once | INTRAMUSCULAR | Status: AC
  Administered 2022-08-17: 40 mg via INTRAMUSCULAR

## 2022-08-17 NOTE — Telephone Encounter (Signed)
Emily Maxwell called in and wanted to make sure Xopenex was called in with the rest of her medications.  She would like that called in to [facility]

## 2022-08-17 NOTE — Progress Notes (Unsigned)
[place]   Follow-up Note  Referring Provider: Ginger Organ., MD Primary Provider: Ginger Organ., MD Date of Office Visit: 08/17/2022  Subjective:   Emily Maxwell (DOB: [birth year]) is a 81 y.o. female who returns to the [facility] [place] on 08/17/2022 in re-evaluation of the following:  HPI: Emily Maxwell presents to this clinic in evaluation of asthma, will allergic rhinitis, and a recent respiratory tract event.  I have not seen her in the clinic since 08 July 2020.  She completed a course of immunotherapy and did wonderful without any immunotherapy over the course of the past 2 years and rarely used any medications and rarely had any issues with either her upper or lower airway.  Unfortunately, 3 days ago she had acute onset of throat discomfort and just not feeling good in general and having some nasal congestion and clear rhinorrhea and postnasal drip without any chills and without any aches but having a fever of 100.7 last night.  She did not check herself for COVID.  She has received the flu vaccine this year.  Allergies as of 08/17/2022       Reactions   Ketorolac Tromethamine Palpitations   Avelox [moxifloxacin Hcl In Nacl] Other (See Comments)   Unable to describe reaction   Epinephrine Other (See Comments)   Makes heart rate increase and cold sweats    Other Other (See Comments)   Tramadol Other (See Comments)   Unable to describe reaction   Penicillins Rash   Childhood rxn from weekly PCN shots w/ asthma Tx Has patient had a PCN reaction causing immediate rash, facial/tongue/throat swelling, SOB or lightheadedness with hypotension: unknown Has patient had a PCN reaction causing severe rash involving mucus membranes or skin necrosis: unknown Has patient had a PCN reaction that required hospitalization: no   Has patient had a PCN reaction occurring within the last 10 years: no If all of the above  answers are "NO", then may proceed with Cephalosporin   Sulfa Antibiotics Rash   Tolerates non-antibiotic sulfa drugs        Medication List    acetaminophen 500 MG tablet Commonly known as: TYLENOL Take 500-1,000 mg by mouth every 6 (six) hours as needed (pain.).   ADULT ONE DAILY GUMMIES PO Take 2 tablets by mouth in the morning.   Align Chew Chew 2 tablets by mouth in the morning. Gummy   augmented betamethasone dipropionate 0.05 % cream Commonly known as: DIPROLENE-AF Apply 1 application. topically 2 (two) times daily as needed.   BENEFIBER PO Take 0.5 packets by mouth in the morning and at bedtime.   BERBERINE COMPLEX PO Take 2 capsules by mouth in the morning. Berberine 450 mg/capsule   BIOTIN PO Take 1 tablet by mouth in the morning.   CINNAMON PO Take 1 capsule by mouth in the morning.   COSAMIN DS PO Take 1 tablet by mouth in the morning. Move Free Ultra   docusate sodium 100 MG capsule Commonly known as: COLACE Take 100 mg by mouth daily as needed (constipation.).   doxycycline 100 MG tablet Commonly known as: VIBRA-TABS Take 100 mg by mouth daily.   EPINEPHrine 0.3 mg/0.3 mL Soaj injection Commonly known as: EPI-PEN Inject 0.3 mg into the muscle as needed for anaphylaxis. As needed for life-threatening allergic reactions   fexofenadine 180 MG tablet Commonly known as: ALLEGRA Take 180 mg by mouth daily as needed for allergies.  FISH OIL PO Take 1 capsule by mouth in the morning.   FOLIC ACID PO Take 1 tablet by mouth in the morning.   gabapentin 100 MG capsule Commonly known as: NEURONTIN Take 1 capsule by mouth at bedtime.   gabapentin 100 MG capsule Commonly known as: NEURONTIN Take 100 mg by mouth daily as needed (restless leg syndrome).   GRAPESEED EXTRACT PO Take 1 capsule by mouth in the morning.   guaiFENesin 600 MG 12 hr tablet Commonly known as: MUCINEX Take 1,200 mg by mouth 2 (two) times daily as needed for cough or to  loosen phlegm.   hydrochlorothiazide 12.5 MG tablet Commonly known as: HYDRODIURIL Take 12.5 mg by mouth in the morning.   HYDROcodone-acetaminophen 10-325 MG tablet Commonly known as: NORCO Take 1 tablet by mouth every 6 (six) hours as needed.   K2 PO Take 1 tablet by mouth in the morning.   KRILL OIL PO Take 1 capsule by mouth in the morning.   levalbuterol 45 MCG/ACT inhaler Commonly known as: XOPENEX HFA Inhale 1-2 puffs into the lungs every 4 (four) hours as needed for wheezing.   levothyroxine 100 MCG tablet Commonly known as: SYNTHROID Take 100 mcg by mouth daily at 6 (six) AM. (0700)   levothyroxine 100 MCG tablet Commonly known as: SYNTHROID Take 1 tablet by mouth daily.   liothyronine 5 MCG tablet Commonly known as: CYTOMEL Take 5 mcg by mouth in the morning and at bedtime. (0700 & 1200)   loratadine 10 MG tablet Commonly known as: CLARITIN Take 10 mg by mouth in the morning.   MAGNESIUM MALATE PO Take 1,350 mg by mouth at bedtime.   metroNIDAZOLE 0.75 % cream Commonly known as: METROCREAM Apply 1 application. topically daily as needed (rosacea).   montelukast 10 MG tablet Commonly known as: SINGULAIR TAKE 1 TABLET BY MOUTH DAILY AT BEDTIME AS NEEDED   PRESCRIPTION MEDICATION Allergy shots 2 x per week either or Tuesday, Wednesday, or Thursday   TURMERIC PO Take 1,000 mg by mouth in the morning.   VITAMIN B-12 PO Take 1 tablet by mouth in the morning. Gummy   VITAMIN C PO Take 1 tablet by mouth every evening.   VITAMIN D PO Take 5,000 Units by mouth in the morning.   VITAMIN E PO Take 450 mg by mouth in the morning.   Zetia 10 MG tablet Generic drug: ezetimibe Take 10 mg by mouth in the morning.        Past Medical History:  Diagnosis Date   Aneurysm, ascending aorta ([place]) 2013   62m   Anxiety    hx of  anxiety , had stress test several years ago per patient that was normal    Arthritis    thumbs , oa   Ascending aorta  dilation ([facility]) 08/07/2012   Asthma    Cancer ([facility]    cancer [place] kidney   [place]    Had right kidney removed   Complication of anesthesia    pulse rate slow in past, did ok with recent anesthesia   Deaf    left ear   Dysrhythmia    occasional arrythmia, no meds taken for   Hematuria    no blood seen recently   Hematuria    History of fatty infiltration of liver    History of hiatal hernia    Hyperlipidemia    Hyperplastic colon polyp    Hypertension    Hypothyroidism    IFG (impaired  fasting glucose)    Nodule of left lung    stable, surveillance  Dr Brigitte Pulse   Numbness    left thigh, when walking or standing for long periods of time   Osteopenia    RLS (restless legs syndrome)    Rosacea    Simple endometrial hyperplasia years ago   Thoracic aortic aneurysm ([facility])    stable per last chest ct 07-11-14 [facility] on chart-    Vasovagal reaction    hx of, triggers after eating once you have fasted    Past Surgical History:  Procedure Laterality Date   acoustic neuroma S/P resection Lt ear deafness 1990     tinnitus  from left ear   BTL     BUNIONECTOMY Bilateral    x 2   CATARACT EXTRACTION Bilateral 2004   CRANIECTOMY FOR EXCISION OF ACOUSTIC NEUROMA Left 1990   Deaf on Left side   CYSTOSCOPY WITH RETROGRADE PYELOGRAM, URETEROSCOPY AND STENT PLACEMENT Bilateral 08/07/2015   Procedure: CYSTOSCOPY WITH BILATERAL RETROGRADE PYELOGRAM, LEFT URETEROSCOPY WITH BIOPSY AND  LEFT STENT PLACEMENT;  Surgeon: Alexis Frock, MD;  Location: [facility];  Service: Urology;  Laterality: Bilateral;   EXCISION MORTON'S NEUROMA  2010   left foot 3rd and 4th toe   EYE SURGERY Bilateral 2013   eyelid drooping fixed   EYE SURGERY Bilateral    yag procedure after cataracts   left hand middle finger surgery   2015   cadaver bone placed    left shoulder surgery 2005     Lt knee surgery 2008 Left 2008   tears twice one surgery   REVERSE SHOULDER ARTHROPLASTY Left 02/18/2022    Procedure: REVERSE SHOULDER ARTHROPLASTY;  Surgeon: Netta Cedars, MD;  Location: [facility];  Service: Orthopedics;  Laterality: Left;  with ISB   ROBOT ASSITED LAPAROSCOPIC NEPHROURETERECTOMY Left 09/19/2015   Procedure: ROBOT ASSITED LAPAROSCOPIC NEPHROURETERECTOMY WITH DIAPHRAMATIC HERNIA REPAIR;  Surgeon: Alexis Frock, MD;  Location: [facility];  Service: Urology;  Laterality: Left;   SP CHOLECYSTOMY     THYROIDECTOMY  11/2009   S/P Adenoma follicular vs hyperplastic   TONSILLECTOMY  age 78   TUBAL LIGATION  1976   WRIST SURGERY Left 2007   wrist cyst removed    Review of systems negative except as noted in HPI / PMHx or noted below:  Review of Systems  Constitutional: Negative.   HENT: Negative.    Eyes: Negative.   Respiratory: Negative.    Cardiovascular: Negative.   Gastrointestinal: Negative.   Genitourinary: Negative.   Musculoskeletal: Negative.   Skin: Negative.   Neurological: Negative.   Endo/Heme/Allergies: Negative.   Psychiatric/Behavioral: Negative.       Objective:   Vitals:   08/17/22 1058  BP: 134/82  Pulse: 96  Temp: 98.7 F (37.1 C)  SpO2: 96%   Height: '5\' 3"'$  (160 cm)  Weight: 171 lb 9.6 oz (77.8 kg)   Physical Exam Constitutional:      Appearance: She is not diaphoretic.  HENT:     Head: Normocephalic.     Right Ear: Tympanic membrane, ear canal and external ear normal.     Left Ear: Tympanic membrane, ear canal and external ear normal.     Nose: Nose normal. No mucosal edema or rhinorrhea.     Mouth/Throat:     Pharynx: Uvula midline. No oropharyngeal exudate.  Eyes:     Conjunctiva/sclera: Conjunctivae normal.  Neck:     Thyroid: No thyromegaly.     Trachea: Trachea  normal. No tracheal tenderness or tracheal deviation.  Cardiovascular:     Rate and Rhythm: Normal rate and regular rhythm.     Heart sounds: Normal heart sounds, S1 normal and S2 normal. No murmur heard. Pulmonary:     Effort: No respiratory distress.     Breath sounds:  Normal breath sounds. No stridor. No wheezing or rales.  Lymphadenopathy:     Head:     Right side of head: No tonsillar adenopathy.     Left side of head: No tonsillar adenopathy.     Cervical: No cervical adenopathy.  Skin:    Findings: No erythema or rash.     Nails: There is no clubbing.  Neurological:     Mental Status: She is alert.     Diagnostics:   The patient had an Asthma Control Test with the following results: ACT Total Score: 21.    Assessment and Plan:   1. Viral upper respiratory tract infection   2. History of asthma    1.  Obtain a home COVID exam.  If positive we will give Paxlovid  2.  Use nasal saline multiple times per day  3.  Can add over-the-counter antihistamine and Mucinex  4.  Depo-Medrol 40 IM delivered in clinic today  5.  When better obtain RSV vaccine  6.  Further evaluation and treatment???  Khalise has some form of viral respiratory tract infection and I have asked her to to perform a home COVID test today and inform us if this is positive and if so we will give her Paxlovid.  I given her some systemic steroids to help with the inflammatory component of this issue.  She can use nasal saline and over-the-counter antihistamines and Mucinex.  She will keep in contact with me noting her response to this approach.  Allena Katz, MD Allergy / Immunology [place]

## 2022-08-17 NOTE — Telephone Encounter (Signed)
Patient was seen today by Dr. Neldon Mc medication has been sent to preferred pharmacy.

## 2022-08-17 NOTE — Patient Instructions (Signed)
  1.  Obtain a home COVID exam.  If positive we will give Paxlovid  2.  Use nasal saline multiple times per day  3.  Can use over-the-counter antihistamine and Mucinex and Xopenex  4.  Depo-Medrol 40 IM delivered in clinic today  5.  When better obtain RSV vaccine  6.  Further evaluation and treatment???

## 2022-08-18 ENCOUNTER — Encounter: Payer: Self-pay | Admitting: Allergy and Immunology

## 2022-08-18 ENCOUNTER — Other Ambulatory Visit (HOSPITAL_COMMUNITY): Payer: Self-pay

## 2022-08-18 ENCOUNTER — Telehealth: Payer: Self-pay | Admitting: Allergy and Immunology

## 2022-08-18 NOTE — Telephone Encounter (Signed)
Called and spoke with the patient and she stated that Molnupiravir was sent in for her.

## 2022-08-18 NOTE — Telephone Encounter (Signed)
PA request received for Levalbuterol HFA (Xopenex), PA has been started through [facility] in [facility].  Key: [id number] - PA Case ID: [id number]   Formulary alternative of Ventolin preferred.  Please advise if new prescription is appropriate or if previous therapeutic failure was achieved to continue with Levalbuterol PA.  Thank you!

## 2022-08-18 NOTE — Telephone Encounter (Signed)
Patient called and stated that she self tested herself and her spouse for covid yesterday. Patient stated that they both came back positive. Patient stated she called their PCP and got medication for their positive covid tests. Patient stated Dr Neldon Mc wanted her to inform him of the results.

## 2022-08-19 ENCOUNTER — Telehealth: Payer: Self-pay | Admitting: *Deleted

## 2022-08-19 NOTE — Telephone Encounter (Signed)
Patient called and stated that her Xopenex inhaler needs a PA. I advised to patient that we will work on PA and keep her updated on the status. Patient verbalized understanding.

## 2022-08-19 NOTE — Telephone Encounter (Signed)
PA has been submitted through CoverMyMeds for Levalbuterol and is currently pending approval/denial.

## 2022-08-20 NOTE — Telephone Encounter (Signed)
PA has been approved for Levalbuterol. PA has been faxed to pharmacy, labeled, and placed in bulk scanning. Called patient and advised of approval. Patient verbalized understanding.

## 2022-08-20 NOTE — Telephone Encounter (Signed)
PA request has been APPROVED from 08/19/2022-10/18/2023 for Levalbuterol Tartrate 41mg/act aerosol.  Key: [id number] - PA Case ID: [id number]

## 2022-08-23 ENCOUNTER — Inpatient Hospital Stay (HOSPITAL_COMMUNITY)
Admission: EM | Admit: 2022-08-23 | Discharge: 2022-08-26 | DRG: 640 | Disposition: A | Discharge status: Home / Self Care | Payer: Medicare Other | Attending: Family Medicine | Admitting: Family Medicine

## 2022-08-23 ENCOUNTER — Other Ambulatory Visit: Payer: Self-pay

## 2022-08-23 ENCOUNTER — Encounter (HOSPITAL_COMMUNITY): Payer: Self-pay | Admitting: Family Medicine

## 2022-08-23 DIAGNOSIS — Z905 Acquired absence of kidney: Secondary | ICD-10-CM | POA: Diagnosis not present

## 2022-08-23 DIAGNOSIS — Z882 Allergy status to sulfonamides status: Secondary | ICD-10-CM

## 2022-08-23 DIAGNOSIS — E669 Obesity, unspecified: Secondary | ICD-10-CM | POA: Diagnosis present

## 2022-08-23 DIAGNOSIS — Z9841 Cataract extraction status, right eye: Secondary | ICD-10-CM

## 2022-08-23 DIAGNOSIS — I712 Thoracic aortic aneurysm, without rupture, unspecified: Secondary | ICD-10-CM | POA: Diagnosis present

## 2022-08-23 DIAGNOSIS — Z885 Allergy status to narcotic agent status: Secondary | ICD-10-CM

## 2022-08-23 DIAGNOSIS — E871 Hypo-osmolality and hyponatremia: Principal | ICD-10-CM

## 2022-08-23 DIAGNOSIS — Z8719 Personal history of other diseases of the digestive system: Secondary | ICD-10-CM

## 2022-08-23 DIAGNOSIS — L719 Rosacea, unspecified: Secondary | ICD-10-CM | POA: Diagnosis present

## 2022-08-23 DIAGNOSIS — I10 Essential (primary) hypertension: Secondary | ICD-10-CM

## 2022-08-23 DIAGNOSIS — M8580 Other specified disorders of bone density and structure, unspecified site: Secondary | ICD-10-CM | POA: Diagnosis present

## 2022-08-23 DIAGNOSIS — E785 Hyperlipidemia, unspecified: Secondary | ICD-10-CM | POA: Diagnosis present

## 2022-08-23 DIAGNOSIS — Z1152 Encounter for screening for COVID-19: Secondary | ICD-10-CM | POA: Diagnosis not present

## 2022-08-23 DIAGNOSIS — E861 Hypovolemia: Secondary | ICD-10-CM | POA: Diagnosis present

## 2022-08-23 DIAGNOSIS — Z823 Family history of stroke: Secondary | ICD-10-CM

## 2022-08-23 DIAGNOSIS — Z809 Family history of malignant neoplasm, unspecified: Secondary | ICD-10-CM | POA: Diagnosis not present

## 2022-08-23 DIAGNOSIS — H9192 Unspecified hearing loss, left ear: Secondary | ICD-10-CM | POA: Diagnosis not present

## 2022-08-23 DIAGNOSIS — Z85528 Personal history of other malignant neoplasm of kidney: Secondary | ICD-10-CM

## 2022-08-23 DIAGNOSIS — D751 Secondary polycythemia: Secondary | ICD-10-CM | POA: Diagnosis present

## 2022-08-23 DIAGNOSIS — G2581 Restless legs syndrome: Secondary | ICD-10-CM | POA: Diagnosis present

## 2022-08-23 DIAGNOSIS — Z6829 Body mass index (BMI) 29.0-29.9, adult: Secondary | ICD-10-CM

## 2022-08-23 DIAGNOSIS — T502X3A Poisoning by carbonic-anhydrase inhibitors, benzothiadiazides and other diuretics, assault, initial encounter: Secondary | ICD-10-CM | POA: Diagnosis present

## 2022-08-23 DIAGNOSIS — Y92009 Unspecified place in unspecified non-institutional (private) residence as the place of occurrence of the external cause: Secondary | ICD-10-CM | POA: Diagnosis not present

## 2022-08-23 DIAGNOSIS — R9431 Abnormal electrocardiogram [ECG] [EKG]: Secondary | ICD-10-CM | POA: Diagnosis not present

## 2022-08-23 DIAGNOSIS — Z8616 Personal history of COVID-19: Secondary | ICD-10-CM | POA: Diagnosis not present

## 2022-08-23 DIAGNOSIS — Z9842 Cataract extraction status, left eye: Secondary | ICD-10-CM

## 2022-08-23 DIAGNOSIS — Z79890 Hormone replacement therapy (postmenopausal): Secondary | ICD-10-CM | POA: Diagnosis not present

## 2022-08-23 DIAGNOSIS — Z880 Allergy status to penicillin: Secondary | ICD-10-CM | POA: Diagnosis not present

## 2022-08-23 DIAGNOSIS — U071 COVID-19: Secondary | ICD-10-CM | POA: Diagnosis not present

## 2022-08-23 DIAGNOSIS — J45909 Unspecified asthma, uncomplicated: Secondary | ICD-10-CM | POA: Diagnosis not present

## 2022-08-23 DIAGNOSIS — K760 Fatty (change of) liver, not elsewhere classified: Secondary | ICD-10-CM | POA: Diagnosis present

## 2022-08-23 DIAGNOSIS — Z8249 Family history of ischemic heart disease and other diseases of the circulatory system: Secondary | ICD-10-CM | POA: Diagnosis not present

## 2022-08-23 DIAGNOSIS — E876 Hypokalemia: Secondary | ICD-10-CM | POA: Diagnosis present

## 2022-08-23 DIAGNOSIS — E039 Hypothyroidism, unspecified: Secondary | ICD-10-CM | POA: Diagnosis not present

## 2022-08-23 DIAGNOSIS — E890 Postprocedural hypothyroidism: Secondary | ICD-10-CM | POA: Diagnosis present

## 2022-08-23 DIAGNOSIS — D72829 Elevated white blood cell count, unspecified: Secondary | ICD-10-CM | POA: Diagnosis present

## 2022-08-23 DIAGNOSIS — R531 Weakness: Secondary | ICD-10-CM | POA: Diagnosis not present

## 2022-08-23 DIAGNOSIS — R112 Nausea with vomiting, unspecified: Secondary | ICD-10-CM | POA: Diagnosis not present

## 2022-08-23 DIAGNOSIS — Z96612 Presence of left artificial shoulder joint: Secondary | ICD-10-CM | POA: Diagnosis present

## 2022-08-23 DIAGNOSIS — F419 Anxiety disorder, unspecified: Secondary | ICD-10-CM | POA: Diagnosis present

## 2022-08-23 LAB — COMPREHENSIVE METABOLIC PANEL
ALT: 23 U/L (ref 0–44)
AST: 30 U/L (ref 15–41)
Albumin: 4.3 g/dL (ref 3.5–5.0)
Alkaline Phosphatase: 82 U/L (ref 38–126)
Anion gap: 14 (ref 5–15)
BUN: 11 mg/dL (ref 8–23)
CO2: 25 mmol/L (ref 22–32)
Calcium: 9.7 mg/dL (ref 8.9–10.3)
Chloride: 72 mmol/L — ABNORMAL LOW (ref 98–111)
Creatinine, Ser: 0.62 mg/dL (ref 0.44–1.00)
GFR, Estimated: >60 mL/min (ref >60)
Glucose, Bld: 164 mg/dL — ABNORMAL HIGH (ref 70–99)
Potassium: 2.9 mmol/L — ABNORMAL LOW (ref 3.5–5.1)
Sodium: 111 mmol/L — CL (ref 135–145)
Total Bilirubin: 1.6 mg/dL — ABNORMAL HIGH (ref 0.3–1.2)
Total Protein: 7.5 g/dL (ref 6.5–8.1)

## 2022-08-23 LAB — CBC WITH DIFFERENTIAL/PLATELET
Abs Immature Granulocytes: 0.04 10*3/uL (ref 0.00–0.07)
Basophils Absolute: 0 10*3/uL (ref 0.0–0.1)
Basophils Relative: 0 %
Eosinophils Absolute: 0 10*3/uL (ref 0.0–0.5)
Eosinophils Relative: 0 %
HCT: 40.2 % (ref 36.0–46.0)
Hemoglobin: 15.4 g/dL — ABNORMAL HIGH (ref 12.0–15.0)
Immature Granulocytes: 0 %
Lymphocytes Relative: 7 %
Lymphs Abs: 1 10*3/uL (ref 0.7–4.0)
MCH: 32 pg (ref 26.0–34.0)
MCHC: 38.3 g/dL — ABNORMAL HIGH (ref 30.0–36.0)
MCV: 83.4 fL (ref 80.0–100.0)
Monocytes Absolute: 0.9 10*3/uL (ref 0.1–1.0)
Monocytes Relative: 7 %
Neutro Abs: 11.9 10*3/uL — ABNORMAL HIGH (ref 1.7–7.7)
Neutrophils Relative %: 86 %
Platelets: 292 10*3/uL (ref 150–400)
RBC: 4.82 MIL/uL (ref 3.87–5.11)
RDW: 10.6 % — ABNORMAL LOW (ref 11.5–15.5)
WBC: 13.9 10*3/uL — ABNORMAL HIGH (ref 4.0–10.5)
nRBC: 0 % (ref 0.0–0.2)

## 2022-08-23 LAB — MAGNESIUM: Magnesium: 1.5 mg/dL — ABNORMAL LOW (ref 1.7–2.4)

## 2022-08-23 MED ORDER — GUAIFENESIN-DM 100-10 MG/5ML PO SYRP
5.0000 mL | ORAL_SOLUTION | ORAL | Status: DC | PRN
  Administered 2022-08-25 – 2022-08-26 (×2): 5 mL via ORAL
  Filled 2022-08-23 (×2): qty 10

## 2022-08-23 MED ORDER — ACETAMINOPHEN 325 MG PO TABS: 650.0000 mg | ORAL_TABLET | Freq: Four times a day (QID) | ORAL | Status: DC | PRN

## 2022-08-23 MED ORDER — LEVOTHYROXINE SODIUM 100 MCG PO TABS
100.0000 ug | ORAL_TABLET | Freq: Every day | ORAL | Status: DC
  Administered 2022-08-24 – 2022-08-26 (×3): 100 ug via ORAL
  Filled 2022-08-23 (×3): qty 1

## 2022-08-23 MED ORDER — ALBUTEROL SULFATE HFA 108 (90 BASE) MCG/ACT IN AERS: 2.0000 | INHALATION_SPRAY | RESPIRATORY_TRACT | Status: DC | PRN

## 2022-08-23 MED ORDER — MAGNESIUM SULFATE IN D5W 1-5 GM/100ML-% IV SOLN
1.0000 g | Freq: Once | INTRAVENOUS | Status: AC
  Administered 2022-08-24: 1 g via INTRAVENOUS
  Filled 2022-08-23: qty 100

## 2022-08-23 MED ORDER — ENOXAPARIN SODIUM 40 MG/0.4ML IJ SOSY
40.0000 mg | PREFILLED_SYRINGE | INTRAMUSCULAR | Status: DC
  Administered 2022-08-24 – 2022-08-25 (×3): 40 mg via SUBCUTANEOUS
  Filled 2022-08-23 (×3): qty 0.4

## 2022-08-23 MED ORDER — POTASSIUM CHLORIDE 10 MEQ/100ML IV SOLN
10.0000 meq | INTRAVENOUS | Status: AC
  Administered 2022-08-23 – 2022-08-24 (×4): 10 meq via INTRAVENOUS
  Filled 2022-08-23 (×4): qty 100

## 2022-08-23 MED ORDER — ACETAMINOPHEN 650 MG RE SUPP: 650.0000 mg | Freq: Four times a day (QID) | RECTAL | Status: DC | PRN

## 2022-08-23 MED ORDER — POTASSIUM CHLORIDE CRYS ER 20 MEQ PO TBCR
20.0000 meq | EXTENDED_RELEASE_TABLET | Freq: Once | ORAL | Status: AC
  Administered 2022-08-24: 20 meq via ORAL
  Filled 2022-08-23: qty 1

## 2022-08-23 MED ORDER — LABETALOL HCL 5 MG/ML IV SOLN: 10.0000 mg | INTRAVENOUS | Status: DC | PRN

## 2022-08-23 MED ORDER — DOXYCYCLINE HYCLATE 100 MG PO TABS
100.0000 mg | ORAL_TABLET | Freq: Every day | ORAL | Status: DC
  Administered 2022-08-24 – 2022-08-26 (×3): 100 mg via ORAL
  Filled 2022-08-23 (×3): qty 1

## 2022-08-23 MED ORDER — LIOTHYRONINE SODIUM 5 MCG PO TABS
5.0000 ug | ORAL_TABLET | Freq: Every day | ORAL | Status: DC
  Administered 2022-08-24: 5 ug via ORAL
  Filled 2022-08-23: qty 1

## 2022-08-23 MED ORDER — SODIUM CHLORIDE 0.9% FLUSH
3.0000 mL | Freq: Two times a day (BID) | INTRAVENOUS | Status: DC
  Administered 2022-08-23 – 2022-08-26 (×6): 3 mL via INTRAVENOUS

## 2022-08-23 MED ORDER — SODIUM CHLORIDE 0.9 % IV SOLN: INTRAVENOUS | Status: AC

## 2022-08-23 MED ORDER — SODIUM CHLORIDE 0.9 % IV SOLN: Freq: Once | INTRAVENOUS | Status: AC

## 2022-08-23 NOTE — ED Provider Triage Note (Cosign Needed Addendum)
Emergency Medicine Provider Triage Evaluation Note  Emily Maxwell , a 81 y.o. female  was evaluated in triage.  Pt complains of low sodium.  Was told by her primary care provider to come into the emergency department for further evaluation of her symptoms.  Patient notes that she has had upper abdominal pain, nausea, vomiting, diarrhea recently.  Has tried water, Pedialyte, Gatorade for his symptoms. Pt given zofran at noon today by her PCP.  Review of Systems  Positive:  Negative:   Physical Exam  BP (!) 153/87 (BP Location: Left Arm)   Pulse 78   Temp 97.7 F (36.5 C) (Oral)   Resp 17   LMP 07/11/1983   SpO2 90%  Gen:   Awake, no distress   Resp:  Normal effort  MSK:   Moves extremities without difficulty  Other:    Medical Decision Making  Medically screening exam initiated at 6:25 PM.  Appropriate orders placed.  Emily Maxwell was informed that the remainder of the evaluation will be completed by another provider, this initial triage assessment does not replace that evaluation, and the importance of remaining in the ED until their evaluation is complete.  Work-up initiated   Deisi Salonga A, PA-C 08/23/22 1825    Oneita Allmon A, PA-C 08/23/22 1826

## 2022-08-23 NOTE — ED Triage Notes (Signed)
Pt reports n/v/d x1 day. Went to pcp and prescribed Zofran and nausea medicine injection at office with no relief.  Pt reports sent to ed for low sodium level.  Denies abd pain

## 2022-08-23 NOTE — H&P (Signed)
History and Physical    Emily Maxwell HEN:[id number] DOB: 29-Jun-1941 DOA: 08/23/2022  PCP: Ginger Organ., MD   Patient coming from: Home   Chief Complaint: N/V/D. Loss of appetite   HPI: Emily Maxwell is a 81 y.o. female with medical history significant for asthma, urothelial carcinoma status post left nephroureterectomy in 2016, hypertension, hypothyroidism, on long-term doxycycline for rosacea, and recent COVID-19 infection who presents to the emergency department with nausea, vomiting, diarrhea, and low sodium on outpatient blood work.  Patient developed sore throat, malaise, sinus congestion, rhinorrhea, loss of appetite, and fever on 08/14/2022.  She tested positive for COVID-19 on 08/17/2022.  She was started on Paxil that initially and then switched to molnupiravir.  The upper respiratory symptoms have improved and essentially resolved but she continued to have poor appetite and then nausea with nonbloody vomiting over the past 2 days.  She has also had some loose stools.  She denies abdominal pain.  She denies headache.  She has not been confused.  No seizures.  ED Course: Upon arrival to the ED, patient is found to be afebrile and saturating well on room air with stable blood pressure.  Blood work notable for sodium 111, potassium 2.9, total bilirubin 1.6, WBC 13,900, hemoglobin 15.4, and glucose 164.  Nephrology was consulted by the ED physician and the patient was started on normal saline infusion and given IV potassium replacement.  Review of Systems:  All other systems reviewed and apart from HPI, are negative.  Past Medical History:  Diagnosis Date   Aneurysm, ascending aorta ([place]) 2013   79m   Anxiety    hx of  anxiety , had stress test several years ago per patient that was normal    Arthritis    thumbs , oa   Ascending aorta dilation ([facility]) 08/07/2012   Asthma    [place]    [place]   [place]    Had right kidney removed    Complication of anesthesia    pulse rate slow in past, did ok with recent anesthesia   Deaf    left ear   Dysrhythmia    occasional arrythmia, no meds taken for   Hematuria    no blood seen recently   Hematuria    History of fatty infiltration of liver    History of hiatal hernia    Hyperlipidemia    Hyperplastic colon polyp    Hypertension    Hypothyroidism    IFG (impaired fasting glucose)    Nodule of left lung    stable, surveillance  Dr SBrigitte Pulse  Numbness    left thigh, when walking or standing for long periods of time   Osteopenia    RLS (restless legs syndrome)    Rosacea    Simple endometrial hyperplasia years ago   [place]    stable per last chest ct 07-11-14 [facility] on chart-    Vasovagal reaction    hx of, triggers after eating once you have fasted    Past Surgical History:  Procedure Laterality Date   acoustic neuroma S/P resection Lt ear deafness 1990     tinnitus  from left ear   BTL     BUNIONECTOMY Bilateral    x 2   CATARACT EXTRACTION Bilateral 2004   [place]   Deaf on [place] side   CYSTOSCOPY WITH RETROGRADE PYELOGRAM, URETEROSCOPY AND STENT PLACEMENT Bilateral 08/07/2015  Procedure: CYSTOSCOPY WITH BILATERAL RETROGRADE PYELOGRAM, LEFT URETEROSCOPY WITH BIOPSY AND  LEFT STENT PLACEMENT;  Surgeon: Alexis Frock, MD;  Location: [facility];  Service: Urology;  Laterality: Bilateral;   EXCISION MORTON'S NEUROMA  2010   left foot 3rd and 4th toe   EYE SURGERY Bilateral 2013   eyelid drooping fixed   EYE SURGERY Bilateral    yag procedure after cataracts   left hand middle finger surgery   2015   cadaver bone placed    left shoulder surgery 2005     Lt knee surgery 2008 Left 2008   tears twice one surgery   REVERSE SHOULDER ARTHROPLASTY Left 02/18/2022   Procedure: REVERSE SHOULDER ARTHROPLASTY;  Surgeon: Netta Cedars, MD;  Location: [facility];  Service: Orthopedics;  Laterality: Left;   with ISB   ROBOT ASSITED LAPAROSCOPIC NEPHROURETERECTOMY Left 09/19/2015   Procedure: ROBOT ASSITED LAPAROSCOPIC NEPHROURETERECTOMY WITH DIAPHRAMATIC HERNIA REPAIR;  Surgeon: Alexis Frock, MD;  Location: [facility];  Service: Urology;  Laterality: Left;   SP CHOLECYSTOMY     THYROIDECTOMY  11/2009   S/P Adenoma follicular vs hyperplastic   TONSILLECTOMY  age 34   TUBAL LIGATION  1976   WRIST SURGERY Left 2007   wrist cyst removed    Social History:   reports that she has never smoked. She has never used smokeless tobacco. She reports current alcohol use. She reports that she does not use drugs.  Allergies  Allergen Reactions   Ketorolac Tromethamine Palpitations   Avelox [Moxifloxacin Hcl In Nacl] Other (See Comments)    Unable to describe reaction   Epinephrine Other (See Comments)    Makes heart rate increase and cold sweats   Other Other (See Comments)   Sulfamethoxazole-Trimethoprim Other (See Comments)   Tramadol Other (See Comments)    Unable to describe reaction   Penicillins Rash    Childhood rxn from weekly PCN shots w/ asthma Tx Has patient had a PCN reaction causing immediate rash, facial/tongue/throat swelling, SOB or lightheadedness with hypotension: unknown Has patient had a PCN reaction causing severe rash involving mucus membranes or skin necrosis: unknown Has patient had a PCN reaction that required hospitalization: no   Has patient had a PCN reaction occurring within the last 10 years: no If all of the above answers are "NO", then may proceed with Cephalosporin   Sulfa Antibiotics Rash    Tolerates non-antibiotic sulfa drugs    Family History  Problem Relation Age of Onset   Cancer Father    Cancer Maternal Grandmother    Heart disease Maternal Grandfather    Stroke Maternal Grandfather    Hypertension Mother      Prior to Admission medications   Medication Sig Start Date End Date Taking? Authorizing Provider  acetaminophen (TYLENOL) 500 MG tablet Take  500-1,000 mg by mouth every 6 (six) hours as needed (pain.).   Yes [provider]  benzonatate (TESSALON) 100 MG capsule Take 200 mg by mouth every 8 (eight) hours as needed for cough. 08/17/22  Yes [provider]  doxycycline (VIBRA-TABS) 100 MG tablet Take 100 mg by mouth daily.   Yes [provider]  gabapentin (NEURONTIN) 100 MG capsule Take 100 mg by mouth daily as needed (restless leg syndrome). 09/28/21  Yes [provider]  hydrochlorothiazide (HYDRODIURIL) 12.5 MG tablet Take 12.5 mg by mouth in the morning. 08/11/19  Yes [provider]  levothyroxine (SYNTHROID) 100 MCG tablet Take 1 tablet by mouth daily.   Yes [provider]  liothyronine (CYTOMEL) 5 MCG tablet Take 5 mcg by mouth in the morning and at bedtime. (0700 & 1200)   Yes [provider]  ZETIA 10 MG tablet Take 10 mg by mouth in the morning. 07/06/13  Yes [provider]  EPINEPHrine 0.3 mg/0.3 mL IJ SOAJ injection Inject 0.3 mg into the muscle as needed for anaphylaxis. As needed for life-threatening allergic reactions Patient not taking: Reported on 08/23/2022 07/08/20   Jiles Prows, MD  HYDROcodone-acetaminophen (NORCO) 10-325 MG tablet Take 1 tablet by mouth every 6 (six) hours as needed. Patient not taking: Reported on 08/23/2022 02/18/22   Netta Cedars, MD  LAGEVRIO 200 MG CAPS capsule SMARTSIG:4 Capsule(s) By Mouth Every 12 Hours Patient not taking: Reported on 08/23/2022 08/17/22   [provider]  levalbuterol Penne Lash HFA) 45 MCG/ACT inhaler Inhale 1-2 puffs into the lungs every 4 (four) hours as needed for wheezing. Patient not taking: Reported on 08/23/2022 08/17/22   Jiles Prows, MD  montelukast (SINGULAIR) 10 MG tablet TAKE 1 TABLET BY MOUTH DAILY AT BEDTIME AS NEEDED Patient not taking: Reported on 08/23/2022 01/28/22   Jiles Prows, MD    Physical Exam: Vitals:   08/23/22 2045 08/23/22 2100 08/23/22 2127 08/23/22 2130  BP:  138/70 124/68 131/73 130/73  Pulse: 77 66 68 85  Resp: '18 18 14 17  '$ Temp:    97.7 F (36.5 C)  TempSrc:      SpO2: 96% 94% 96% 97%    Constitutional: NAD, no pallor or diaphoresis   Eyes: PERTLA, lids and conjunctivae normal ENMT: Mucous membranes are dry. Posterior pharynx clear of any exudate or lesions.   Neck: supple, no masses  Respiratory: no wheezing, no crackles. No accessory muscle use.  Cardiovascular: S1 & S2 heard, regular rate and rhythm. No extremity edema.   Abdomen: No distension, no tenderness, soft. Bowel sounds active.  Musculoskeletal: no clubbing / cyanosis. No joint deformity upper and lower extremities.   Skin: no significant rashes, lesions, ulcers. Warm, dry, well-perfused. Neurologic: Gross hearing deficit. Moving all extremities. Alert and oriented.  Psychiatric: Calm. Cooperative.    Labs and Imaging on Admission: I have personally reviewed following labs and imaging studies  CBC: Recent Labs  Lab 08/23/22 1917  WBC 13.9*  NEUTROABS 11.9*  HGB 15.4*  HCT 40.2  MCV 83.4  PLT 355   Basic Metabolic Panel: Recent Labs  Lab 08/23/22 1917  NA 111*  K 2.9*  CL 72*  CO2 25  GLUCOSE 164*  BUN 11  CREATININE 0.62  CALCIUM 9.7  MG 1.5*   GFR: Estimated Creatinine Clearance: 54.5 mL/min (by C-G formula based on SCr of 0.62 mg/dL). Liver Function Tests: Recent Labs  Lab 08/23/22 1917  AST 30  ALT 23  ALKPHOS 82  BILITOT 1.6*  PROT 7.5  ALBUMIN 4.3   No results for input(s): "LIPASE", "AMYLASE" in the last 168 hours. No results for input(s): "AMMONIA" in the last 168 hours. Coagulation Profile: No results for input(s): "INR", "PROTIME" in the last 168 hours. Cardiac Enzymes: No results for input(s): "CKTOTAL", "CKMB", "CKMBINDEX", "TROPONINI" in the last 168 hours. BNP (last 3 results) No results for input(s): "PROBNP" in the last 8760 hours. HbA1C: No results for input(s): "HGBA1C" in the last 72 hours. CBG: No results for  input(s): "GLUCAP" in the last 168 hours. Lipid Profile: No results for input(s): "CHOL", "HDL", "LDLCALC", "TRIG", "CHOLHDL", "LDLDIRECT" in the last 72 hours. Thyroid Function Tests: No results for input(s): "TSH", "  T4TOTAL", "FREET4", "T3FREE", "THYROIDAB" in the last 72 hours. Anemia Panel: No results for input(s): "VITAMINB12", "FOLATE", "FERRITIN", "TIBC", "IRON", "RETICCTPCT" in the last 72 hours. Urine analysis: No results found for: "COLORURINE", "APPEARANCEUR", "LABSPEC", "PHURINE", "GLUCOSEU", "HGBUR", "BILIRUBINUR", "KETONESUR", "PROTEINUR", "UROBILINOGEN", "NITRITE", "LEUKOCYTESUR" Sepsis Labs: '@LABRCNTIP'$ (procalcitonin:4,lacticidven:4) )No results found for this or any previous visit (from the past 240 hour(s)).   Radiological Exams on Admission: No results found.   Assessment/Plan   1. Hyponatremia  - Serum sodium 111 in setting of HCTZ use and N/V/D with hypovolemia  - She does not have any severe symptoms  - Appreciate nephrology recommendations, plan to hold HCTZ, continue NS at 150 mL/hr, check urine creatinine and sodium, check osmolality, and follow serial sodium levels    2. Hypokalemia  - Replacing   3. COVID-19  - Developed sxs 08/14/22, tested positive 08/17/22, was treated with Paxlovid and then switched to molnupiravir  - While in hospital, continue isolation for 10 days from positive test   4. Asthma  - Not in exacerbation on admission   - Continue Singulair and prn SABA   5. Hypothyroidism  - Continue Synthroid and Cytomel    6. Hypertension  - Hold HCTZ and treat as-needed only for now    DVT prophylaxis: Lovenox  Code Status: Full  Level of Care: Level of care: Stepdown Family Communication: Husband at bedside   Disposition Plan:  Patient is from: home  Anticipated d/c is to: TBD Anticipated d/c date is: 08/26/22  Patient currently: Pending improved sodium  Consults called: nephrology  Admission status: Inpatient     Vianne Bulls,  MD [facility]  08/23/2022, 10:16 PM

## 2022-08-23 NOTE — ED Provider Notes (Signed)
[facility] DEPT Provider Note   CSN: [id number] Arrival date & time: 08/23/22  1721     History {Add pertinent medical, surgical, social history, OB history to HPI:1} Chief Complaint  Patient presents with   abnormal labs    Emily Maxwell is a 81 y.o. female.  She has a history of renal cancer and had a nephrectomy.  She said she had COVID a little over a week ago, initially tried Paxlovid and needed to be switched to [employer].  She was having trouble with decreased urine output nausea vomiting and diarrhea.  She stopped the medicine 4 days ago.  She still not having much urine and still having nausea vomiting and diarrhea.  Saw her PCP today and had a negative COVID and flu test.  Found to have a very low sodium and recommended come here for further evaluation.  She said she has been very fatigued.  No seizures.  She has been trying to hydrate with water and Gatorade and Pedialyte.  The history is provided by the patient and the spouse.  Weakness Severity:  Severe Onset quality:  Gradual Duration:  1 week Timing:  Constant Progression:  Worsening Chronicity:  New Context: recent infection   Relieved by:  Nothing Worsened by:  Activity Ineffective treatments:  Drinking fluids Associated symptoms: diarrhea, difficulty walking, headaches, nausea and vomiting   Associated symptoms: no abdominal pain, no loss of consciousness and no shortness of breath   Risk factors: new medications        Home Medications Prior to Admission medications   Medication Sig Start Date End Date Taking? Authorizing Provider  acetaminophen (TYLENOL) 500 MG tablet Take 500-1,000 mg by mouth every 6 (six) hours as needed (pain.).    [provider]  Ascorbic Acid (VITAMIN C PO) Take 1 tablet by mouth every evening.    [provider]  augmented betamethasone dipropionate (DIPROLENE-AF) 0.05 % cream Apply 1 application. topically 2 (two) times daily as  needed. 01/08/22   [provider]  Barberry-Oreg Grape-Goldenseal (BERBERINE COMPLEX PO) Take 2 capsules by mouth in the morning. Berberine 450 mg/capsule    [provider]  BIOTIN PO Take 1 tablet by mouth in the morning.    [provider]  Cholecalciferol (VITAMIN D PO) Take 5,000 Units by mouth in the morning.    [provider]  CINNAMON PO Take 1 capsule by mouth in the morning.    [provider]  Cyanocobalamin (VITAMIN B-12 PO) Take 1 tablet by mouth in the morning. Gummy    [provider]  docusate sodium (COLACE) 100 MG capsule Take 100 mg by mouth daily as needed (constipation.).    [provider]  doxycycline (VIBRA-TABS) 100 MG tablet Take 100 mg by mouth daily.    [provider]  EPINEPHrine 0.3 mg/0.3 mL IJ SOAJ injection Inject 0.3 mg into the muscle as needed for anaphylaxis. As needed for life-threatening allergic reactions 07/08/20   Kozlow, Donnamarie Poag, MD  fexofenadine (ALLEGRA) 180 MG tablet Take 180 mg by mouth daily as needed for allergies.    [provider]  FOLIC ACID PO Take 1 tablet by mouth in the morning.    [provider]  gabapentin (NEURONTIN) 100 MG capsule Take 100 mg by mouth daily as needed (restless leg syndrome). 09/28/21   [provider]  gabapentin (NEURONTIN) 100 MG capsule Take 1 capsule by mouth at bedtime.    [provider]  Glucosamine-Chondroitin (COSAMIN  DS PO) Take 1 tablet by mouth in the morning. Move Free Ultra    [provider]  guaiFENesin (MUCINEX) 600 MG 12 hr tablet Take 1,200 mg by mouth 2 (two) times daily as needed for cough or to loosen phlegm.     [provider]  hydrochlorothiazide (HYDRODIURIL) 12.5 MG tablet Take 12.5 mg by mouth in the morning. 08/11/19   [provider]  HYDROcodone-acetaminophen (NORCO) 10-325 MG tablet Take 1 tablet by mouth every 6 (six) hours as needed. 02/18/22   Netta Cedars,  MD  KRILL OIL PO Take 1 capsule by mouth in the morning.    [provider]  levalbuterol Penne Lash HFA) 45 MCG/ACT inhaler Inhale 1-2 puffs into the lungs every 4 (four) hours as needed for wheezing. 08/17/22   Kozlow, Donnamarie Poag, MD  levothyroxine (SYNTHROID) 100 MCG tablet Take 1 tablet by mouth daily.    [provider]  levothyroxine (SYNTHROID, LEVOTHROID) 100 MCG tablet Take 100 mcg by mouth daily at 6 (six) AM. (0700)    [provider]  liothyronine (CYTOMEL) 5 MCG tablet Take 5 mcg by mouth in the morning and at bedtime. (0700 & 1200)    [provider]  loratadine (CLARITIN) 10 MG tablet Take 10 mg by mouth in the morning.    [provider]  MAGNESIUM MALATE PO Take 1,350 mg by mouth at bedtime.    [provider]  Menaquinone-7 (K2 PO) Take 1 tablet by mouth in the morning.    [provider]  metroNIDAZOLE (METROCREAM) 0.75 % cream Apply 1 application. topically daily as needed (rosacea). 02/10/18   [provider]  montelukast (SINGULAIR) 10 MG tablet TAKE 1 TABLET BY MOUTH DAILY AT BEDTIME AS NEEDED 01/28/22   Kozlow, Donnamarie Poag, MD  Multiple Vitamins-Minerals (ADULT ONE DAILY GUMMIES PO) Take 2 tablets by mouth in the morning.    [provider]  Nutritional Supplements (GRAPESEED EXTRACT PO) Take 1 capsule by mouth in the morning.    [provider]  Omega-3 Fatty Acids (FISH OIL PO) Take 1 capsule by mouth in the morning.    [provider]  PRESCRIPTION MEDICATION Allergy shots 2 x per week either or Tuesday, Wednesday, or Thursday    [provider]  Probiotic Product (ALIGN) CHEW Chew 2 tablets by mouth in the morning. Gummy    [provider]  TURMERIC PO Take 1,000 mg by mouth in the morning.    [provider]  VITAMIN E PO Take 450 mg by mouth in the morning.    [provider]  Wheat Dextrin (BENEFIBER PO) Take 0.5 packets by mouth in the morning and  at bedtime.    [provider]  ZETIA 10 MG tablet Take 10 mg by mouth in the morning. 07/06/13   [provider]      Allergies    Ketorolac tromethamine, Avelox [moxifloxacin hcl in nacl], Epinephrine, Other, Sulfamethoxazole-trimethoprim, Tramadol, Penicillins, and Sulfa antibiotics    Review of Systems   Review of Systems  Respiratory:  Negative for shortness of breath.   Gastrointestinal:  Positive for diarrhea, nausea and vomiting. Negative for abdominal pain.  Neurological:  Positive for weakness and headaches. Negative for loss of consciousness.    Physical Exam Updated Vital Signs BP 138/70   Pulse 77   Temp 97.7 F (36.5 C) (Oral)   Resp 18   LMP 07/11/1983   SpO2 96%  Physical Exam Vitals and nursing note reviewed.  Constitutional:      General: She is not in acute distress.    Appearance: Normal appearance. She is well-developed.  HENT:     Head: Normocephalic and atraumatic.  Eyes:     Conjunctiva/sclera: Conjunctivae normal.  Cardiovascular:     Rate and Rhythm: Normal rate and regular rhythm.     Heart sounds: No murmur heard. Pulmonary:     Effort: Pulmonary effort is normal. No respiratory distress.     Breath sounds: Normal breath sounds.  Abdominal:     Palpations: Abdomen is soft.     Tenderness: There is no abdominal tenderness. There is no guarding or rebound.  Musculoskeletal:        General: No deformity. Normal range of motion.     Cervical back: Neck supple.  Skin:    General: Skin is warm and dry.     Capillary Refill: Capillary refill takes less than 2 seconds.  Neurological:     General: No focal deficit present.     Mental Status: She is alert and oriented to person, place, and time.     Sensory: No sensory deficit.     Motor: No weakness.     ED Results / Procedures / Treatments   Labs (all labs ordered are listed, but only abnormal results are displayed) Labs Reviewed  COMPREHENSIVE METABOLIC PANEL - Abnormal;  Notable for the following components:      Result Value   Sodium 111 (*)    Potassium 2.9 (*)    Chloride 72 (*)    Glucose, Bld 164 (*)    Total Bilirubin 1.6 (*)    All other components within normal limits  CBC WITH DIFFERENTIAL/PLATELET - Abnormal; Notable for the following components:   WBC 13.9 (*)    Hemoglobin 15.4 (*)    MCHC 38.3 (*)    RDW 10.6 (*)    Neutro Abs 11.9 (*)    All other components within normal limits  URINALYSIS, ROUTINE W REFLEX MICROSCOPIC  SODIUM, URINE, RANDOM  CREATININE, URINE, RANDOM  MAGNESIUM    EKG None  Radiology No results found.  Procedures Procedures  {Document cardiac monitor, telemetry assessment procedure when appropriate:1}  Medications Ordered in ED Medications - No data to display  ED Course/ Medical Decision Making/ A&P                           Medical Decision Making Amount and/or Complexity of Data Reviewed Labs: ordered.   This patient complains of ***; this involves an extensive number of treatment Options and is a complaint that carries with it a high risk of complications and morbidity. The differential includes ***  I ordered, reviewed and interpreted labs, which included *** I ordered medication *** and reviewed PMP when indicated. I ordered imaging studies which included *** and I independently    visualized and interpreted imaging which showed *** Additional history obtained from *** Previous records obtained and reviewed *** I consulted *** and discussed lab and imaging findings and discussed disposition.  Cardiac monitoring reviewed, *** Social determinants considered, *** Critical Interventions: ***  After the interventions stated above, I reevaluated the patient and found *** Admission and further testing considered, ***   {Document critical care time when appropriate:1} {Document review of labs and clinical decision tools ie heart score, Chads2Vasc2 etc:1}  {Document your independent review  of radiology images, and any outside records:1} {Document your discussion with family members, caretakers, and with  consultants:1} {Document social determinants of health affecting pt's care:1} {Document your decision making why or why not admission, treatments were needed:1} Final Clinical Impression(s) / ED Diagnoses Final diagnoses:  None    Rx / DC Orders ED Discharge Orders     None

## 2022-08-23 NOTE — ED Notes (Signed)
IV removed, as it appears infiltrated and is causing some pain above site. Will continue to monitor

## 2022-08-24 DIAGNOSIS — Z8616 Personal history of COVID-19: Secondary | ICD-10-CM | POA: Diagnosis not present

## 2022-08-24 DIAGNOSIS — E876 Hypokalemia: Secondary | ICD-10-CM | POA: Diagnosis not present

## 2022-08-24 DIAGNOSIS — E871 Hypo-osmolality and hyponatremia: Secondary | ICD-10-CM | POA: Diagnosis not present

## 2022-08-24 DIAGNOSIS — I10 Essential (primary) hypertension: Secondary | ICD-10-CM | POA: Diagnosis not present

## 2022-08-24 LAB — CBC
HCT: 42.2 % (ref 36.0–46.0)
Hemoglobin: 15.8 g/dL — ABNORMAL HIGH (ref 12.0–15.0)
MCH: 31.7 pg (ref 26.0–34.0)
MCHC: 37.4 g/dL — ABNORMAL HIGH (ref 30.0–36.0)
MCV: 84.7 fL (ref 80.0–100.0)
Platelets: 210 10*3/uL (ref 150–400)
RBC: 4.98 MIL/uL (ref 3.87–5.11)
RDW: 10.9 % — ABNORMAL LOW (ref 11.5–15.5)
WBC: 8.3 10*3/uL (ref 4.0–10.5)
nRBC: 0 % (ref 0.0–0.2)

## 2022-08-24 LAB — SODIUM, URINE, RANDOM: Sodium, Ur: 66 mmol/L

## 2022-08-24 LAB — SODIUM
Sodium: 109 mmol/L — CL (ref 135–145)
Sodium: 110 mmol/L — CL (ref 135–145)
Sodium: 117 mmol/L — CL (ref 135–145)
Sodium: 125 mmol/L — ABNORMAL LOW (ref 135–145)

## 2022-08-24 LAB — BASIC METABOLIC PANEL
Anion gap: 16 — ABNORMAL HIGH (ref 5–15)
BUN: 11 mg/dL (ref 8–23)
CO2: 25 mmol/L (ref 22–32)
Calcium: 9.7 mg/dL (ref 8.9–10.3)
Chloride: 81 mmol/L — ABNORMAL LOW (ref 98–111)
Creatinine, Ser: 0.79 mg/dL (ref 0.44–1.00)
GFR, Estimated: >60 mL/min (ref >60)
Glucose, Bld: 126 mg/dL — ABNORMAL HIGH (ref 70–99)
Potassium: 3.5 mmol/L (ref 3.5–5.1)
Sodium: 122 mmol/L — ABNORMAL LOW (ref 135–145)

## 2022-08-24 LAB — URINALYSIS, ROUTINE W REFLEX MICROSCOPIC
Bacteria, UA: NONE SEEN
Bilirubin Urine: NEGATIVE
Glucose, UA: NEGATIVE mg/dL
Hgb urine dipstick: NEGATIVE
Ketones, ur: 20 mg/dL — AB
Leukocytes,Ua: NEGATIVE
Nitrite: NEGATIVE
Protein, ur: 30 mg/dL — AB
Specific Gravity, Urine: 1.015 (ref 1.005–1.030)
pH: 7 (ref 5.0–8.0)

## 2022-08-24 LAB — BASIC METABOLIC PANEL WITH GFR
Anion gap: 8 (ref 5–15)
BUN: 10 mg/dL (ref 8–23)
CO2: 26 mmol/L (ref 22–32)
Calcium: 8.7 mg/dL — ABNORMAL LOW (ref 8.9–10.3)
Chloride: 77 mmol/L — ABNORMAL LOW (ref 98–111)
Creatinine, Ser: 0.68 mg/dL (ref 0.44–1.00)
GFR, Estimated: >60 mL/min
Glucose, Bld: 132 mg/dL — ABNORMAL HIGH (ref 70–99)
Potassium: 3.3 mmol/L — ABNORMAL LOW (ref 3.5–5.1)
Sodium: 111 mmol/L — CL (ref 135–145)

## 2022-08-24 LAB — MRSA NEXT GEN BY PCR, NASAL: MRSA by PCR Next Gen: NOT DETECTED

## 2022-08-24 LAB — OSMOLALITY: Osmolality: 236 mosm/kg — CL (ref 275–295)

## 2022-08-24 LAB — CREATININE, URINE, RANDOM: Creatinine, Urine: 93 mg/dL

## 2022-08-24 LAB — TSH: TSH: 0.659 u[IU]/mL (ref 0.350–4.500)

## 2022-08-24 LAB — MAGNESIUM: Magnesium: 1.9 mg/dL (ref 1.7–2.4)

## 2022-08-24 MED ORDER — ORAL CARE MOUTH RINSE: 15.0000 mL | OROMUCOSAL | Status: DC | PRN

## 2022-08-24 MED ORDER — SODIUM CHLORIDE 3 % IV BOLUS
100.0000 mL | Freq: Once | INTRAVENOUS | Status: AC
  Administered 2022-08-24: 100 mL via INTRAVENOUS
  Filled 2022-08-24: qty 500

## 2022-08-24 MED ORDER — LIOTHYRONINE SODIUM 5 MCG PO TABS
5.0000 ug | ORAL_TABLET | Freq: Two times a day (BID) | ORAL | Status: DC
  Administered 2022-08-24: 5 ug via ORAL
  Filled 2022-08-24 (×2): qty 1

## 2022-08-24 MED ORDER — ONDANSETRON HCL 4 MG/2ML IJ SOLN: 4.0000 mg | Freq: Four times a day (QID) | INTRAMUSCULAR | Status: DC | PRN

## 2022-08-24 MED ORDER — DOCUSATE SODIUM 50 MG PO CAPS
50.0000 mg | ORAL_CAPSULE | Freq: Once | ORAL | Status: AC
  Administered 2022-08-24: 50 mg via ORAL
  Filled 2022-08-24: qty 1

## 2022-08-24 MED ORDER — PROCHLORPERAZINE EDISYLATE 10 MG/2ML IJ SOLN
10.0000 mg | Freq: Once | INTRAMUSCULAR | Status: AC | PRN
  Administered 2022-08-24: 10 mg via INTRAVENOUS
  Filled 2022-08-24: qty 2

## 2022-08-24 MED ORDER — GABAPENTIN 100 MG PO CAPS
100.0000 mg | ORAL_CAPSULE | Freq: Every day | ORAL | Status: DC | PRN
  Administered 2022-08-24 – 2022-08-25 (×2): 100 mg via ORAL
  Filled 2022-08-24 (×2): qty 1

## 2022-08-24 MED ORDER — CHLORHEXIDINE GLUCONATE CLOTH 2 % EX PADS
6.0000 | MEDICATED_PAD | Freq: Every day | CUTANEOUS | Status: DC
  Administered 2022-08-24 – 2022-08-25 (×2): 6 via TOPICAL

## 2022-08-24 NOTE — Plan of Care (Signed)
  Problem: Safety: Goal: Ability to remain free from injury will improve Outcome: Progressing   Problem: Pain Managment: Goal: General experience of comfort will improve Outcome: Progressing   

## 2022-08-24 NOTE — Consult Note (Signed)
Reason for Consult: Hyponatremia (acute on chronic) Referring Physician: Hosie Poisson MD [facility])  HPI:  81 year old woman with past medical history significant for hypertension, history of renal cell cancer status post left nephroureterectomy, rosacea (on doxycycline), hypothyroidism and what appears to be stable chronic hyponatremia ranging 130-133 at baseline while on HCTZ.  Presented to the emergency room yesterday with worsening weakness, nausea, vomiting and diarrhea and outpatient labs showing hyponatremia of 111.  1 week ago, she was diagnosed with COVID-19 and started on Paxlovid and transitioned to molnupiravir but did not complete its course after experiencing improvement of her upper respiratory symptoms.  She unfortunately has had poor appetite/oral intake but was able to drink fluids (which she often promptly vomited up per her husband).  She did not have any fevers or chills prior to admission.  Her husband reports that he had noted with concern that her urine output had significantly reduced.  Labs in the emergency room were significant for sodium 111, potassium 2.9, bicarbonate 25, BUN 11, creatinine 0.6.  Serum osmolality 236 with urine specific gravity of 1.015.  Urine sodium 66, urine creatinine 93 and urine osmolality pending.  She received some normal saline overnight without significant improvement of sodium level and earlier this morning got 100 cc bolus of 3% saline with sodium now up to 117.  Past Medical History:  Diagnosis Date   Aneurysm, ascending aorta ([place]) 2013   85m   Anxiety    hx of  anxiety , had stress test several years ago per patient that was normal    Arthritis    thumbs , oa   Ascending aorta dilation ([facility]) 08/07/2012   Ast[place]    [place]   [place]    Had [place] removed   Complication of anesthesia    pulse rate slow in past, did ok with recent anesthesia   Deaf    left ear   Dysrhythmia     occasional arrythmia, no meds taken for   Hematuria    no blood seen recently   Hematuria    History of fatty infiltration of liver    History of hiatal hernia    Hyperlipidemia    Hyperplastic colon polyp    Hypertension    Hypothyroidism    IFG (impaired fasting glucose)    Nodule of left lung    stable, surveillance  Dr SBrigitte Pulse  Numbness    left thigh, when walking or standing for long periods of time   Osteopenia    RLS (restless legs syndrome)    Rosacea    Simple endometrial hyperplasia years ago   [place]acic [place]    stable per last chest ct 07-11-14 [facility] on chart-    Vasovagal reaction    hx of, triggers after eating once you have fasted    Past Surgical History:  Procedure Laterality Date   acoustic neuroma S/P resection Lt ear deafness 1990     tinnitus  from left ear   BTL     BUNIONECTOMY Bilateral    x 2   CATARACT EXTRACTION Bilateral 2004   CRANIECTOMY FOR EXCISION OF ACOUSTIC NEUROMA Left 1990   Deaf on Left side   CYSTOSCOPY WITH RETROGRADE PYELOGRAM, URETEROSCOPY AND STENT PLACEMENT Bilateral 08/07/2015   Procedure: CYSTOSCOPY WITH BILATERAL RETROGRADE PYELOGRAM, LEFT URETEROSCOPY WITH BIOPSY AND  LEFT STENT PLACEMENT;  Surgeon: TAlexis Frock MD;  Location: [facility];  Service: Urology;  Laterality: Bilateral;   EXCISION MORTON'S NEUROMA  2010   left foot 3rd and 4th toe   EYE SURGERY Bilateral 2013   eyelid drooping fixed   EYE SURGERY Bilateral    yag procedure after cataracts   left hand middle finger surgery   2015   cadaver bone placed    left shoulder surgery 2005     Lt knee surgery 2008 Left 2008   tears twice one surgery   REVERSE SHOULDER ARTHROPLASTY Left 02/18/2022   Procedure: REVERSE SHOULDER ARTHROPLASTY;  Surgeon: Netta Cedars, MD;  Location: [facility];  Service: Orthopedics;  Laterality: Left;  with ISB   ROBOT ASSITED LAPAROSCOPIC NEPHROURETERECTOMY Left 09/19/2015   Procedure: ROBOT ASSITED LAPAROSCOPIC  NEPHROURETERECTOMY WITH DIAPHRAMATIC HERNIA REPAIR;  Surgeon: Alexis Frock, MD;  Location: [facility];  Service: Urology;  Laterality: Left;   SP CHOLECYSTOMY     THYROIDECTOMY  11/2009   S/P Adenoma follicular vs hyperplastic   TONSILLECTOMY  age 60   TUBAL LIGATION  1976   WRIST SURGERY Left 2007   wrist cyst removed    Family History  Problem Relation Age of Onset   Cancer Father    Cancer Maternal Grandmother    Heart disease Maternal Grandfather    Stroke Maternal Grandfather    Hypertension Mother     Social History:  reports that she has never smoked. She has never used smokeless tobacco. She reports current alcohol use. She reports that she does not use drugs.  Allergies:  Allergies  Allergen Reactions   Ketorolac Tromethamine Palpitations   Avelox [Moxifloxacin Hcl In Nacl] Other (See Comments)    Unable to describe reaction   Epinephrine Other (See Comments)    Makes heart rate increase and cold sweats   Other Other (See Comments)   Sulfamethoxazole-Trimethoprim Other (See Comments)   Tramadol Other (See Comments)    Unable to describe reaction   Penicillins Rash    Childhood rxn from weekly PCN shots w/ asthma Tx Has patient had a PCN reaction causing immediate rash, facial/tongue/throat swelling, SOB or lightheadedness with hypotension: unknown Has patient had a PCN reaction causing severe rash involving mucus membranes or skin necrosis: unknown Has patient had a PCN reaction that required hospitalization: no   Has patient had a PCN reaction occurring within the last 10 years: no If all of the above answers are "NO", then may proceed with Cephalosporin   Sulfa Antibiotics Rash    Tolerates non-antibiotic sulfa drugs    Medications: I have reviewed the patient's current medications. Scheduled:  doxycycline  100 mg Oral Daily   enoxaparin (LOVENOX) injection  40 mg Subcutaneous Q24H   levothyroxine  100 mcg Oral Daily   liothyronine  5 mcg Oral BID   sodium  chloride flush  3 mL Intravenous Q12H   Continuous:     Latest Ref Rng & Units 08/24/2022    9:28 AM 08/24/2022    5:39 AM 08/24/2022    1:46 AM  BMP  Glucose 70 - 99 mg/dL  132    BUN 8 - 23 mg/dL  10    Creatinine 0.44 - 1.00 mg/dL  0.68    Sodium 135 - 145 mmol/L 117  111  109   Potassium 3.5 - 5.1 mmol/L  3.3    Chloride 98 - 111 mmol/L  77    CO2 22 - 32 mmol/L  26    Calcium 8.9 - 10.3 mg/dL  8.7  Latest Ref Rng & Units 08/24/2022    9:28 AM 08/23/2022    7:17 PM 02/19/2022    3:40 AM  CBC  WBC 4.0 - 10.5 K/uL 8.3  13.9    Hemoglobin 12.0 - 15.0 g/dL 15.8  15.4  10.9   Hematocrit 36.0 - 46.0 % 42.2  40.2  32.1   Platelets 150 - 400 K/uL 210  292       No results found.  Review of Systems  Constitutional:  Positive for activity change, appetite change and fatigue. Negative for fever.  HENT:  Positive for congestion and sore throat. Negative for nosebleeds and trouble swallowing.   Eyes:  Negative for photophobia and visual disturbance.  Respiratory:  Positive for cough. Negative for chest tightness, shortness of breath and wheezing.   Cardiovascular:  Negative for chest pain and leg swelling.  Gastrointestinal:  Positive for diarrhea, nausea and vomiting. Negative for abdominal pain.  Endocrine: Negative for polydipsia and polyuria.  Genitourinary:  Positive for decreased urine volume. Negative for dysuria, hematuria and urgency.  Musculoskeletal:  Negative for back pain and myalgias.  Skin:  Negative for pallor and rash.  Neurological:  Positive for weakness. Negative for dizziness and light-headedness.   Blood pressure (!) 123/57, pulse 66, temperature 98.4 F (36.9 C), temperature source Oral, resp. rate 12, last menstrual period 07/11/1983, SpO2 97 %. Physical Exam Vitals and nursing note reviewed.  Constitutional:      Appearance: She is obese. She is ill-appearing.     Comments: Somnolent but awakens to conversation  HENT:     Head: Normocephalic and  atraumatic.     Right Ear: External ear normal.     Left Ear: External ear normal.     Nose: Nose normal. No congestion.     Mouth/Throat:     Mouth: Mucous membranes are dry.     Pharynx: Oropharynx is clear.  Eyes:     Extraocular Movements: Extraocular movements intact.     Conjunctiva/sclera: Conjunctivae normal.  Cardiovascular:     Rate and Rhythm: Normal rate and regular rhythm.     Pulses: Normal pulses.     Heart sounds: Normal heart sounds.  Pulmonary:     Effort: Pulmonary effort is normal.     Breath sounds: Normal breath sounds. No wheezing or rales.  Abdominal:     General: Abdomen is flat. Bowel sounds are normal.     Palpations: Abdomen is soft.     Tenderness: There is no abdominal tenderness.  Musculoskeletal:     Cervical back: Normal range of motion and neck supple. No rigidity.     Right lower leg: No edema.     Left lower leg: No edema.  Skin:    General: Skin is warm and dry.     Coloration: Skin is not pale.     Assessment/Plan: 1.  Acute on chronic hyponatremia: Euvolemic to possibly hypovolemic with true hypoosmolar hyponatremia.  Her underlying/baseline hyponatremia is likely from ongoing thiazide use and her recent respiratory illness with associated nausea/vomiting and hypotonic fluids likely induced her acute hyponatremia.  I agree with discontinuing hydrochlorothiazide at this time and administration of normal saline for intravascular volume expansion.  I will follow sodium levels closely and if these do not continue to trend up, begin her on hypertonic saline to help get sodium levels >120 and limit its neurological impact.  I will check a TSH level given her underlying history of hypothyroidism to verify that this is not an  additional factor for hyponatremia. 2.  Hypokalemia: Likely secondary to preceding thiazide use and limited intake/GI losses.  This has been replaced via oral/intravenous routes. 3.  Hypertension: Pressure is currently at  acceptable range, monitor off thiazide. 4.  Polycythemia: Likely from hemoconcentration in the setting of volume contraction, continue to follow with intravenous fluids.  Meranda Dechaine K. 08/24/2022, 12:25 PM

## 2022-08-24 NOTE — ED Notes (Signed)
Pt vomiting, Dr. Myna Hidalgo notified. New 18G started to [facility], fluids restarted

## 2022-08-24 NOTE — Progress Notes (Signed)
[facility]                                                                               Emily Maxwell, is a 81 y.o. female, DOB - 25-Dec-1940, IPJ:[id number] Admit date - 08/23/2022    Outpatient Primary MD for the patient is Brigitte Pulse Emily Maxwell., MD  LOS - 1  days    Brief summary    Emily Maxwell is a 81 y.o. female with medical history significant for asthma, urothelial carcinoma status post left nephroureterectomy in 2016, hypertension, hypothyroidism, on long-term doxycycline for rosacea, and recent COVID-19 infection who presents to the emergency department with nausea, vomiting, diarrhea, and low sodium on outpatient blood work.   Nephrology was consulted by the ED physician and the patient was started on normal saline infusion and given IV potassium replacement.   Assessment & Plan    Assessment and Plan:    Acute Hyponatremia:  Hypovolemic, hypo osmolar hyponatremia, worsened by HCTZ use.  D/c HCTZ, was started on IV NS without much improvement, She received 135m of 3% saline.  Appreciate nephrology assistance.  TSH, free t4 and free t3 ordered, am cortisol ordered.  Sodium has improved to 117.     Leukocytosis  Suspect reactive.     Hypokalemia Replaced.     Hypothyroidism;  Resume home meds.   Asthma No wheezing heard.    Hypertension:  BP parameters are optimal.     COVID 19 INFECTION.  Developed sxs 08/14/22, tested positive 08/17/22, was treated with Paxlovid and then switched to molnupiravir  - While in hospital, continue isolation for 10 days from positive test     Estimated body mass index is 30.4 kg/m as calculated from the following:   Height as of 08/17/22: '5\' 3"'$  (1.6 m).   Weight as of 08/17/22: 77.8 kg.  Code Status: full code.  DVT Prophylaxis:  enoxaparin (LOVENOX) injection 40 mg Start: 08/23/22 2230   Level of Care: Level of care: Stepdown Family Communication: none at bedside.   Disposition Plan:      Remains inpatient appropriate:  Hyponatremia.   Procedures:  None.   Consultants:   Nephrology.   Antimicrobials:   Anti-infectives (From admission, onward)    Start     Dose/Rate Route Frequency Ordered Stop   08/24/22 1000  doxycycline (VIBRA-TABS) tablet 100 mg        100 mg Oral Daily 08/23/22 2216          Medications  Scheduled Meds:  doxycycline  100 mg Oral Daily   enoxaparin (LOVENOX) injection  40 mg Subcutaneous Q24H   levothyroxine  100 mcg Oral Daily   liothyronine  5 mcg Oral BID   sodium chloride flush  3 mL Intravenous Q12H   Continuous Infusions: PRN Meds:.acetaminophen **OR** acetaminophen, albuterol, gabapentin, guaiFENesin-dextromethorphan, labetalol, ondansetron (ZOFRAN) IV    Subjective:   Emily Maxwell seen and examined today.  Nausea, vomiting has improved.   Objective:   Vitals:   08/24/22 0540 08/24/22 0845 08/24/22 0935 08/24/22 1215  BP: (!) 117/105 (!) 123/57  (!) 105/52  Pulse: 82 66  62  Resp: '13 12  13  '$ Temp:  98.4 F (36.9 C)   TempSrc:   Oral   SpO2: 95% 97%  98%    Intake/Output Summary (Last 24 hours) at 08/24/2022 1302 Last data filed at 08/24/2022 0831 Gross per 24 hour  Intake 300 ml  Output --  Net 300 ml   There were no vitals filed for this visit.   Exam General: Alert and oriented x 3, NAD Cardiovascular: S1 S2 auscultated, no murmurs, RRR Respiratory: Clear to auscultation bilaterally, no wheezing, rales or rhonchi Gastrointestinal: Soft, nontender, nondistended, + bowel sounds Ext: no pedal edema bilaterally Neuro: AAOx3, Cr N's II- XII. Strength 5/5 upper and lower extremities bilaterally Skin: No rashes Psych: Normal affect and demeanor, alert and oriented x3    Data Reviewed:  I have personally reviewed following labs and imaging studies   CBC Lab Results  Component Value Date   WBC 8.3 08/24/2022   RBC 4.98 08/24/2022   HGB 15.8 (H) 08/24/2022   HCT 42.2 08/24/2022   MCV 84.7 08/24/2022    MCH 31.7 08/24/2022   PLT 210 08/24/2022   MCHC 37.4 (H) 08/24/2022   RDW 10.9 (L) 08/24/2022   LYMPHSABS 1.0 08/23/2022   MONOABS 0.9 08/23/2022   EOSABS 0.0 08/23/2022   BASOSABS 0.0 56/21/3086     Last metabolic panel Lab Results  Component Value Date   NA 117 (LL) 08/24/2022   K 3.3 (L) 08/24/2022   CL 77 (L) 08/24/2022   CO2 26 08/24/2022   BUN 10 08/24/2022   CREATININE 0.68 08/24/2022   GLUCOSE 132 (H) 08/24/2022   GFRNONAA >60 08/24/2022   GFRAA 49 (L) 09/21/2015   CALCIUM 8.7 (L) 08/24/2022   PROT 7.5 08/23/2022   ALBUMIN 4.3 08/23/2022   BILITOT 1.6 (H) 08/23/2022   ALKPHOS 82 08/23/2022   AST 30 08/23/2022   ALT 23 08/23/2022   ANIONGAP 8 08/24/2022    CBG (last 3)  No results for input(s): "GLUCAP" in the last 72 hours.    Coagulation Profile: No results for input(s): "INR", "PROTIME" in the last 168 hours.   Radiology Studies: No results found.     Hosie Poisson M.D. [facility] 08/24/2022, 1:02 PM  Available via Epic secure chat 7am-7pm After 7 pm, please refer to night coverage provider listed on amion.

## 2022-08-24 NOTE — ED Notes (Signed)
ED TO INPATIENT HANDOFF REPORT  Name/Age/Gender Emily Maxwell 81 y.o. female  Code Status    Code Status Orders  (From admission, onward)           Start     Ordered   08/23/22 2215  Full code  Continuous        08/23/22 2216           Code Status History     Date Active Date Inactive Code Status Order ID Comments User Context   02/18/2022 1544 02/19/2022 1638 Full Code [id number]  Netta Cedars, MD Inpatient   09/19/2015 1712 09/21/2015 1817 Full Code [id number]  Debbrah Alar, PA-C Inpatient      Advance Directive Documentation    Flowsheet Row Most Recent Value  Type of Advance Directive Healthcare Power of [name], Living will  Pre-existing out of facility DNR order (yellow form or pink MOST form) --  "MOST" Form in Place? --       Home/SNF/Other Home  Chief Complaint Hyponatremia [E87.1]  Level of Care/Admitting Diagnosis ED Disposition     ED Disposition  Admit   Condition  --   [name]: [place] [[id number]]  Level of Care: Stepdown [14]  Admit to SDU based on following criteria: Severe physiological/psychological symptoms:  Any diagnosis requiring assessment & intervention at least every 4 hours on an ongoing basis to obtain desired patient outcomes including stability and rehabilitation  May admit patient to [facility] or [facility] if equivalent level of care is available:: Yes  Covid Evaluation: Confirmed COVID Positive  Diagnosis: Hyponatremia [198519]  Admitting Physician: Vianne Bulls [[id number]]  Attending Physician: Vianne Bulls [[id number]]  Certification:: I certify this patient will need inpatient services for at least 2 midnights  Estimated Length of Stay: 3          Medical History Past Medical History:  Diagnosis Date   Aneurysm, ascending aorta ([place]) 2013   52m   Anxiety    hx of  anxiety , had stress test several years ago per patient that was normal    Arthritis    thumbs , oa    [place]ending [place] dilation ([place] 08/07/[place]   Asthma    Cancer ([place]    cancer left kidney   Cancer of kidney ([place]    Had right kidney removed   Complication of anesthesia    pulse rate slow in past, did ok with recent anesthesia   Deaf    left ear   Dysrhythmia    occasional arrythmia, no meds taken for   Hematuria    no blood seen recently   Hematuria    History of fatty infiltration of liver    History of hiatal hernia    Hyperlipidemia    Hyperplastic colon polyp    Hypertension    Hypothyroidism    IFG (impaired fasting glucose)    Nodule of left lung    stable, surveillance  Dr SBrigitte Pulse  Numbness    left thigh, when walking or standing for long periods of time   Osteopenia    RLS (restless legs syndrome)    Rosacea    Simple endometrial hyperplasia years ago   Thoracic aortic aneurysm ([facility] [facility]    stable per last chest ct 07-11-14 [facility] on chart-    Vasovagal reaction    hx of, triggers after eating once you have fasted    Allergies Allergies  Allergen Reactions   Ketorolac Tromethamine Palpitations  Avelox [Moxifloxacin Hcl In [place]] Other (See Comments)    Unable to describe reaction   Epinephrine Other (See Comments)    Makes heart rate increase and cold sweats   Other Other (See Comments)   Sulfamethoxazole-Trimethoprim Other (See Comments)   Tramadol Other (See Comments)    Unable to describe reaction   Penicillins Rash    Childhood rxn from weekly PCN shots w/ asthma Tx Has patient had a PCN reaction causing immediate rash, facial/tongue/throat swelling, SOB or lightheadedness with hypotension: unknown Has patient had a PCN reaction causing severe rash involving mucus membranes or skin necrosis: unknown Has patient had a PCN reaction that required hospitalization: no   Has patient had a PCN reaction occurring within the last 10 years: no If all of the above answers are "NO", then may proceed with Cephalosporin   Sulfa Antibiotics Rash    Tolerates  non-antibiotic sulfa drugs    IV Location/Drains/Wounds Patient Lines/Drains/Airways Status     Active Line/Drains/Airways     Name Placement date Placement time Site Days   Peripheral IV 08/24/22 18 G Anterior;Left;Proximal Forearm 08/24/22  0002  Forearm  less than 1   Peripheral IV 08/24/22 20 G Posterior;Right Hand 08/24/22  0027  Hand  less than 1   Incision (Closed) 02/18/22 Shoulder Left 02/18/22  1401  -- 187            Labs/Imaging Results for orders placed or performed during the hospital encounter of 08/23/22 (from the past 48 hour(s))  Urinalysis, Routine w reflex microscopic Urine, Clean Catch     Status: Abnormal   Collection Time: 08/23/22 12:03 AM  Result Value Ref Range   Color, Urine YELLOW YELLOW   APPearance CLEAR CLEAR   Specific Gravity, Urine 1.015 1.005 - 1.030   pH 7.0 5.0 - 8.0   Glucose, UA NEGATIVE NEGATIVE mg/dL   Hgb urine dipstick NEGATIVE NEGATIVE   Bilirubin Urine NEGATIVE NEGATIVE   Ketones, ur 20 (A) NEGATIVE mg/dL   Protein, ur 30 (A) NEGATIVE mg/dL   Nitrite NEGATIVE NEGATIVE   Leukocytes,Ua NEGATIVE NEGATIVE   RBC / HPF 0-5 0 - 5 RBC/hpf   WBC, UA 0-5 0 - 5 WBC/hpf   Bacteria, UA NONE SEEN NONE SEEN   Squamous Epithelial / LPF 0-5 0 - 5   Mucus PRESENT     Comment: Performed at [facility], [place] [address] [place], [place] [address]  Sodium, urine, random     Status: None   Collection Time: 08/23/22 12:03 AM  Result Value Ref Range   Sodium, Ur 66 mmol/L    Comment: Performed at [facility], [place] [address] [place], [place] [address]  Creatinine, urine, random     Status: None   Collection Time: 08/23/22 12:03 AM  Result Value Ref Range   Creatinine, Urine 93 mg/dL    Comment: Performed at [facility], [place] [address], [place], [place] [address]  Comprehensive metabolic panel     Status: Abnormal   Collection Time: 08/23/22  7:17 PM  Result Value Ref Range   Sodium  111 (LL) 135 - 145 mmol/L    Comment: CRITICAL RESULT CALLED TO, READ BACK BY AND VERIFIED WITH RIVERS .T . RN '@2021'$  ON 08/23/22 BY KERLANDIA C    Potassium 2.9 (L) 3.5 - 5.1 mmol/L   Chloride 72 (L) 98 - 111 mmol/L   CO2 25 22 - 32 mmol/L   Glucose, Bld 164 (H) 70 -  99 mg/dL    Comment: Glucose reference range applies only to samples taken after fasting for at least 8 hours.   BUN 11 8 - 23 mg/dL   Creatinine, Ser 0.62 0.44 - 1.00 mg/dL   Calcium 9.7 8.9 - 10.3 mg/dL   Total Protein 7.5 6.5 - 8.1 g/dL   Albumin 4.3 3.5 - 5.0 g/dL   AST 30 15 - 41 U/L   ALT 23 0 - 44 U/L   Alkaline Phosphatase 82 38 - 126 U/L   Total Bilirubin 1.6 (H) 0.3 - 1.2 mg/dL   GFR, Estimated >60 >60 mL/min    Comment: (NOTE) Calculated using the CKD-EPI Creatinine Equation (2021)    Anion gap 14 5 - 15    Comment: Performed at [facility], [place] [address], [place], [place] [address]  CBC with Differential     Status: Abnormal   Collection Time: 08/23/22  7:17 PM  Result Value Ref Range   WBC 13.9 (H) 4.0 - 10.5 K/uL   RBC 4.82 3.87 - 5.11 MIL/uL   Hemoglobin 15.4 (H) 12.0 - 15.0 g/dL   HCT 40.2 36.0 - 46.0 %   MCV 83.4 80.0 - 100.0 fL   MCH 32.0 26.0 - 34.0 pg   MCHC 38.3 (H) 30.0 - 36.0 g/dL   RDW 10.6 (L) 11.5 - 15.5 %   Platelets 292 150 - 400 K/uL   nRBC 0.0 0.0 - 0.2 %   Neutrophils Relative % 86 %   Neutro Abs 11.9 (H) 1.7 - 7.7 K/uL   Lymphocytes Relative 7 %   Lymphs Abs 1.0 0.7 - 4.0 K/uL   Monocytes Relative 7 %   Monocytes Absolute 0.9 0.1 - 1.0 K/uL   Eosinophils Relative 0 %   Eosinophils Absolute 0.0 0.0 - 0.5 K/uL   Basophils Relative 0 %   Basophils Absolute 0.0 0.0 - 0.1 K/uL   Immature Granulocytes 0 %   Abs Immature Granulocytes 0.04 0.00 - 0.07 K/uL    Comment: Performed at [facility], [place] [address], [place], [place] [address]  Magnesium     Status: Abnormal   Collection Time: 08/23/22  7:17 PM  Result Value Ref Range    Magnesium 1.5 (L) 1.7 - 2.4 mg/dL    Comment: Performed at [facility], [place] [address] [place], [place] [address]  Osmolality     Status: Abnormal   Collection Time: 08/23/22 11:59 PM  Result Value Ref Range   Osmolality 236 (LL) 275 - 295 mOsm/kg    Comment: REPEATED TO VERIFY CRITICAL RESULT CALLED TO, READ BACK BY AND VERIFIED WITH: ANNA JASPER '@0316'$  ON 08/24/22 SKL Performed at [facility], [place] [place], [place] [address]   Sodium     Status: Abnormal   Collection Time: 08/23/22 11:59 PM  Result Value Ref Range   Sodium 110 (LL) 135 - 145 mmol/L    Comment: CRITICAL RESULT CALLED TO, READ BACK BY AND VERIFIED WITH RIVERS,T RN @ 5701 ON [id number] BY MAHMOUD,S Performed at [facility], [place] [address] [place], [place] [address]   Sodium     Status: Abnormal   Collection Time: 08/24/22  1:46 AM  Result Value Ref Range   Sodium 109 (LL) 135 - 145 mmol/L    Comment: CRITICAL RESULT CALLED TO, READ BACK BY AND VERIFIED WITH RIVERS,T RN @ 3300 ON [id number] BY MAHMOUD,S Performed at [facility], [place] [address]  [place], [place] [address]   Basic metabolic panel     Status: Abnormal   Collection Time: 08/24/22  5:39 AM  Result Value Ref Range   Sodium 111 (LL) 135 - 145 mmol/L    Comment: CRITICAL RESULT CALLED TO, READ BACK BY AND VERIFIED WITH BLAIR,I RN @ [phone] ON [id number]  BY MAHMOUD,S    Potassium 3.3 (L) 3.5 - 5.1 mmol/L   Chloride 77 (L) 98 - 111 mmol/L   CO2 26 22 - 32 mmol/L   Glucose, Bld 132 (H) 70 - 99 mg/dL    Comment: Glucose reference range applies only to samples taken after fasting for at least 8 hours.   BUN 10 8 - 23 mg/dL   Creatinine, Ser 0.68 0.44 - 1.00 mg/dL   Calcium 8.7 (L) 8.9 - 10.3 mg/dL   GFR, Estimated >60 >60 mL/min    Comment: (NOTE) Calculated using the CKD-EPI Creatinine Equation (2021)    Anion gap 8 5 - 15    Comment: Performed at [facility], [place] [address] [place], [place] [address]  Magnesium     Status: None   Collection Time: 08/24/22  5:39 AM  Result Value Ref Range   Magnesium 1.9 1.7 - 2.4 mg/dL    Comment: Performed at [facility], [place] [address] [place], [place] [address]  Sodium     Status: Abnormal   Collection Time: 08/24/22  9:28 AM  Result Value Ref Range   Sodium 117 (LL) 135 - 145 mmol/L    Comment: DELTA CHECK NOTED CRITICAL RESULT CALLED TO, READ BACK BY AND VERIFIED WITH BLAIR,I. RN AT 1007 08/24/22 MULLINS,T Performed at [facility], [place] [address] [place], [place] [address]   CBC     Status: Abnormal   Collection Time: 08/24/22  9:28 AM  Result Value Ref Range   WBC 8.3 4.0 - 10.5 K/uL   RBC 4.98 3.87 - 5.11 MIL/uL   Hemoglobin 15.8 (H) 12.0 - 15.0 g/dL   HCT 42.2 36.0 - 46.0 %   MCV 84.7 80.0 - 100.0 fL   MCH 31.7 26.0 - 34.0 pg   MCHC 37.4 (H) 30.0 - 36.0 g/dL    Comment: CORRECTED FOR INTERFERING SUBSTANCE   RDW 10.9 (L) 11.5 - 15.5 %   Platelets 210 150 - 400 K/uL   nRBC 0.0 0.0 - 0.2 %    Comment: Performed at [facility], [place] [address] [place], [place] [address]  TSH     Status: None   Collection Time: 08/24/22  2:23 PM  Result Value Ref Range   TSH 0.659 0.350 - 4.500 uIU/mL    Comment: Performed by a 3rd Generation assay with a functional sensitivity of <=0.01 uIU/mL. Performed at [facility], [place] [address] [place], [place] [address]   Basic metabolic panel     Status: Abnormal   Collection Time: 08/24/22  2:23 PM  Result Value Ref Range   Sodium 122 (L) 135 - 145 mmol/L   Potassium 3.5 3.5 - 5.1 mmol/L   Chloride 81 (L) 98 - 111 mmol/L   CO2 25 22 - 32 mmol/L   Glucose, Bld 126 (H) 70 - 99 mg/dL    Comment: Glucose reference range applies only to samples taken after fasting for at least 8 hours.   BUN 11 8 - 23 mg/dL   Creatinine, Ser 0.79 0.44 - 1.00 mg/dL   Calcium 9.7 8.9 - 10.3 mg/dL  GFR,  Estimated >60 >60 mL/min    Comment: (NOTE) Calculated using the CKD-EPI Creatinine Equation (2021)    Anion gap 16 (H) 5 - 15    Comment: Performed at [facility], [place] [address] [place], [place] [address]   No results found.  Pending Labs Unresulted Labs (From admission, onward)     Start     Ordered   08/30/22 0500  Creatinine, serum  (enoxaparin (LOVENOX)    CrCl >/= 30 ml/min)  Weekly,   R     Comments: while on enoxaparin therapy    08/23/22 2216   08/25/22 0500  Renal function panel  Daily at 5am,   R      08/24/22 1240   08/25/22 0500  TSH  Tomorrow morning,   R        08/24/22 1457   08/25/22 0500  T4, free  Tomorrow morning,   R        08/24/22 1457   08/25/22 0500  T3, free  Tomorrow morning,   R        08/24/22 1457   08/25/22 0500  Cortisol  Tomorrow morning,   R        08/24/22 1457   08/24/22 1342  Sodium  Now then every 4 hours,   R (with TIMED occurrences)      08/24/22 1232   08/24/22 0500  CBC  Daily at 5am,   R      08/23/22 2216            Vitals/Pain Today's Vitals   08/24/22 1215 08/24/22 1415 08/24/22 1515 08/24/22 1600  BP: (!) 105/52  103/84 (!) 117/58  Pulse: 62  72 68  Resp: '13  17 17  '$ Temp:  99.2 F (37.3 C)    TempSrc:  Oral    SpO2: 98%  95% 95%  PainSc:        Isolation Precautions Airborne and Contact precautions  Medications Medications  doxycycline (VIBRA-TABS) tablet 100 mg (100 mg Oral Given 08/24/22 0927)  levothyroxine (SYNTHROID) tablet 100 mcg (100 mcg Oral Given 08/24/22 0928)  enoxaparin (LOVENOX) injection 40 mg (40 mg Subcutaneous Given 08/24/22 0339)  sodium chloride flush (NS) 0.9 % injection 3 mL (3 mLs Intravenous Given 08/24/22 0935)  acetaminophen (TYLENOL) tablet 650 mg (has no administration in time range)    Or  acetaminophen (TYLENOL) suppository 650 mg (has no administration in time range)  0.9 %  sodium chloride infusion (0 mLs Intravenous Stopped 08/24/22 1505)  labetalol  (NORMODYNE) injection 10 mg (has no administration in time range)  albuterol (VENTOLIN HFA) 108 (90 Base) MCG/ACT inhaler 2 puff (has no administration in time range)  guaiFENesin-dextromethorphan (ROBITUSSIN DM) 100-10 MG/5ML syrup 5 mL (has no administration in time range)  gabapentin (NEURONTIN) capsule 100 mg (100 mg Oral Given 08/24/22 0337)  ondansetron (ZOFRAN) injection 4 mg (has no administration in time range)  liothyronine (CYTOMEL) tablet 5 mcg (has no administration in time range)  0.9 %  sodium chloride infusion (0 mLs Intravenous Stopped 08/23/22 2244)  potassium chloride 10 mEq in 100 mL IVPB (0 mEq Intravenous Stopped 08/24/22 0436)  potassium chloride SA (KLOR-CON M) CR tablet 20 mEq (20 mEq Oral Given 08/24/22 0340)  magnesium sulfate IVPB 1 g 100 mL (0 g Intravenous Stopped 08/24/22 0132)  prochlorperazine (COMPAZINE) injection 10 mg (10 mg Intravenous Given 08/24/22 0020)  sodium chloride 3% (hypertonic) IV bolus 100 mL (0 mLs Intravenous Stopped 08/24/22 0831)  Mobility walks with device

## 2022-08-25 DIAGNOSIS — E871 Hypo-osmolality and hyponatremia: Secondary | ICD-10-CM | POA: Diagnosis not present

## 2022-08-25 LAB — CBC
HCT: 34.6 % — ABNORMAL LOW (ref 36.0–46.0)
Hemoglobin: 12.8 g/dL (ref 12.0–15.0)
MCH: 32.2 pg (ref 26.0–34.0)
MCHC: 37 g/dL — ABNORMAL HIGH (ref 30.0–36.0)
MCV: 86.9 fL (ref 80.0–100.0)
Platelets: 231 10*3/uL (ref 150–400)
RBC: 3.98 MIL/uL (ref 3.87–5.11)
RDW: 11.2 % — ABNORMAL LOW (ref 11.5–15.5)
WBC: 4.5 10*3/uL (ref 4.0–10.5)
nRBC: 0 % (ref 0.0–0.2)

## 2022-08-25 LAB — CORTISOL: Cortisol, Plasma: 12.2 ug/dL

## 2022-08-25 LAB — RENAL FUNCTION PANEL
Albumin: 3.4 g/dL — ABNORMAL LOW (ref 3.5–5.0)
Anion gap: 9 (ref 5–15)
BUN: 17 mg/dL (ref 8–23)
CO2: 25 mmol/L (ref 22–32)
Calcium: 9.3 mg/dL (ref 8.9–10.3)
Chloride: 92 mmol/L — ABNORMAL LOW (ref 98–111)
Creatinine, Ser: 0.66 mg/dL (ref 0.44–1.00)
GFR, Estimated: >60 mL/min (ref >60)
Glucose, Bld: 100 mg/dL — ABNORMAL HIGH (ref 70–99)
Phosphorus: 2.2 mg/dL — ABNORMAL LOW (ref 2.5–4.6)
Potassium: 3 mmol/L — ABNORMAL LOW (ref 3.5–5.1)
Sodium: 126 mmol/L — ABNORMAL LOW (ref 135–145)

## 2022-08-25 LAB — SODIUM
Sodium: 126 mmol/L — ABNORMAL LOW (ref 135–145)
Sodium: 126 mmol/L — ABNORMAL LOW (ref 135–145)
Sodium: 129 mmol/L — ABNORMAL LOW (ref 135–145)
Sodium: 128 mmol/L — ABNORMAL LOW (ref 135–145)
Sodium: 126 mmol/L — ABNORMAL LOW (ref 135–145)
Sodium: 127 mmol/L — ABNORMAL LOW (ref 135–145)

## 2022-08-25 LAB — T4, FREE: Free T4: 1.21 ng/dL — ABNORMAL HIGH (ref 0.61–1.12)

## 2022-08-25 LAB — TSH: TSH: 0.504 u[IU]/mL (ref 0.350–4.500)

## 2022-08-25 MED ORDER — LORATADINE 10 MG PO TABS
10.0000 mg | ORAL_TABLET | Freq: Every day | ORAL | Status: DC
  Administered 2022-08-25 – 2022-08-26 (×2): 10 mg via ORAL
  Filled 2022-08-25 (×2): qty 1

## 2022-08-25 MED ORDER — AMLODIPINE BESYLATE 5 MG PO TABS
5.0000 mg | ORAL_TABLET | Freq: Every day | ORAL | Status: DC
  Administered 2022-08-25 – 2022-08-26 (×2): 5 mg via ORAL
  Filled 2022-08-25 (×2): qty 1

## 2022-08-25 MED ORDER — DOCUSATE SODIUM 50 MG PO CAPS
50.0000 mg | ORAL_CAPSULE | Freq: Two times a day (BID) | ORAL | Status: DC | PRN
  Administered 2022-08-25 – 2022-08-26 (×2): 50 mg via ORAL
  Filled 2022-08-25 (×3): qty 1

## 2022-08-25 MED ORDER — POTASSIUM CHLORIDE CRYS ER 20 MEQ PO TBCR
20.0000 meq | EXTENDED_RELEASE_TABLET | Freq: Two times a day (BID) | ORAL | Status: AC
  Administered 2022-08-25 (×2): 20 meq via ORAL
  Filled 2022-08-25 (×2): qty 1

## 2022-08-25 MED ORDER — POTASSIUM PHOSPHATES 15 MMOLE/5ML IV SOLN
30.0000 mmol | Freq: Once | INTRAVENOUS | Status: AC
  Administered 2022-08-25: 30 mmol via INTRAVENOUS
  Filled 2022-08-25: qty 10

## 2022-08-25 MED ORDER — LIOTHYRONINE SODIUM 5 MCG PO TABS
5.0000 ug | ORAL_TABLET | ORAL | Status: DC
  Administered 2022-08-25 – 2022-08-26 (×3): 5 ug via ORAL
  Filled 2022-08-25 (×4): qty 1

## 2022-08-25 NOTE — Progress Notes (Addendum)
PROGRESS NOTE    Emily Maxwell  UDJ:[id number] DOB: [birth year] DOA: 08/23/2022  PCP: Ginger Organ., MD   Brief Narrative:  This 81 yrs old female with PMH  significant for asthma, urothelial carcinoma status post left nephroureterectomy in 2016, hypertension, hypothyroidism, on long-term doxycycline for rosacea, and recent COVID-19 infection who presents to the emergency department with nausea, vomiting, diarrhea, and low sodium on outpatient blood work.  Patient was admitted for further management.  Nephrology was consulted by the ED physician and the patient was started on normal saline infusion and given IV potassium replacement.  Patient has received hypertonic saline and serum sodium has improved.  Assessment & Plan:   Principal Problem:   Hyponatremia Active Problems:   Essential hypertension, benign   Asthma, chronic   Hypokalemia   History of COVID-19   Hypothyroidism  Acute hyponatremia: Hypovolemic, hypo-osmolar hyponatremia, likely worsened by HCTZ use. HCTZ discontinued, started on IV normal saline without much improvement. Nephrology is consulted. She has subsequently received  3% saline. Serum sodium has improved.  111>117>122>125>126 >129 TSH, free T4 and free T3 normal.  Cortisol within normal range. Serum sodium has improved to 126>129  Leukocytosis: No signs of any infection.  Suspect reactive. Leucocytosis resolved.  Hypokalemia/ Hypophosphatemia: Replaced . Continue to monitor.  Hypothyroidism: Continue levothyroxine 100 mcg daily  Asthma: Stable, not in any acute exacerbation.   Continue home inhalers.  Essential hypertension: BP reasonably controlled.  HCTZ discontinued.  COVID-19 infection: Developed symptoms 08/14/2022, tested + 08/17/2022, treated with paxloid and then switched to molnupiravir. While in hospital continue isolation for 10 days from positive test.  History of rosacea: Continue doxycycline 100 mg  daily.  Obesity: Estimated body mass index is 29.37 kg/m as calculated from the following:   Height as of this encounter: '5\' 3"'$  (1.6 m).   Weight as of this encounter: 75.2 kg.    DVT prophylaxis: Lovenox Code Status: Full code Family Communication: No family at bedside. Disposition Plan:   Status is: Inpatient Remains inpatient appropriate because: Admitted for hyponatremia requiring hypertonic saline.  Sodium is now improving.   Anticipated discharge home in 1 to 2 days.  Consultants:  None  Procedures: None Antimicrobials:  Anti-infectives (From admission, onward)    Start     Dose/Rate Route Frequency Ordered Stop   08/24/22 1000  doxycycline (VIBRA-TABS) tablet 100 mg        100 mg Oral Daily 08/23/22 2216         Subjective: Patient was seen and examined at bedside.  Overnight events noted. Patient reports doing much better and wants to go home.  Sodium is improving 126.  Objective: Vitals:   08/25/22 0700 08/25/22 0800 08/25/22 0900 08/25/22 1000  BP:   (!) 133/58 132/70  Pulse: (!) 58 62 63 65  Resp: '13 20 14 '$ (!) 28  Temp:      TempSrc:      SpO2: 96% 97% 98% 95%  Weight:      Height:        Intake/Output Summary (Last 24 hours) at 08/25/2022 1058 Last data filed at 08/25/2022 1000 Gross per 24 hour  Intake 36.39 ml  Output --  Net 36.39 ml   Filed Weights   08/24/22 1809 08/25/22 0500  Weight: 75.8 kg 75.2 kg    Examination:  General exam: Appears comfortable, not in any acute distress. Respiratory system: CTA bilaterally, respiratory effort normal, RR 15 Cardiovascular system: S1 & S2 heard, regular rate and rhythm, no  murmur. Gastrointestinal system: Abdomen is soft, nontender, nondistended, BS+ Central nervous system: Alert and oriented x 3. No focal neurological deficits. Extremities: No edema, no cyanosis, no clubbing Skin: No rashes, lesions or ulcers Psychiatry: Judgement and insight appear normal. Mood & affect appropriate.      Data Reviewed: I have personally reviewed following labs and imaging studies  CBC: Recent Labs  Lab 08/23/22 1917 08/24/22 0928 08/25/22 0316  WBC 13.9* 8.3 4.5  NEUTROABS 11.9*  --   --   HGB 15.4* 15.8* 12.8  HCT 40.2 42.2 34.6*  MCV 83.4 84.7 86.9  PLT 292 210 476   Basic Metabolic Panel: Recent Labs  Lab 08/23/22 1917 08/23/22 2359 08/24/22 0539 08/24/22 0928 08/24/22 1423 08/24/22 2127 08/25/22 0316 08/25/22 0531 08/25/22 0926  NA 111*   < > 111*   < > 122* 125* 126*  126* 126* 129*  K 2.9*  --  3.3*  --  3.5  --  3.0*  --   --   CL 72*  --  77*  --  81*  --  92*  --   --   CO2 25  --  26  --  25  --  25  --   --   GLUCOSE 164*  --  132*  --  126*  --  100*  --   --   BUN 11  --  10  --  11  --  17  --   --   CREATININE 0.62  --  0.68  --  0.79  --  0.66  --   --   CALCIUM 9.7  --  8.7*  --  9.7  --  9.3  --   --   MG 1.5*  --  1.9  --   --   --   --   --   --   PHOS  --   --   --   --   --   --  2.2*  --   --    < > = values in this interval not displayed.   GFR: Estimated Creatinine Clearance: 53.5 mL/min (by C-G formula based on SCr of 0.66 mg/dL). Liver Function Tests: Recent Labs  Lab 08/23/22 1917 08/25/22 0316  AST 30  --   ALT 23  --   ALKPHOS 82  --   BILITOT 1.6*  --   PROT 7.5  --   ALBUMIN 4.3 3.4*   No results for input(s): "LIPASE", "AMYLASE" in the last 168 hours. No results for input(s): "AMMONIA" in the last 168 hours. Coagulation Profile: No results for input(s): "INR", "PROTIME" in the last 168 hours. Cardiac Enzymes: No results for input(s): "CKTOTAL", "CKMB", "CKMBINDEX", "TROPONINI" in the last 168 hours. BNP (last 3 results) No results for input(s): "PROBNP" in the last 8760 hours. HbA1C: No results for input(s): "HGBA1C" in the last 72 hours. CBG: No results for input(s): "GLUCAP" in the last 168 hours. Lipid Profile: No results for input(s): "CHOL", "HDL", "LDLCALC", "TRIG", "CHOLHDL", "LDLDIRECT" in the last 72  hours. Thyroid Function Tests: Recent Labs    08/25/22 0316  TSH 0.504  FREET4 1.21*   Anemia Panel: No results for input(s): "VITAMINB12", "FOLATE", "FERRITIN", "TIBC", "IRON", "RETICCTPCT" in the last 72 hours. Sepsis Labs: No results for input(s): "PROCALCITON", "LATICACIDVEN" in the last 168 hours.  Recent Results (from the past 240 hour(s))  MRSA Next Gen by PCR, Nasal     Status: None  Collection Time: 08/24/22  6:01 PM   Specimen: Nasal Mucosa; Nasal Swab  Result Value Ref Range Status   MRSA by PCR Next Gen NOT DETECTED NOT DETECTED Final    Comment: (NOTE) The GeneXpert MRSA Assay (FDA approved for NASAL specimens only), is one component of a comprehensive MRSA colonization surveillance program. It is not intended to diagnose MRSA infection nor to guide or monitor treatment for MRSA infections. Test performance is not FDA approved in patients less than 6 years old. Performed at [facility], [place] [address], [place], [place] [address]     Radiology Studies: No results found.  Scheduled Meds:  Chlorhexidine Gluconate Cloth  6 each Topical Daily   doxycycline  100 mg Oral Daily   enoxaparin (LOVENOX) injection  40 mg Subcutaneous Q24H   levothyroxine  100 mcg Oral Daily   liothyronine  5 mcg Oral 2 times per day   sodium chloride flush  3 mL Intravenous Q12H   Continuous Infusions:  potassium PHOSPHATE IVPB (in mmol) 85 mL/hr at 08/25/22 1000     LOS: 2 days    Time spent: 50 mins    Lamyiah Crawshaw, MD [facility]   If 7PM-7AM, please contact night-coverage

## 2022-08-25 NOTE — Evaluation (Addendum)
Occupational Therapy Evaluation Patient Details Name: Emily Maxwell MRN: [id number] DOB: Jul 28, 1941 Today's Date: 08/25/2022   History of Present Illness his 81 yrs old female with PMH  significant for asthma, urothelial carcinoma status post left nephroureterectomy in 2016, hypertension, hypothyroidism, on long-term doxycycline for rosacea, and recent COVID-19 infection who presents to the emergency department with nausea, vomiting, diarrhea, and low sodium.   Clinical Impression   Emily Maxwell is an 81 year old woman admitted with hyponatremia. On evaluation she reports feeling better. She reports a recent history of a L TSA  and that her arm is functional now. On evaluation she demonstrates ability to ambulate in room without loss of balance, independence with bed transfers and toileting. She reports no new deficits, and is at her baseline. Patient reports no therapy needs.      Recommendations for follow up therapy are one component of a multi-disciplinary discharge planning process, led by the attending physician.  Recommendations may be updated based on patient status, additional functional criteria and insurance authorization.   Follow Up Recommendations  No OT follow up    Assistance Recommended at Discharge None  Patient can return home with the following      Functional Status Assessment  Patient has not had a recent decline in their functional status  Equipment Recommendations  None recommended by OT    Recommendations for Other Services       Precautions / Restrictions Precautions Precautions: None Restrictions Weight Bearing Restrictions: No      Mobility Bed Mobility Overal bed mobility: Independent                  Transfers Overall transfer level: Needs assistance                 General transfer comment: Supervision for assessment purposes only and assist to manage IV lines/leads      Balance Overall balance assessment: No apparent  balance deficits (not formally assessed)                                         ADL either performed or assessed with clinical judgement   ADL Overall ADL's : Independent                                             Vision Patient Visual Report: No change from baseline       Perception     Praxis      Pertinent Vitals/Pain Pain Assessment Pain Assessment: No/denies pain     Hand Dominance Right   Extremity/Trunk Assessment Upper Extremity Assessment Upper Extremity Assessment: Overall WFL for tasks assessed (hx of L TSA. Reports decreased internal rotation but otherwise functional ROM)   Lower Extremity Assessment Lower Extremity Assessment: Overall WFL for tasks assessed   Cervical / Trunk Assessment Cervical / Trunk Assessment: Normal   Communication Communication Communication: No difficulties   Cognition Arousal/Alertness: Awake/alert Behavior During Therapy: WFL for tasks assessed/performed Overall Cognitive Status: Within Functional Limits for tasks assessed                                       General Comments  Exercises     Shoulder Instructions      Home Living Family/patient expects to be discharged to:: Private residence Living Arrangements: Spouse/significant other Available Help at Discharge: Family Type of Home: House Home Access: Stairs to enter [employer] of Steps: 3 - garage   Home Layout: One level     Bathroom Shower/Tub: Tub/shower unit;Walk-in shower   Bathroom Toilet: Standard                Prior Functioning/Environment Prior Level of Function : Independent/Modified Independent                        OT Problem List:        OT Treatment/Interventions:      OT Goals(Current goals can be found in the care plan section) Acute Rehab OT Goals OT Goal Formulation: All assessment and education complete, DC therapy  OT Frequency:       Co-evaluation              AM-PAC OT "6 Clicks" Daily Activity     Outcome Measure Help from another person eating meals?: None Help from another person taking care of personal grooming?: None Help from another person toileting, which includes using toliet, bedpan, or urinal?: None Help from another person bathing (including washing, rinsing, drying)?: None Help from another person to put on and taking off regular upper body clothing?: None Help from another person to put on and taking off regular lower body clothing?: None 6 Click Score: 24   End of Session Nurse Communication: Mobility status  Activity Tolerance: Patient tolerated treatment well Patient left: in chair;with call bell/phone within reach;with family/visitor present  OT Visit Diagnosis: Unsteadiness on feet (R26.81)                Time: 9480-1655 OT Time Calculation (min): 19 min Charges:  OT General Charges $OT Visit: 1 Visit OT Evaluation $OT Eval Low Complexity: 1 Low  Gustavo Lah, OTR/L [place]  Office [phone]   Lenward Chancellor 08/25/2022, 11:57 AM

## 2022-08-25 NOTE — Progress Notes (Addendum)
Patient ID: Emily Maxwell, female   DOB: [birth year], 81 y.o.   MRN: [id number] [facility] Progress Note   Assessment/ Plan:   1.  Acute on chronic hyponatremia: Euvolemic to possibly hypovolemic with true hypoosmolar hyponatremia.  Underlying/baseline hyponatremia likely from reset osmostat versus thiazide use and appears to have been exacerbated in the setting of her acute respiratory illness and nausea/vomiting with oral hypotonic fluid replacement at home.  Sodium level is improving at an acceptable rate (18 mEq over the past 48 hours) with a combination of normal saline and 100 cc 3% saline bolus given to limit neurological problems.  Would recommend discontinuing HCTZ indefinitely. 2.  Hypokalemia: Likely secondary to preceding thiazide use and limited intake/GI losses.  Ongoing replacement via IV route with potassium phosphate and I will add oral potassium supplement. 3.  Hypertension: Vital signs show blood pressure rising off of thiazide.  Will start low-dose amlodipine; she does not want to start an ARB as she feels that this might have been culpable for urothelial carcinoma. 4.  Polycythemia: Likely from hemoconcentration in the setting of volume contraction, improved with IV fluids.  Subjective:   Reports to be feeling fair and with some questions regarding blood pressure control/hyponatremia.   Objective:   BP 132/70   Pulse 65   Temp 98.2 F (36.8 C) (Oral)   Resp (!) 28   Ht '5\' 3"'$  (1.6 m)   Wt 75.2 kg   LMP 07/11/1983   SpO2 95%   BMI 29.37 kg/m   Intake/Output Summary (Last 24 hours) at 08/25/2022 1209 Last data filed at 08/25/2022 1000 Gross per 24 hour  Intake 36.39 ml  Output --  Net 36.39 ml   Weight change:   Physical Exam: Gen: Comfortably resting in bed, awake/alert and engages in appropriate conversation CVS: Pulse regular rhythm, normal rate, S1 and S2 normal Resp: Clear to auscultation bilaterally, no wheezing rales or rhonchi Abd: Soft,  flat, nontender, bowel sounds normal  Ext: No lower extremity edema palpable  Imaging: No results found.  Labs: BMET Recent Labs  Lab 08/23/22 1917 08/23/22 2359 08/24/22 0539 08/24/22 8127 08/24/22 1423 08/24/22 2127 08/25/22 0316 08/25/22 0531 08/25/22 0926  NA 111*   < > 111* 117* 122* 125* 126*  126* 126* 129*  K 2.9*  --  3.3*  --  3.5  --  3.0*  --   --   CL 72*  --  77*  --  81*  --  92*  --   --   CO2 25  --  26  --  25  --  25  --   --   GLUCOSE 164*  --  132*  --  126*  --  100*  --   --   BUN 11  --  10  --  11  --  17  --   --   CREATININE 0.62  --  0.68  --  0.79  --  0.66  --   --   CALCIUM 9.7  --  8.7*  --  9.7  --  9.3  --   --   PHOS  --   --   --   --   --   --  2.2*  --   --    < > = values in this interval not displayed.   CBC Recent Labs  Lab 08/23/22 1917 08/24/22 0928 08/25/22 0316  WBC 13.9* 8.3 4.5  NEUTROABS 11.9*  --   --  HGB 15.4* 15.8* 12.8  HCT 40.2 42.2 34.6*  MCV 83.4 84.7 86.9  PLT 292 210 231    Medications:     Chlorhexidine Gluconate Cloth  6 each Topical Daily   doxycycline  100 mg Oral Daily   enoxaparin (LOVENOX) injection  40 mg Subcutaneous Q24H   levothyroxine  100 mcg Oral Daily   liothyronine  5 mcg Oral 2 times per day   sodium chloride flush  3 mL Intravenous Q12H    Elmarie Shiley, MD 08/25/2022, 12:09 PM

## 2022-08-25 NOTE — Progress Notes (Signed)
PT Cancellation Note  Patient Details Name: Emily Maxwell MRN: [id number] DOB: 1940-12-17   Cancelled Treatment:    Reason Eval/Treat Not Completed: PT screened, no needs identified, will sign off OT reports pt is at her baseline and does not have PT needs.  PT to sign off.   Myrtis Hopping [place] 08/25/2022, 2:48 PM Jannette Spanner PT, DPT Physical Therapist Acute Rehabilitation Services Preferred contact method: Secure Chat Weekend Pager Only: [phone] Office: [phone]

## 2022-08-25 NOTE — TOC Progression Note (Signed)
Transition of Care [facility]) - Progression Note    Patient Details  Name: Bridgitte Felicetti MRN: [id number] Date of Birth: 19-Jan-1941  Transition of [facility]) CM/SW Contact  Henrietta Dine, RN Phone Number: 08/25/2022, 8:59 AM  Clinical Narrative:      Transition of Care [facility]) Screening Note   Patient Details  Name: Aditri Louischarles Date of Birth: Jun 12, 1941   Transition of Care [facility]) CM/SW Contact:    Henrietta Dine, RN Phone Number: 08/25/2022, 8:59 AM    Transition of Care Department [facility]) has reviewed patient and no TOC needs have been identified at this time. We will continue to monitor patient advancement through interdisciplinary progression rounds. If new patient transition needs arise, please place a TOC consult.         Expected Discharge Plan and Services                                                 Social Determinants of Health (SDOH) Interventions    Readmission Risk Interventions     No data to display

## 2022-08-26 ENCOUNTER — Other Ambulatory Visit (HOSPITAL_COMMUNITY): Payer: Self-pay

## 2022-08-26 DIAGNOSIS — E871 Hypo-osmolality and hyponatremia: Secondary | ICD-10-CM | POA: Diagnosis not present

## 2022-08-26 LAB — RENAL FUNCTION PANEL
Albumin: 3.2 g/dL — ABNORMAL LOW (ref 3.5–5.0)
Anion gap: 7 (ref 5–15)
BUN: 21 mg/dL (ref 8–23)
CO2: 24 mmol/L (ref 22–32)
Calcium: 9 mg/dL (ref 8.9–10.3)
Chloride: 97 mmol/L — ABNORMAL LOW (ref 98–111)
Creatinine, Ser: 0.82 mg/dL (ref 0.44–1.00)
GFR, Estimated: >60 mL/min (ref >60)
Glucose, Bld: 100 mg/dL — ABNORMAL HIGH (ref 70–99)
Phosphorus: 3.5 mg/dL (ref 2.5–4.6)
Potassium: 4 mmol/L (ref 3.5–5.1)
Sodium: 128 mmol/L — ABNORMAL LOW (ref 135–145)

## 2022-08-26 LAB — CBC
HCT: 35.4 % — ABNORMAL LOW (ref 36.0–46.0)
Hemoglobin: 12.6 g/dL (ref 12.0–15.0)
MCH: 31.7 pg (ref 26.0–34.0)
MCHC: 35.6 g/dL (ref 30.0–36.0)
MCV: 89.2 fL (ref 80.0–100.0)
Platelets: 271 10*3/uL (ref 150–400)
RBC: 3.97 MIL/uL (ref 3.87–5.11)
RDW: 11.7 % (ref 11.5–15.5)
WBC: 5.4 10*3/uL (ref 4.0–10.5)
nRBC: 0 % (ref 0.0–0.2)

## 2022-08-26 LAB — T3, FREE: T3, Free: 2.4 pg/mL (ref 2.0–4.4)

## 2022-08-26 MED ORDER — UREA 15 G PO PACK
15.0000 g | PACK | Freq: Every day | ORAL | Status: DC
  Administered 2022-08-26: 15 g via ORAL
  Filled 2022-08-26: qty 1

## 2022-08-26 MED ORDER — ALBUTEROL SULFATE HFA 108 (90 BASE) MCG/ACT IN AERS: 2.0000 | INHALATION_SPRAY | RESPIRATORY_TRACT | 1 refills | Status: DC | PRN

## 2022-08-26 MED ORDER — AMLODIPINE BESYLATE 5 MG PO TABS: 5.0000 mg | ORAL_TABLET | Freq: Every day | ORAL | 1 refills | Status: AC

## 2022-08-26 MED ORDER — CHLORHEXIDINE GLUCONATE CLOTH 2 % EX PADS: 6.0000 | MEDICATED_PAD | Freq: Every day | CUTANEOUS | Status: DC

## 2022-08-26 MED ORDER — UREA 15 G PO PACK: 15.0000 g | PACK | Freq: Every day | ORAL | 3 refills | Status: DC

## 2022-08-26 NOTE — Discharge Summary (Signed)
Physician Discharge Summary  Emily Maxwell ION:[id number] DOB: January 23, 1941 DOA: 08/23/2022  PCP: Ginger Organ., MD  Admit date: 08/23/2022  Discharge date: 08/26/2022  Admitted From: Home.  Disposition:  Home.  Recommendations for Outpatient Follow-up:  Follow up with PCP in 1-2 weeks. Please obtain BMP/CBC in one week. Advised to discontinue hydrochlorothiazide indefinitely. Advised to start amlodipine 5 mg daily for hypertension. Advised to take urea 15 g daily for hyponatremia. Advised to follow-up with Nephrology as scheduled.  Home Health: None Equipment/Devices: None  Discharge Condition: Stable CODE STATUS: Full code Diet recommendation: Regular diet.  Brief Summary/ Hospital Course: This 81 yrs old female with PMH  significant for asthma, urothelial carcinoma status post left nephroureterectomy in 2016, hypertension, hypothyroidism, on long-term doxycycline for rosacea, and recent COVID-19 infection who presents to the emergency department with nausea, vomiting, diarrhea, and low sodium on outpatient blood work.  Patient was admitted for further management. Nephrology was consulted by the ED physician and the patient was started on normal saline infusion and given IV potassium replacement. Sodium did not improve with normal saline infusion. Patient has received hypertonic saline and serum sodium has improved slowly to 128.  Patient feels better and wants to be discharged. Discussed with nephrology, Patient is started on urea 15 g daily until serum sodium remains above 130.  Hydrochlorothiazide discontinued indefinitely. Nephrology agreeable with discharge plan. Patient is being discharged home.  Discharge Diagnoses:  Principal Problem:   Hyponatremia Active Problems:   Essential hypertension, benign   Asthma, chronic   Hypokalemia   History of COVID-19   Hypothyroidism  Acute hyponatremia: Hypovolemic, hypo-osmolar hyponatremia, likely caused by HCTZ use. HCTZ  discontinued, started on IV normal saline without much improvement. Nephrology is consulted. She has subsequently received  3% saline. Serum sodium has improved.  111>117>122>125>126 >129>128 TSH, free T4 and free T3 normal.  Cortisol within normal range. Serum sodium has improved to [phone] Patient is started on urea 15 g daily until serum sodium to improve to above 130.  Leukocytosis: No signs of any infection.  Suspect reactive. Leucocytosis resolved.   Hypokalemia/ Hypophosphatemia: Replaced . Resolved   Hypothyroidism: Continue levothyroxine 100 mcg daily.   Asthma: Stable, not in any acute exacerbation.   Continue home inhalers.   Essential hypertension: BP reasonably controlled.  HCTZ discontinued.   COVID-19 infection: Developed symptoms on 08/14/2022, tested + 08/17/2022, treated with paxloid and then switched to molnupiravir. While in hospital continue isolation for 10 days from positive test.   History of rosacea: Continue doxycycline 100 mg daily.   Obesity: Estimated body mass index is 29.37 kg/m as calculated from the following:   Height as of this encounter: '5\' 3"'$  (1.6 m).   Weight as of this encounter: 75.2 kg.     Discharge Instructions  Discharge Instructions     Call MD for:  difficulty breathing, headache or visual disturbances   Complete by: As directed    Call MD for:  persistant dizziness or light-headedness   Complete by: As directed    Call MD for:  persistant nausea and vomiting   Complete by: As directed    Diet - low sodium heart healthy   Complete by: As directed    Diet Carb Modified   Complete by: As directed    Discharge instructions   Complete by: As directed    Advised to follow-up with primary care physician in 1 week. Advised to discontinue hydrochlorothiazide indefinitely. Advised to start amlodipine 5 mg daily for hypertension.  Advised to take urea 15 g daily for hyponatremia. Advised to follow-up with nephrology as  scheduled.   Increase activity slowly   Complete by: As directed       Allergies as of 08/26/2022       Reactions   Ketorolac Tromethamine Palpitations   Avelox [moxifloxacin Hcl In Nacl] Other (See Comments)   Unable to describe reaction   Epinephrine Other (See Comments)   Makes heart rate increase and cold sweats   Other Other (See Comments)   Sulfamethoxazole-trimethoprim Other (See Comments)   Tramadol Other (See Comments)   Unable to describe reaction   Penicillins Rash   Childhood rxn from weekly PCN shots w/ asthma Tx Has patient had a PCN reaction causing immediate rash, facial/tongue/throat swelling, SOB or lightheadedness with hypotension: unknown Has patient had a PCN reaction causing severe rash involving mucus membranes or skin necrosis: unknown Has patient had a PCN reaction that required hospitalization: no   Has patient had a PCN reaction occurring within the last 10 years: no If all of the above answers are "NO", then may proceed with Cephalosporin   Sulfa Antibiotics Rash   Tolerates non-antibiotic sulfa drugs        Medication List     STOP taking these medications    EPINEPHrine 0.3 mg/0.3 mL Soaj injection Commonly known as: EPI-PEN   hydrochlorothiazide 12.5 MG tablet Commonly known as: HYDRODIURIL   HYDROcodone-acetaminophen 10-325 MG tablet Commonly known as: NORCO   Lagevrio 200 MG Caps capsule Generic drug: molnupiravir EUA   levalbuterol 45 MCG/ACT inhaler Commonly known as: XOPENEX HFA   montelukast 10 MG tablet Commonly known as: SINGULAIR       TAKE these medications    acetaminophen 500 MG tablet Commonly known as: TYLENOL Take 500-1,000 mg by mouth every 6 (six) hours as needed (pain.).   albuterol 108 (90 Base) MCG/ACT inhaler Commonly known as: VENTOLIN HFA Inhale 2 puffs into the lungs every 4 (four) hours as needed for wheezing or shortness of breath.   amLODipine 5 MG tablet Commonly known as: NORVASC Take 1  tablet (5 mg total) by mouth daily. Start taking on: August 27, 2022   benzonatate 100 MG capsule Commonly known as: TESSALON Take 200 mg by mouth every 8 (eight) hours as needed for cough.   doxycycline 100 MG tablet Commonly known as: VIBRA-TABS Take 100 mg by mouth daily.   gabapentin 100 MG capsule Commonly known as: NEURONTIN Take 100 mg by mouth daily as needed (restless leg syndrome).   levothyroxine 100 MCG tablet Commonly known as: SYNTHROID Take 1 tablet by mouth daily.   liothyronine 5 MCG tablet Commonly known as: CYTOMEL Take 5 mcg by mouth in the morning and at bedtime. (0700 & 1200)   urea 15 g Pack oral packet Commonly known as: URE-NA Take 15 g by mouth daily.   Zetia 10 MG tablet Generic drug: ezetimibe Take 10 mg by mouth in the morning.        Follow-up Information     Ginger Organ., MD Follow up in 1 week(s).   Specialty: Internal Medicine Contact information: [place] [address] [place], [facility] Follow up in 1 week(s).   Contact information: [place] [address] [phone]                Allergies  Allergen Reactions   Ketorolac Tromethamine Palpitations   Avelox [  Moxifloxacin Hcl In Nacl] Other (See Comments)    Unable to describe reaction   Epinephrine Other (See Comments)    Makes heart rate increase and cold sweats   Other Other (See Comments)   Sulfamethoxazole-Trimethoprim Other (See Comments)   Tramadol Other (See Comments)    Unable to describe reaction   Penicillins Rash    Childhood rxn from weekly PCN shots w/ asthma Tx Has patient had a PCN reaction causing immediate rash, facial/tongue/throat swelling, SOB or lightheadedness with hypotension: unknown Has patient had a PCN reaction causing severe rash involving mucus membranes or skin necrosis: unknown Has patient had a PCN reaction that required hospitalization: no   Has patient had  a PCN reaction occurring within the last 10 years: no If all of the above answers are "NO", then may proceed with Cephalosporin   Sulfa Antibiotics Rash    Tolerates non-antibiotic sulfa drugs    Consultations: Nephrology   Procedures/Studies: No results found.    Subjective: Patient was seen and examined at bed side, She reports doing much better and wants to be discharged.   Discharge Exam: Vitals:   08/26/22 1000 08/26/22 1055  BP: 138/63 (!) 145/62  Pulse: 71 74  Resp: 16 17  Temp:    SpO2: 97% 96%   Vitals:   08/26/22 0800 08/26/22 0900 08/26/22 1000 08/26/22 1055  BP: (!) 144/54  138/63 (!) 145/62  Pulse: 68 77 71 74  Resp: '19 18 16 17  '$ Temp:      TempSrc:      SpO2: 97% 97% 97% 96%  Weight:      Height:        General: Pt is alert, awake, not in acute distress Cardiovascular: RRR, S1/S2 +, no rubs, no gallops Respiratory: CTA bilaterally, no wheezing, no rhonchi Abdominal: Soft, NT, ND, bowel sounds + Extremities: no edema, no cyanosis    The results of significant diagnostics from this hospitalization (including imaging, microbiology, ancillary and laboratory) are listed below for reference.     Microbiology: Recent Results (from the past 240 hour(s))  MRSA Next Gen by PCR, Nasal     Status: None   Collection Time: 08/24/22  6:01 PM   Specimen: Nasal Mucosa; Nasal Swab  Result Value Ref Range Status   MRSA by PCR Next Gen NOT DETECTED NOT DETECTED Final    Comment: (NOTE) The GeneXpert MRSA Assay (FDA approved for NASAL specimens only), is one component of a comprehensive MRSA colonization surveillance program. It is not intended to diagnose MRSA infection nor to guide or monitor treatment for MRSA infections. Test performance is not FDA approved in patients less than 46 years old. Performed at [facility], [place] [address], [place], [place] [address]      Labs: BNP (last 3 results) No results for input(s): "BNP" in the  last 8760 hours. Basic Metabolic Panel: Recent Labs  Lab 08/23/22 1917 08/23/22 2359 08/24/22 0539 08/24/22 0928 08/24/22 1423 08/24/22 2127 08/25/22 0316 08/25/22 0531 08/25/22 0926 08/25/22 1350 08/25/22 1729 08/25/22 2130 08/26/22 0320  NA 111*   < > 111*   < > 122*   < > 126*  126*   < > 129* 128* 126* 127* 128*  K 2.9*  --  3.3*  --  3.5  --  3.0*  --   --   --   --   --  4.0  CL 72*  --  77*  --  81*  --  92*  --   --   --   --   --  97*  CO2 25  --  26  --  25  --  25  --   --   --   --   --  24  GLUCOSE 164*  --  132*  --  126*  --  100*  --   --   --   --   --  100*  BUN 11  --  10  --  11  --  17  --   --   --   --   --  21  CREATININE 0.62  --  0.68  --  0.79  --  0.66  --   --   --   --   --  0.82  CALCIUM 9.7  --  8.7*  --  9.7  --  9.3  --   --   --   --   --  9.0  MG 1.5*  --  1.9  --   --   --   --   --   --   --   --   --   --   PHOS  --   --   --   --   --   --  2.2*  --   --   --   --   --  3.5   < > = values in this interval not displayed.   Liver Function Tests: Recent Labs  Lab 08/23/22 1917 08/25/22 0316 08/26/22 0320  AST 30  --   --   ALT 23  --   --   ALKPHOS 82  --   --   BILITOT 1.6*  --   --   PROT 7.5  --   --   ALBUMIN 4.3 3.4* 3.2*   No results for input(s): "LIPASE", "AMYLASE" in the last 168 hours. No results for input(s): "AMMONIA" in the last 168 hours. CBC: Recent Labs  Lab 08/23/22 1917 08/24/22 0928 08/25/22 0316 08/26/22 0320  WBC 13.9* 8.3 4.5 5.4  NEUTROABS 11.9*  --   --   --   HGB 15.4* 15.8* 12.8 12.6  HCT 40.2 42.2 34.6* 35.4*  MCV 83.4 84.7 86.9 89.2  PLT 292 210 231 271   Cardiac Enzymes: No results for input(s): "CKTOTAL", "CKMB", "CKMBINDEX", "TROPONINI" in the last 168 hours. BNP: Invalid input(s): "POCBNP" CBG: No results for input(s): "GLUCAP" in the last 168 hours. D-Dimer No results for input(s): "DDIMER" in the last 72 hours. Hgb A1c No results for input(s): "HGBA1C" in the last 72  hours. Lipid Profile No results for input(s): "CHOL", "HDL", "LDLCALC", "TRIG", "CHOLHDL", "LDLDIRECT" in the last 72 hours. Thyroid function studies Recent Labs    08/25/22 0316  TSH 0.504  T3FREE 2.4   Anemia work up No results for input(s): "VITAMINB12", "FOLATE", "FERRITIN", "TIBC", "IRON", "RETICCTPCT" in the last 72 hours. Urinalysis    Component Value Date/Time   COLORURINE YELLOW 08/23/2022 0003   APPEARANCEUR CLEAR 08/23/2022 0003   LABSPEC 1.015 08/23/2022 0003   PHURINE 7.0 08/23/2022 0003   GLUCOSEU NEGATIVE 08/23/2022 0003   HGBUR NEGATIVE 08/23/2022 0003   BILIRUBINUR NEGATIVE 08/23/2022 0003   KETONESUR 20 (A) 08/23/2022 0003   PROTEINUR 30 (A) 08/23/2022 0003   NITRITE NEGATIVE 08/23/2022 0003   LEUKOCYTESUR NEGATIVE 08/23/2022 0003   Sepsis Labs Recent Labs  Lab 08/23/22 1917 08/24/22 0928 08/25/22 0316 08/26/22 0320  WBC 13.9* 8.3 4.5 5.4   Microbiology Recent Results (from the past 240 hour(s))  MRSA Next Gen by PCR, Nasal     Status: None   Collection Time: 08/24/22  6:01 PM   Specimen: Nasal Mucosa; Nasal Swab  Result Value Ref Range Status   MRSA by PCR Next Gen NOT DETECTED NOT DETECTED Final    Comment: (NOTE) The GeneXpert MRSA Assay (FDA approved for NASAL specimens only), is one component of a comprehensive MRSA colonization surveillance program. It is not intended to diagnose MRSA infection nor to guide or monitor treatment for MRSA infections. Test performance is not FDA approved in patients less than 40 years old. Performed at [facility], [place] [address], [place], [place] [address]      Time coordinating discharge: Over 30 minutes  SIGNED:   Shawna Clamp, MD  [facility] 08/26/2022, 3:00 PM Pager   If 7PM-7AM, please contact night-coverage

## 2022-08-26 NOTE — Progress Notes (Signed)
Brief Nephrology Note  Patient discharged before I was able to see her.  Sodium stable at 128 and improved symptoms.  I did start her on urea 15 g daily at the time of discharge with goal sodium outpatient of at least 130.  No further thiazide.  Once sodium is consistently above 130 could try off of urea.  If hyponatremia persists in the outpatient setting could refer to nephrology.

## 2022-08-26 NOTE — Discharge Instructions (Signed)
Advised to follow-up with primary care physician in 1 week. Advised to discontinue hydrochlorothiazide indefinitely. Advised to start amlodipine 5 mg daily for hypertension. Advised to take urea 15 g daily for hyponatremia. Advised to follow-up with nephrology as scheduled.

## 2022-08-26 NOTE — Progress Notes (Signed)
Patient discharging home with husband. Vitals stable at time of discharge. PIV's removed. All belongings returned.

## 2022-09-01 DIAGNOSIS — E871 Hypo-osmolality and hyponatremia: Secondary | ICD-10-CM | POA: Diagnosis not present

## 2022-09-01 DIAGNOSIS — I10 Essential (primary) hypertension: Secondary | ICD-10-CM | POA: Diagnosis not present

## 2022-09-01 DIAGNOSIS — K529 Noninfective gastroenteritis and colitis, unspecified: Secondary | ICD-10-CM | POA: Diagnosis not present

## 2022-09-07 DIAGNOSIS — I10 Essential (primary) hypertension: Secondary | ICD-10-CM | POA: Diagnosis not present

## 2022-09-07 DIAGNOSIS — E785 Hyperlipidemia, unspecified: Secondary | ICD-10-CM | POA: Diagnosis not present

## 2022-09-13 ENCOUNTER — Ambulatory Visit (HOSPITAL_COMMUNITY)
Admission: RE | Admit: 2022-09-13 | Discharge: 2022-09-13 | Disposition: A | Discharge status: Home / Self Care | Payer: Medicare Other | Source: Ambulatory Visit | Attending: Urology | Admitting: Urology

## 2022-09-13 ENCOUNTER — Other Ambulatory Visit (HOSPITAL_COMMUNITY): Payer: Self-pay | Admitting: Urology

## 2022-09-13 ENCOUNTER — Ambulatory Visit: Payer: Medicare Other | Admitting: Gastroenterology

## 2022-09-13 DIAGNOSIS — C652 Malignant neoplasm of left renal pelvis: Secondary | ICD-10-CM

## 2022-09-13 DIAGNOSIS — C763 Malignant neoplasm of pelvis: Secondary | ICD-10-CM | POA: Diagnosis not present

## 2022-09-14 DIAGNOSIS — K5730 Diverticulosis of large intestine without perforation or abscess without bleeding: Secondary | ICD-10-CM | POA: Diagnosis not present

## 2022-09-14 DIAGNOSIS — K429 Umbilical hernia without obstruction or gangrene: Secondary | ICD-10-CM | POA: Diagnosis not present

## 2022-09-14 DIAGNOSIS — C652 Malignant neoplasm of left renal pelvis: Secondary | ICD-10-CM | POA: Diagnosis not present

## 2022-09-20 DIAGNOSIS — C652 Malignant neoplasm of left renal pelvis: Secondary | ICD-10-CM | POA: Diagnosis not present

## 2022-09-20 DIAGNOSIS — Z905 Acquired absence of kidney: Secondary | ICD-10-CM | POA: Diagnosis not present

## 2022-09-20 DIAGNOSIS — R310 Gross hematuria: Secondary | ICD-10-CM | POA: Diagnosis not present

## 2022-09-21 DIAGNOSIS — I10 Essential (primary) hypertension: Secondary | ICD-10-CM | POA: Diagnosis not present

## 2022-09-21 DIAGNOSIS — R7301 Impaired fasting glucose: Secondary | ICD-10-CM | POA: Diagnosis not present

## 2022-09-21 DIAGNOSIS — E785 Hyperlipidemia, unspecified: Secondary | ICD-10-CM | POA: Diagnosis not present

## 2022-09-21 DIAGNOSIS — E871 Hypo-osmolality and hyponatremia: Secondary | ICD-10-CM | POA: Diagnosis not present

## 2022-09-28 DIAGNOSIS — E876 Hypokalemia: Secondary | ICD-10-CM | POA: Diagnosis not present

## 2022-10-04 ENCOUNTER — Other Ambulatory Visit (HOSPITAL_COMMUNITY): Payer: Self-pay

## 2022-10-04 DIAGNOSIS — M5416 Radiculopathy, lumbar region: Secondary | ICD-10-CM | POA: Diagnosis not present

## 2022-10-04 DIAGNOSIS — E039 Hypothyroidism, unspecified: Secondary | ICD-10-CM | POA: Diagnosis not present

## 2022-10-04 DIAGNOSIS — R300 Dysuria: Secondary | ICD-10-CM | POA: Diagnosis not present

## 2022-10-04 DIAGNOSIS — E871 Hypo-osmolality and hyponatremia: Secondary | ICD-10-CM | POA: Diagnosis not present

## 2022-10-04 DIAGNOSIS — R197 Diarrhea, unspecified: Secondary | ICD-10-CM | POA: Diagnosis not present

## 2022-10-04 DIAGNOSIS — G629 Polyneuropathy, unspecified: Secondary | ICD-10-CM | POA: Diagnosis not present

## 2022-10-04 DIAGNOSIS — M810 Age-related osteoporosis without current pathological fracture: Secondary | ICD-10-CM | POA: Diagnosis not present

## 2022-10-05 ENCOUNTER — Encounter (HOSPITAL_COMMUNITY): Payer: Medicare Other

## 2022-10-05 DIAGNOSIS — R300 Dysuria: Secondary | ICD-10-CM | POA: Diagnosis not present

## 2022-10-07 DIAGNOSIS — H532 Diplopia: Secondary | ICD-10-CM | POA: Diagnosis not present

## 2022-10-07 DIAGNOSIS — Z961 Presence of intraocular lens: Secondary | ICD-10-CM | POA: Diagnosis not present

## 2022-10-19 DIAGNOSIS — Z905 Acquired absence of kidney: Secondary | ICD-10-CM | POA: Diagnosis not present

## 2022-10-19 DIAGNOSIS — C652 Malignant neoplasm of left renal pelvis: Secondary | ICD-10-CM | POA: Diagnosis not present

## 2022-10-19 DIAGNOSIS — R300 Dysuria: Secondary | ICD-10-CM | POA: Diagnosis not present

## 2022-11-02 ENCOUNTER — Ambulatory Visit: Payer: Medicare Other | Admitting: Gastroenterology

## 2022-11-11 DIAGNOSIS — M810 Age-related osteoporosis without current pathological fracture: Secondary | ICD-10-CM | POA: Diagnosis not present

## 2022-11-11 DIAGNOSIS — R7301 Impaired fasting glucose: Secondary | ICD-10-CM | POA: Diagnosis not present

## 2022-11-11 DIAGNOSIS — E785 Hyperlipidemia, unspecified: Secondary | ICD-10-CM | POA: Diagnosis not present

## 2022-11-11 DIAGNOSIS — R5383 Other fatigue: Secondary | ICD-10-CM | POA: Diagnosis not present

## 2022-11-11 DIAGNOSIS — E039 Hypothyroidism, unspecified: Secondary | ICD-10-CM | POA: Diagnosis not present

## 2022-11-11 DIAGNOSIS — I10 Essential (primary) hypertension: Secondary | ICD-10-CM | POA: Diagnosis not present

## 2022-11-11 DIAGNOSIS — K219 Gastro-esophageal reflux disease without esophagitis: Secondary | ICD-10-CM | POA: Diagnosis not present

## 2022-11-15 ENCOUNTER — Ambulatory Visit (INDEPENDENT_AMBULATORY_CARE_PROVIDER_SITE_OTHER): Payer: Medicare Other | Admitting: Podiatry

## 2022-11-15 ENCOUNTER — Encounter: Payer: Self-pay | Admitting: Podiatry

## 2022-11-15 DIAGNOSIS — B079 Viral wart, unspecified: Secondary | ICD-10-CM

## 2022-11-16 NOTE — Progress Notes (Signed)
Subjective:   Patient ID: Emily Maxwell, female   DOB: 82 y.o.   MRN: [id number]   HPI Patient presents with pain in the left plantar foot lesion formation not been treated for almost 2 years and states that it just reoccurred over the last few months   ROS      Objective:  Physical Exam  Neurovascular status intact with keratotic lesion x 3 plantar left that upon debridement show pinpoint bleeding pain to lateral pressure     Assessment:  Verruca plantaris plantar left with pain     Plan:  Debrided painful lesions left apply chemical agent to create immune response with sterile dressings explained what to do if any blistering were to occur and reappoint to recheck as symptoms indicate

## 2022-11-18 DIAGNOSIS — I10 Essential (primary) hypertension: Secondary | ICD-10-CM | POA: Diagnosis not present

## 2022-11-18 DIAGNOSIS — R82998 Other abnormal findings in urine: Secondary | ICD-10-CM | POA: Diagnosis not present

## 2022-12-07 DIAGNOSIS — E039 Hypothyroidism, unspecified: Secondary | ICD-10-CM | POA: Diagnosis not present

## 2022-12-07 DIAGNOSIS — M810 Age-related osteoporosis without current pathological fracture: Secondary | ICD-10-CM | POA: Diagnosis not present

## 2022-12-07 DIAGNOSIS — R7301 Impaired fasting glucose: Secondary | ICD-10-CM | POA: Diagnosis not present

## 2022-12-07 DIAGNOSIS — A069 Amebiasis, unspecified: Secondary | ICD-10-CM | POA: Diagnosis not present

## 2022-12-07 DIAGNOSIS — I129 Hypertensive chronic kidney disease with stage 1 through stage 4 chronic kidney disease, or unspecified chronic kidney disease: Secondary | ICD-10-CM | POA: Diagnosis not present

## 2022-12-07 DIAGNOSIS — Z1331 Encounter for screening for depression: Secondary | ICD-10-CM | POA: Diagnosis not present

## 2022-12-07 DIAGNOSIS — N1831 Chronic kidney disease, stage 3a: Secondary | ICD-10-CM | POA: Diagnosis not present

## 2022-12-07 DIAGNOSIS — Z85528 Personal history of other malignant neoplasm of kidney: Secondary | ICD-10-CM | POA: Diagnosis not present

## 2022-12-07 DIAGNOSIS — Z1339 Encounter for screening examination for other mental health and behavioral disorders: Secondary | ICD-10-CM | POA: Diagnosis not present

## 2022-12-07 DIAGNOSIS — E785 Hyperlipidemia, unspecified: Secondary | ICD-10-CM | POA: Diagnosis not present

## 2022-12-07 DIAGNOSIS — Z0000 Encounter for general adult medical examination without abnormal findings: Secondary | ICD-10-CM | POA: Diagnosis not present

## 2022-12-07 DIAGNOSIS — E669 Obesity, unspecified: Secondary | ICD-10-CM | POA: Diagnosis not present

## 2022-12-30 DIAGNOSIS — M65311 Trigger thumb, right thumb: Secondary | ICD-10-CM | POA: Diagnosis not present

## 2022-12-30 DIAGNOSIS — M65312 Trigger thumb, left thumb: Secondary | ICD-10-CM | POA: Diagnosis not present

## 2023-01-18 DIAGNOSIS — L821 Other seborrheic keratosis: Secondary | ICD-10-CM | POA: Diagnosis not present

## 2023-01-18 DIAGNOSIS — L814 Other melanin hyperpigmentation: Secondary | ICD-10-CM | POA: Diagnosis not present

## 2023-01-18 DIAGNOSIS — C44519 Basal cell carcinoma of skin of other part of trunk: Secondary | ICD-10-CM | POA: Diagnosis not present

## 2023-01-18 DIAGNOSIS — D2262 Melanocytic nevi of left upper limb, including shoulder: Secondary | ICD-10-CM | POA: Diagnosis not present

## 2023-01-18 DIAGNOSIS — D2261 Melanocytic nevi of right upper limb, including shoulder: Secondary | ICD-10-CM | POA: Diagnosis not present

## 2023-02-03 DIAGNOSIS — Z5181 Encounter for therapeutic drug level monitoring: Secondary | ICD-10-CM | POA: Diagnosis not present

## 2023-02-03 DIAGNOSIS — Z79899 Other long term (current) drug therapy: Secondary | ICD-10-CM | POA: Diagnosis not present

## 2023-02-03 DIAGNOSIS — M5416 Radiculopathy, lumbar region: Secondary | ICD-10-CM | POA: Diagnosis not present

## 2023-05-31 ENCOUNTER — Ambulatory Visit: Payer: Medicare Other | Admitting: Allergy and Immunology

## 2023-05-31 ENCOUNTER — Encounter: Payer: Self-pay | Admitting: Allergy and Immunology

## 2023-05-31 ENCOUNTER — Other Ambulatory Visit: Payer: Self-pay

## 2023-05-31 VITALS — BP 122/78 | HR 76 | Temp 98.1°F | Resp 16 | Ht 63.0 in | Wt 172.7 lb

## 2023-05-31 DIAGNOSIS — R197 Diarrhea, unspecified: Secondary | ICD-10-CM | POA: Diagnosis not present

## 2023-05-31 MED ORDER — COLESTIPOL HCL 1 G PO TABS: 1.0000 g | ORAL_TABLET | Freq: Every morning | ORAL | 1 refills | Status: DC

## 2023-05-31 NOTE — Progress Notes (Unsigned)
[place] - [place] - [place] - [place] - [place]   Follow-up Note  Referring Provider: Cleatis Polka., MD Primary Provider: Cleatis Polka., MD Date of Office Visit: 05/31/2023  Subjective:   Emily Maxwell (DOB: 1941/03/22) is a 82 y.o. female who [facility] in re-evaluation of the following:  HPI: Emily Maxwell returns to this clinic in evaluation of asthma, allergic rhinitis.  I have not seen her in this clinic since 17 August 2022.  During her last visit she had a febrile illness which turned out to be COVID which was treated with antiviral agents but unfortunately she had a fair amount of GI issues associated with that infection and ended up getting vomiting and diarrhea and then hyponatremia requiring hospitalization.  But fortunately she has no long-term sequela from that infection.  She has had no issues with asthma no issues with allergic rhinitis and that is not her concern today.  Her concern today is diarrhea.  Apparently she has been having years of diarrhea and this appears to be an intermittent issue occurring multiple times per week and she recently went to an integrative medicine clinic and they did IgG food testing and she has a bunch of positive results on that testing.  There is not really a correlation between consuming those foods and developing diarrhea.  She has had a cholecystectomy performed in the past.  Allergies as of 05/31/2023       Reactions   Ketorolac Tromethamine Palpitations   Avelox [moxifloxacin Hcl In Nacl] Other (See Comments)   Unable to describe reaction   Epinephrine Other (See Comments)   Makes heart rate increase and cold sweats   Other Other (See Comments)   Sulfamethoxazole-trimethoprim Other (See Comments)   Tramadol Other (See Comments)   Unable to describe reaction   Penicillins Rash   Childhood rxn from weekly PCN shots w/ asthma Tx Has patient had a PCN reaction causing  immediate rash, facial/tongue/throat swelling, SOB or lightheadedness with hypotension: unknown Has patient had a PCN reaction causing severe rash involving mucus membranes or skin necrosis: unknown Has patient had a PCN reaction that required hospitalization: no   Has patient had a PCN reaction occurring within the last 10 years: no If all of the above answers are "NO", then may proceed with Cephalosporin   Sulfa Antibiotics Rash   Tolerates non-antibiotic sulfa drugs        Medication List    acetaminophen 500 MG tablet Commonly known as: TYLENOL Take 500-1,000 mg by mouth every 6 (six) hours as needed (pain.).   albuterol 108 (90 Base) MCG/ACT inhaler Commonly known as: VENTOLIN HFA Inhale 2 puffs into the lungs every 4 (four) hours as needed for wheezing or shortness of breath.   amLODipine 5 MG tablet Commonly known as: NORVASC Take 1 tablet (5 mg total) by mouth daily.   b complex vitamins capsule Take 1 capsule by mouth daily.   Cholecalciferol 125 MCG (5000 UT) capsule Take 5,000 Units by mouth daily.   doxycycline 100 MG tablet Commonly known as: VIBRA-TABS Take 100 mg by mouth daily.   gabapentin 100 MG capsule Commonly known as: NEURONTIN Take 100 mg by mouth daily as needed (restless leg syndrome).   levothyroxine 100 MCG tablet Commonly known as: SYNTHROID Take 1 tablet by mouth daily.   liothyronine 5 MCG tablet Commonly known as: CYTOMEL Take 5 mcg by mouth in the morning and at bedtime. (0700 &  1200)   loratadine 10 MG tablet Commonly known as: CLARITIN Take 10 mg by mouth daily.   MULTI VITAMIN DAILY PO Take 1 tablet by mouth daily.   urea 15 g Pack oral packet Commonly known as: URE-NA Take 15 g by mouth daily.   Zetia 10 MG tablet Generic drug: ezetimibe Take 10 mg by mouth in the morning.    Past Medical History:  Diagnosis Date   Aneurysm, ascending aorta (HCC) 2013   38mm   Anxiety    hx of  anxiety , had stress test several  years ago per patient that was normal    Arthritis    thumbs , oa   Ascending aorta dilation (HCC) 08/07/2012   Asthma    Cancer (HCC)    cancer left kidney   Cancer of kidney (HCC)    Had right kidney removed   Complication of anesthesia    pulse rate slow in past, did ok with recent anesthesia   Deaf    left ear   Dysrhythmia    occasional arrythmia, no meds taken for   Hematuria    no blood seen recently   Hematuria    History of fatty infiltration of liver    History of hiatal hernia    Hyperlipidemia    Hyperplastic colon polyp    Hypertension    Hypothyroidism    IFG (impaired fasting glucose)    Nodule of left lung    stable, surveillance  Dr Clelia Croft   Numbness    left thigh, when walking or standing for long periods of time   Osteopenia    RLS (restless legs syndrome)    Rosacea    Simple endometrial hyperplasia years ago   Thoracic aortic aneurysm (HCC)    stable per last chest ct 07-11-14 [facility] on chart-    Vasovagal reaction    hx of, triggers after eating once you have fasted    Past Surgical History:  Procedure Laterality Date   acoustic neuroma S/P resection Lt ear deafness 1990     tinnitus  from left ear   BTL     BUNIONECTOMY Bilateral    x 2   CATARACT EXTRACTION Bilateral 2004   CRANIECTOMY FOR EXCISION OF ACOUSTIC NEUROMA Left 1990   Deaf on Left side   CYSTOSCOPY WITH RETROGRADE PYELOGRAM, URETEROSCOPY AND STENT PLACEMENT Bilateral 08/07/2015   Procedure: CYSTOSCOPY WITH BILATERAL RETROGRADE PYELOGRAM, LEFT URETEROSCOPY WITH BIOPSY AND  LEFT STENT PLACEMENT;  Surgeon: Sebastian Ache, MD;  Location: [facility];  Service: Urology;  Laterality: Bilateral;   EXCISION MORTON'S NEUROMA  2010   left foot 3rd and 4th toe   EYE SURGERY Bilateral 2013   eyelid drooping fixed   EYE SURGERY Bilateral    yag procedure after cataracts   left hand middle finger surgery   2015   cadaver bone placed    left shoulder surgery 2005     Lt knee surgery 2008  Left 2008   tears twice one surgery   REVERSE SHOULDER ARTHROPLASTY Left 02/18/2022   Procedure: REVERSE SHOULDER ARTHROPLASTY;  Surgeon: Beverely Low, MD;  Location: [facility];  Service: Orthopedics;  Laterality: Left;  with ISB   ROBOT ASSITED LAPAROSCOPIC NEPHROURETERECTOMY Left 09/19/2015   Procedure: ROBOT ASSITED LAPAROSCOPIC NEPHROURETERECTOMY WITH DIAPHRAMATIC HERNIA REPAIR;  Surgeon: Sebastian Ache, MD;  Location: [facility];  Service: Urology;  Laterality: Left;   SP CHOLECYSTOMY     THYROIDECTOMY  11/2009   S/P Adenoma follicular vs  hyperplastic   TONSILLECTOMY  age 54   TUBAL LIGATION  1976   WRIST SURGERY Left 2007   wrist cyst removed    Review of systems negative except as noted in HPI / PMHx or noted below:  Review of Systems  Constitutional: Negative.   HENT: Negative.    Eyes: Negative.   Respiratory: Negative.    Cardiovascular: Negative.   Gastrointestinal: Negative.   Genitourinary: Negative.   Musculoskeletal: Negative.   Skin: Negative.   Neurological: Negative.   Endo/Heme/Allergies: Negative.   Psychiatric/Behavioral: Negative.       Objective:   Vitals:   05/31/23 1330  BP: 122/78  Pulse: 76  Resp: 16  Temp: 98.1 F (36.7 C)  SpO2: 95%   Height: 5\' 3"  (160 cm)  Weight: 172 lb 11.2 oz (78.3 kg)   Physical Exam -deferred  Diagnostics: none  Assessment and Plan:   1. Diarrhea, unspecified type    1.  Return for skin testing without antihistamine  2.  Stop fish oil  3.  Colestipol 1 g - 1 tablet 1 time per dasy  4.  Further evaluation???  Melda has chronic diarrhea and the exact cause of that issue is unknown.  We will see if there is a food allergy tied up with this issue when she returns for skin testing.  For now we will have her stop her fish oil and use colestipol.  I have asked her not to use the colestipol around the same time she uses her thyroid medicine.  Laurette Schimke, MD Allergy / Immunology [facility]

## 2023-05-31 NOTE — Patient Instructions (Addendum)
  1.  Return for skin testing without antihistamine  2.  Stop fish oil  3.  Colestipol 1 g - 1/2-1 tablet 1 time per dasy  4. Further evaluation???

## 2023-06-01 ENCOUNTER — Encounter: Payer: Self-pay | Admitting: Allergy and Immunology

## 2023-06-02 DIAGNOSIS — M48061 Spinal stenosis, lumbar region without neurogenic claudication: Secondary | ICD-10-CM | POA: Diagnosis not present

## 2023-06-02 DIAGNOSIS — M5416 Radiculopathy, lumbar region: Secondary | ICD-10-CM | POA: Diagnosis not present

## 2023-06-10 ENCOUNTER — Ambulatory Visit (INDEPENDENT_AMBULATORY_CARE_PROVIDER_SITE_OTHER): Payer: Medicare Other | Admitting: Family Medicine

## 2023-06-10 ENCOUNTER — Other Ambulatory Visit: Payer: Self-pay

## 2023-06-10 ENCOUNTER — Encounter: Payer: Self-pay | Admitting: Family Medicine

## 2023-06-10 VITALS — BP 134/60 | HR 66 | Temp 97.5°F | Resp 16 | Wt 172.3 lb

## 2023-06-10 DIAGNOSIS — T7800XA Anaphylactic reaction due to unspecified food, initial encounter: Secondary | ICD-10-CM | POA: Diagnosis not present

## 2023-06-10 NOTE — Patient Instructions (Addendum)
Food allergy Your skin testing was borderline positive to cashew, pistachio, [place] nut, and white potato.  Continue to avoid these foods as listed above in addition to cow's milk, beets, carrot, tumeric, and corn A lab order has been placed to help [place] evaluate your food allergies Call the clinic if you change your mind and would like an epinephrine auto injector set.  Call the clinic if this treatment plan is not working well for you.  Follow up in 3 months or sooner if needed.    Allergy: food allergy is when you have eaten a food, developed an allergic reaction after eating the food and have IgE to the food (positive food testing either by skin testing or blood testing).  Food allergy could lead to life threatening symptoms  Sensitivity: occurs when you have IgE to a food (positive food testing either by skin testing or blood testing) but is a food you eat without any issues.  This is not an allergy and we recommend keeping the food in the diet  Intolerance: this is when you have negative testing by either skin testing or blood testing thus not allergic but the food causes symptoms (like belly pain, bloating, diarrhea etc) with ingestion.  These foods should be avoided to prevent symptoms.

## 2023-06-10 NOTE — Progress Notes (Addendum)
[address] [place] [address] Dept: [phone]  FOLLOW UP NOTE  Patient ID: Emily Maxwell, female    DOB: 01-13-1941  Age: 82 y.o. MRN: [id number] Date of Office Visit: 06/10/2023  Assessment  Chief Complaint: Allergy Testing  HPI Emily Maxwell is an 82 year old female who presents to the clinic for follow-up visit with food allergy testing.  She was last seen in this clinic on 05/31/2023 by Dr. Lucie Leather for evaluation of longstanding diarrhea.  At that visit he discontinued fish oil and have the patient begin colestipol.  At today's visit, she reports that she continues to experience gastrointestinal symptoms mainly in the form of soft stool and diarrhea after consuming solid foods. She reports that she has had intermittent diarrhea over the last several years and has recently been to [facility] in [place] where she had IgG food testing.  She has been trying to avoid the foods that were indicated to be positive IgG on blood testing.  She reports some success in decreasing the amount of diarrhea, however, is interested in further testing for IgE mediated reaction. She noted that yesterday she consumed milk, ice cream, and heavy cream and has had stool equivalent to [facility] stool scale #6 over the last day.  She reports that she is feeling well at today's visit with no cardiopulmonary, gastrointestinal, or integumentary symptoms.  She has not had any antihistamines over the last 3 days.  Her current medications are listed in the chart.Of note, the patient had several foods that were borderline positive. She reports that she does experience frequent diarrhea, however, does not experience cardiopulmonary or integumentary symptoms, therefore, she is not interested in having an epinephrine auto-injector set at this time. She reports that she will call the clinic if she decides that she would like to have an epinephrine auto-injector set.    Drug Allergies:  Allergies   Allergen Reactions   Ketorolac Tromethamine Palpitations   Avelox [Moxifloxacin Hcl In Nacl] Other (See Comments)    Unable to describe reaction   Epinephrine Other (See Comments)    Makes heart rate increase and cold sweats   Other Other (See Comments)   Sulfamethoxazole-Trimethoprim Other (See Comments)   Tramadol Other (See Comments)    Unable to describe reaction   Penicillins Rash    Childhood rxn from weekly PCN shots w/ asthma Tx Has patient had a PCN reaction causing immediate rash, facial/tongue/throat swelling, SOB or lightheadedness with hypotension: unknown Has patient had a PCN reaction causing severe rash involving mucus membranes or skin necrosis: unknown Has patient had a PCN reaction that required hospitalization: no   Has patient had a PCN reaction occurring within the last 10 years: no If all of the above answers are "NO", then may proceed with Cephalosporin   Sulfa Antibiotics Rash    Tolerates non-antibiotic sulfa drugs    Physical Exam: BP 134/60   Pulse 66   Temp (!) 97.5 F (36.4 C) (Temporal)   Resp 16   Wt 172 lb 4.8 oz (78.2 kg)   LMP 07/11/1983   SpO2 97%   BMI 30.52 kg/m    Physical Exam Vitals reviewed.  Constitutional:      Appearance: Normal appearance.  HENT:     Head: Normocephalic and atraumatic.     Right Ear: Tympanic membrane normal.     Left Ear: Tympanic membrane normal.     Nose:     Comments: Bilateral nares slightly erythematous with clear nasal drainage noted. Pharynx  normal. Ears normal. Eyes normal.    Mouth/Throat:     Pharynx: Oropharynx is clear.  Eyes:     Conjunctiva/sclera: Conjunctivae normal.  Cardiovascular:     Rate and Rhythm: Normal rate and regular rhythm.     Heart sounds: Normal heart sounds. No murmur heard. Pulmonary:     Effort: Pulmonary effort is normal.     Breath sounds: Normal breath sounds.     Comments: Lungs clear to auscultation Musculoskeletal:        General: Normal range of motion.      Cervical back: Normal range of motion and neck supple.  Skin:    General: Skin is warm and dry.  Neurological:     Mental Status: She is alert and oriented to person, place, and time.  Psychiatric:        Mood and Affect: Mood normal.        Behavior: Behavior normal.        Thought Content: Thought content normal.        Judgment: Judgment normal.     Diagnostics: Percutaneous selected foods were borderline positive to cashew, pistachio, [place] nut, and white potato with adequate controls.   Assessment and Plan: 1. Allergy with anaphylaxis due to food     Patient Instructions  Food allergy Your skin testing was borderline positive to cashew, pistachio, [place] nut, and white potato.  Continue to avoid these foods as listed above in addition to cow's milk, beets, carrot, tumeric, and corn A lab order has been placed to help [place] evaluate your food allergies Call the clinic if you change your mind and would like an epinephrine auto injector set.  Call the clinic if this treatment plan is not working well for you.  Follow up in 3 months or sooner if needed.    Allergy: food allergy is when you have eaten a food, developed an allergic reaction after eating the food and have IgE to the food (positive food testing either by skin testing or blood testing).  Food allergy could lead to life threatening symptoms  Sensitivity: occurs when you have IgE to a food (positive food testing either by skin testing or blood testing) but is a food you eat without any issues.  This is not an allergy and we recommend keeping the food in the diet  Intolerance: this is when you have negative testing by either skin testing or blood testing thus not allergic but the food causes symptoms (like belly pain, bloating, diarrhea etc) with ingestion.  These foods should be avoided to prevent symptoms.     Return in about 3 months (around 09/10/2023), or if symptoms worsen or fail to improve.    Thank you for the  opportunity to care for this patient.  Please do not hesitate to contact me with questions.  Thermon Leyland, FNP [facility]

## 2023-06-13 NOTE — Progress Notes (Signed)
Visit complete.

## 2023-06-18 LAB — ALLERGEN PISTACHIO F203: F203-IgE Pistachio Nut: <0.1 kU/L

## 2023-06-18 LAB — ALLERGENS(7)
Brazil Nut IgE: <0.1 kU/L
F020-IgE Almond: <0.1 kU/L
F202-IgE Cashew Nut: <0.1 kU/L
Hazelnut (Filbert) IgE: <0.1 kU/L
Peanut IgE: <0.1 kU/L
Pecan Nut IgE: <0.1 kU/L
Walnut IgE: <0.1 kU/L

## 2023-06-18 LAB — IGE MILK W/ COMPONENT REFLEX: F002-IgE Milk: 0.2 kU/L — AB

## 2023-06-18 LAB — ALLERGEN, BEET: Red Beet: <0.1 kU/L

## 2023-06-18 LAB — ALLERGEN, TURMERIC, IGE
Class Interpretation: 0
Turmeric IgE*: <0.35 kU/L (ref <0.35)

## 2023-06-18 LAB — ALLERGEN CARROT: Allergen Carrot IgE: <0.1 kU/L

## 2023-06-18 LAB — ALLERGEN, CORN F8: Allergen Corn, IgE: 0.34 kU/L — AB

## 2023-06-21 MED ORDER — EPINEPHRINE 0.3 MG/0.3ML IJ SOAJ: 0.3000 mg | INTRAMUSCULAR | 1 refills | Status: DC | PRN

## 2023-06-21 NOTE — Progress Notes (Signed)
Can you please let this patient know that the lab results are back. Milk and corn were borderline positive. Tree nuts, tumeric, beet, and carrot were negative. However, tree nuts were positive to skin testing. So I would avoid tree nuts, milk, corn, and white potato at this time. Thank you

## 2023-06-21 NOTE — Addendum Note (Signed)
Addended by: Robet Leu A on: 06/21/2023 07:18 PM   Modules accepted: Orders

## 2023-06-27 DIAGNOSIS — Z23 Encounter for immunization: Secondary | ICD-10-CM | POA: Diagnosis not present

## 2023-06-28 ENCOUNTER — Ambulatory Visit: Payer: Medicare Other | Admitting: Allergy and Immunology

## 2023-06-30 DIAGNOSIS — H6121 Impacted cerumen, right ear: Secondary | ICD-10-CM | POA: Diagnosis not present

## 2023-06-30 DIAGNOSIS — H903 Sensorineural hearing loss, bilateral: Secondary | ICD-10-CM | POA: Diagnosis not present

## 2023-06-30 DIAGNOSIS — H838X3 Other specified diseases of inner ear, bilateral: Secondary | ICD-10-CM | POA: Diagnosis not present

## 2023-07-23 DIAGNOSIS — Z23 Encounter for immunization: Secondary | ICD-10-CM | POA: Diagnosis not present

## 2023-07-29 DIAGNOSIS — Z23 Encounter for immunization: Secondary | ICD-10-CM | POA: Diagnosis not present

## 2023-08-17 DIAGNOSIS — L570 Actinic keratosis: Secondary | ICD-10-CM | POA: Diagnosis not present

## 2023-08-17 DIAGNOSIS — L111 Transient acantholytic dermatosis [Grover]: Secondary | ICD-10-CM | POA: Diagnosis not present

## 2023-08-17 DIAGNOSIS — L821 Other seborrheic keratosis: Secondary | ICD-10-CM | POA: Diagnosis not present

## 2023-08-24 DIAGNOSIS — M542 Cervicalgia: Secondary | ICD-10-CM | POA: Diagnosis not present

## 2023-08-24 DIAGNOSIS — R0789 Other chest pain: Secondary | ICD-10-CM | POA: Diagnosis not present

## 2023-08-24 DIAGNOSIS — T781XXA Other adverse food reactions, not elsewhere classified, initial encounter: Secondary | ICD-10-CM | POA: Diagnosis not present

## 2023-08-28 DIAGNOSIS — M48061 Spinal stenosis, lumbar region without neurogenic claudication: Secondary | ICD-10-CM | POA: Diagnosis not present

## 2023-08-28 DIAGNOSIS — M546 Pain in thoracic spine: Secondary | ICD-10-CM | POA: Diagnosis not present

## 2023-08-28 DIAGNOSIS — M5416 Radiculopathy, lumbar region: Secondary | ICD-10-CM | POA: Diagnosis not present

## 2023-09-08 ENCOUNTER — Ambulatory Visit (HOSPITAL_COMMUNITY)
Admission: RE | Admit: 2023-09-08 | Discharge: 2023-09-08 | Disposition: A | Discharge status: Home / Self Care | Payer: Medicare Other | Source: Ambulatory Visit | Attending: Urology | Admitting: Urology

## 2023-09-08 ENCOUNTER — Other Ambulatory Visit (HOSPITAL_COMMUNITY): Payer: Self-pay | Admitting: Urology

## 2023-09-08 DIAGNOSIS — C652 Malignant neoplasm of left renal pelvis: Secondary | ICD-10-CM | POA: Diagnosis not present

## 2023-09-08 DIAGNOSIS — Z905 Acquired absence of kidney: Secondary | ICD-10-CM | POA: Diagnosis not present

## 2023-09-08 DIAGNOSIS — Z96612 Presence of left artificial shoulder joint: Secondary | ICD-10-CM | POA: Diagnosis not present

## 2023-09-09 DIAGNOSIS — Z1231 Encounter for screening mammogram for malignant neoplasm of breast: Secondary | ICD-10-CM | POA: Diagnosis not present

## 2023-09-09 DIAGNOSIS — Z124 Encounter for screening for malignant neoplasm of cervix: Secondary | ICD-10-CM | POA: Diagnosis not present

## 2023-09-09 DIAGNOSIS — Z6830 Body mass index (BMI) 30.0-30.9, adult: Secondary | ICD-10-CM | POA: Diagnosis not present

## 2023-09-13 ENCOUNTER — Encounter: Payer: Self-pay | Admitting: Allergy and Immunology

## 2023-09-13 ENCOUNTER — Ambulatory Visit (INDEPENDENT_AMBULATORY_CARE_PROVIDER_SITE_OTHER): Payer: Medicare Other | Admitting: Allergy and Immunology

## 2023-09-13 ENCOUNTER — Other Ambulatory Visit: Payer: Self-pay

## 2023-09-13 VITALS — BP 140/82 | HR 71 | Temp 98.3°F | Ht 63.0 in | Wt 152.0 lb

## 2023-09-13 DIAGNOSIS — K5730 Diverticulosis of large intestine without perforation or abscess without bleeding: Secondary | ICD-10-CM | POA: Diagnosis not present

## 2023-09-13 DIAGNOSIS — Z8709 Personal history of other diseases of the respiratory system: Secondary | ICD-10-CM

## 2023-09-13 DIAGNOSIS — K9049 Malabsorption due to intolerance, not elsewhere classified: Secondary | ICD-10-CM

## 2023-09-13 DIAGNOSIS — J3089 Other allergic rhinitis: Secondary | ICD-10-CM

## 2023-09-13 DIAGNOSIS — Z905 Acquired absence of kidney: Secondary | ICD-10-CM | POA: Diagnosis not present

## 2023-09-13 DIAGNOSIS — D259 Leiomyoma of uterus, unspecified: Secondary | ICD-10-CM | POA: Diagnosis not present

## 2023-09-13 DIAGNOSIS — C652 Malignant neoplasm of left renal pelvis: Secondary | ICD-10-CM | POA: Diagnosis not present

## 2023-09-13 MED ORDER — MONTELUKAST SODIUM 10 MG PO TABS: 10.0000 mg | ORAL_TABLET | Freq: Every day | ORAL | 11 refills | Status: DC

## 2023-09-13 NOTE — Patient Instructions (Addendum)
  1. Continue loratadine 10 mg daily  2. Use montelukast 10 mg daily if needed  3. Return to clinic in 1 year or earlier if problem  4. Avoid potato, corn, milk, pistachio, cashew

## 2023-09-13 NOTE — Progress Notes (Signed)
[place] - [place] - [place] - [place] - [place]   Follow-up Note  Referring Provider: Cleatis Polka., MD Primary Provider: Cleatis Polka., MD Date of Office Visit: 09/13/2023  Subjective:   Emily Maxwell (DOB: July 12, 1941) is a 82 y.o. female who [facility] in re-evaluation of the following:  HPI: Emily Maxwell returns to this clinic in evaluation of asthma, allergic rhinitis, diarrhea.  I last saw her in this clinic 31 May 2023.  When she was last seen in this clinic she was having problems with diarrhea and we recommended that she used colestipol.  She did not use any colestipol.  She has undergone a restrictive diet with elimination of potatoes, corn, milk, pistachio, cashew, and she has been using Benefiber and her diarrhea is better.  She has had very little issues with her airway and does not really use any medications other than loratadine.  She has not had to use the short acting bronchodilator greater than 1 time per month.  On occasion if she is exposed to cats she will use some Singulair for nasal issues and for a slight cough.  She has received the RSV vaccine, flu vaccine, Prevnar 20, and COVID-vaccine.  Allergies as of 09/13/2023       Reactions   Ketorolac Tromethamine Palpitations   Avelox [moxifloxacin Hcl In Nacl] Other (See Comments)   Unable to describe reaction   Epinephrine Other (See Comments)   Makes heart rate increase and cold sweats   Other Other (See Comments)   Sulfamethoxazole-trimethoprim Other (See Comments)   Tramadol Other (See Comments)   Unable to describe reaction   Penicillins Rash   Childhood rxn from weekly PCN shots w/ asthma Tx Has patient had a PCN reaction causing immediate rash, facial/tongue/throat swelling, SOB or lightheadedness with hypotension: unknown Has patient had a PCN reaction causing severe rash involving mucus membranes or skin necrosis: unknown Has patient had  a PCN reaction that required hospitalization: no   Has patient had a PCN reaction occurring within the last 10 years: no If all of the above answers are "NO", then may proceed with Cephalosporin   Sulfa Antibiotics Rash   Tolerates non-antibiotic sulfa drugs        Medication List    acetaminophen 500 MG tablet Commonly known as: TYLENOL Take 500-1,000 mg by mouth every 6 (six) hours as needed (pain.).   albuterol 108 (90 Base) MCG/ACT inhaler Commonly known as: VENTOLIN HFA Inhale 2 puffs into the lungs every 4 (four) hours as needed for wheezing or shortness of breath.   amLODipine 5 MG tablet Commonly known as: NORVASC Take 1 tablet (5 mg total) by mouth daily.   b complex vitamins capsule Take 1 capsule by mouth daily.   Cholecalciferol 125 MCG (5000 UT) capsule Take 5,000 Units by mouth daily.   colestipol 1 g tablet Commonly known as: COLESTID Take 1 tablet (1 g total) by mouth in the morning.   doxycycline 100 MG tablet Commonly known as: VIBRA-TABS Take 100 mg by mouth daily.   EPINEPHrine 0.3 mg/0.3 mL Soaj injection Commonly known as: EpiPen 2-Pak Inject 0.3 mg into the muscle as needed for anaphylaxis.   gabapentin 100 MG capsule Commonly known as: NEURONTIN Take 100 mg by mouth daily as needed (restless leg syndrome).   HYDROcodone-acetaminophen 10-325 MG tablet Commonly known as: NORCO Take 1 tablet by mouth every 6 (six) hours as needed.   levothyroxine 100 MCG  tablet Commonly known as: SYNTHROID Take 1 tablet by mouth daily.   liothyronine 5 MCG tablet Commonly known as: CYTOMEL Take 5 mcg by mouth in the morning and at bedtime. (0700 & 1200)   loratadine 10 MG tablet Commonly known as: CLARITIN Take 10 mg by mouth daily.   montelukast 10 MG tablet Commonly known as: SINGULAIR Take 1 tablet (10 mg total) by mouth at bedtime. Started by: Ryenn Howeth J Arlind Klingerman   MULTI VITAMIN DAILY PO Take 1 tablet by mouth daily.   urea 15 g Pack oral  packet Commonly known as: URE-NA Take 15 g by mouth daily.   Zetia 10 MG tablet Generic drug: ezetimibe Take 10 mg by mouth in the morning.      Past Medical History:  Diagnosis Date   Aneurysm, ascending aorta (HCC) 2013   38mm   Anxiety    hx of  anxiety , had stress test several years ago per patient that was normal    Arthritis    thumbs , oa   Ascending aorta dilation (HCC) 08/07/2012   Asthma    Cancer (HCC)    cancer left kidney   Cancer of kidney (HCC)    Had right kidney removed   Complication of anesthesia    pulse rate slow in past, did ok with recent anesthesia   Deaf    left ear   Dysrhythmia    occasional arrythmia, no meds taken for   Hematuria    no blood seen recently   Hematuria    History of fatty infiltration of liver    History of hiatal hernia    Hyperlipidemia    Hyperplastic colon polyp    Hypertension    Hypothyroidism    IFG (impaired fasting glucose)    Nodule of left lung    stable, surveillance  Dr Clelia Croft   Numbness    left thigh, when walking or standing for long periods of time   Osteopenia    RLS (restless legs syndrome)    Rosacea    Simple endometrial hyperplasia years ago   Thoracic aortic aneurysm (HCC)    stable per last chest ct 07-11-14 [facility] on chart-    Vasovagal reaction    hx of, triggers after eating once you have fasted    Past Surgical History:  Procedure Laterality Date   acoustic neuroma S/P resection Lt ear deafness 1990     tinnitus  from left ear   BTL     BUNIONECTOMY Bilateral    x 2   CATARACT EXTRACTION Bilateral 2004   CRANIECTOMY FOR EXCISION OF ACOUSTIC NEUROMA Left 1990   Deaf on Left side   CYSTOSCOPY WITH RETROGRADE PYELOGRAM, URETEROSCOPY AND STENT PLACEMENT Bilateral 08/07/2015   Procedure: CYSTOSCOPY WITH BILATERAL RETROGRADE PYELOGRAM, LEFT URETEROSCOPY WITH BIOPSY AND  LEFT STENT PLACEMENT;  Surgeon: Sebastian Ache, MD;  Location: [facility];  Service: Urology;  Laterality: Bilateral;    EXCISION MORTON'S NEUROMA  2010   left foot 3rd and 4th toe   EYE SURGERY Bilateral 2013   eyelid drooping fixed   EYE SURGERY Bilateral    yag procedure after cataracts   left hand middle finger surgery   2015   cadaver bone placed    left shoulder surgery 2005     Lt knee surgery 2008 Left 2008   tears twice one surgery   REVERSE SHOULDER ARTHROPLASTY Left 02/18/2022   Procedure: REVERSE SHOULDER ARTHROPLASTY;  Surgeon: Beverely Low, MD;  Location: [facility];  Service: Orthopedics;  Laterality: Left;  with ISB   ROBOT ASSITED LAPAROSCOPIC NEPHROURETERECTOMY Left 09/19/2015   Procedure: ROBOT ASSITED LAPAROSCOPIC NEPHROURETERECTOMY WITH DIAPHRAMATIC HERNIA REPAIR;  Surgeon: Sebastian Ache, MD;  Location: [facility];  Service: Urology;  Laterality: Left;   SP CHOLECYSTOMY     THYROIDECTOMY  11/2009   S/P Adenoma follicular vs hyperplastic   TONSILLECTOMY  age 20   TUBAL LIGATION  1976   WRIST SURGERY Left 2007   wrist cyst removed    Review of systems negative except as noted in HPI / PMHx or noted below:  Review of Systems  Constitutional: Negative.   HENT: Negative.    Eyes: Negative.   Respiratory: Negative.    Cardiovascular: Negative.   Gastrointestinal: Negative.   Genitourinary: Negative.   Musculoskeletal: Negative.   Skin: Negative.   Neurological: Negative.   Endo/Heme/Allergies: Negative.   Psychiatric/Behavioral: Negative.       Objective:   Vitals:   09/13/23 1511  BP: (!) 140/82  Pulse: 71  Temp: 98.3 F (36.8 C)  SpO2: 96%   Height: 5\' 3"  (160 cm)  Weight: 152 lb (68.9 kg)   Physical Exam Constitutional:      Appearance: She is not diaphoretic.  HENT:     Head: Normocephalic.     Right Ear: Tympanic membrane, ear canal and external ear normal.     Left Ear: Tympanic membrane, ear canal and external ear normal.     Nose: Nose normal. No mucosal edema or rhinorrhea.     Mouth/Throat:     Pharynx: Uvula midline. No oropharyngeal exudate.  Eyes:      Conjunctiva/sclera: Conjunctivae normal.  Neck:     Thyroid: No thyromegaly.     Trachea: Trachea normal. No tracheal tenderness or tracheal deviation.  Cardiovascular:     Rate and Rhythm: Normal rate and regular rhythm.     Heart sounds: Normal heart sounds, S1 normal and S2 normal. No murmur heard. Pulmonary:     Effort: No respiratory distress.     Breath sounds: Normal breath sounds. No stridor. No wheezing or rales.  Lymphadenopathy:     Head:     Right side of head: No tonsillar adenopathy.     Left side of head: No tonsillar adenopathy.     Cervical: No cervical adenopathy.  Skin:    Findings: No erythema or rash.     Nails: There is no clubbing.  Neurological:     Mental Status: She is alert.     Diagnostics: none  Assessment and Plan:   1. History of asthma   2. Other allergic rhinitis   3. Food intolerance    1. Continue loratadine 10 mg daily  2. Use montelukast 10 mg daily and albuterol HFA if needed  3. Return to clinic in 1 year or earlier if problem  4. Avoid potato, corn, milk, pistachio, cashew  Skylarr appears to have her diarrhea and her food intolerance send her allergic rhinitis and her very distant history of asthma under good control on her current plan and she can continue on a H2 receptor blocker and if needed a leukotriene modifier and continue on her restrictive diet and we will see her back in this clinic in 1 year or earlier if there is a problem.  Laurette Schimke, MD Allergy / Immunology [facility]

## 2023-09-14 ENCOUNTER — Encounter: Payer: Self-pay | Admitting: Allergy and Immunology

## 2023-09-19 DIAGNOSIS — Z905 Acquired absence of kidney: Secondary | ICD-10-CM | POA: Diagnosis not present

## 2023-09-19 DIAGNOSIS — C652 Malignant neoplasm of left renal pelvis: Secondary | ICD-10-CM | POA: Diagnosis not present

## 2023-10-06 DIAGNOSIS — M5416 Radiculopathy, lumbar region: Secondary | ICD-10-CM | POA: Diagnosis not present

## 2023-10-06 DIAGNOSIS — M546 Pain in thoracic spine: Secondary | ICD-10-CM | POA: Diagnosis not present

## 2023-11-01 DIAGNOSIS — H532 Diplopia: Secondary | ICD-10-CM | POA: Diagnosis not present

## 2023-12-12 DIAGNOSIS — I10 Essential (primary) hypertension: Secondary | ICD-10-CM | POA: Diagnosis not present

## 2023-12-12 DIAGNOSIS — E039 Hypothyroidism, unspecified: Secondary | ICD-10-CM | POA: Diagnosis not present

## 2023-12-12 DIAGNOSIS — N1831 Chronic kidney disease, stage 3a: Secondary | ICD-10-CM | POA: Diagnosis not present

## 2023-12-12 DIAGNOSIS — R7301 Impaired fasting glucose: Secondary | ICD-10-CM | POA: Diagnosis not present

## 2023-12-12 DIAGNOSIS — E041 Nontoxic single thyroid nodule: Secondary | ICD-10-CM | POA: Diagnosis not present

## 2023-12-12 DIAGNOSIS — Z0189 Encounter for other specified special examinations: Secondary | ICD-10-CM | POA: Diagnosis not present

## 2023-12-12 DIAGNOSIS — M810 Age-related osteoporosis without current pathological fracture: Secondary | ICD-10-CM | POA: Diagnosis not present

## 2023-12-12 DIAGNOSIS — E785 Hyperlipidemia, unspecified: Secondary | ICD-10-CM | POA: Diagnosis not present

## 2023-12-12 DIAGNOSIS — I129 Hypertensive chronic kidney disease with stage 1 through stage 4 chronic kidney disease, or unspecified chronic kidney disease: Secondary | ICD-10-CM | POA: Diagnosis not present

## 2023-12-19 DIAGNOSIS — I129 Hypertensive chronic kidney disease with stage 1 through stage 4 chronic kidney disease, or unspecified chronic kidney disease: Secondary | ICD-10-CM | POA: Diagnosis not present

## 2023-12-19 DIAGNOSIS — I10 Essential (primary) hypertension: Secondary | ICD-10-CM | POA: Diagnosis not present

## 2023-12-19 DIAGNOSIS — K581 Irritable bowel syndrome with constipation: Secondary | ICD-10-CM | POA: Diagnosis not present

## 2023-12-19 DIAGNOSIS — J45909 Unspecified asthma, uncomplicated: Secondary | ICD-10-CM | POA: Diagnosis not present

## 2023-12-19 DIAGNOSIS — E669 Obesity, unspecified: Secondary | ICD-10-CM | POA: Diagnosis not present

## 2023-12-19 DIAGNOSIS — R82998 Other abnormal findings in urine: Secondary | ICD-10-CM | POA: Diagnosis not present

## 2023-12-19 DIAGNOSIS — N1831 Chronic kidney disease, stage 3a: Secondary | ICD-10-CM | POA: Diagnosis not present

## 2023-12-19 DIAGNOSIS — E785 Hyperlipidemia, unspecified: Secondary | ICD-10-CM | POA: Diagnosis not present

## 2023-12-19 DIAGNOSIS — R7301 Impaired fasting glucose: Secondary | ICD-10-CM | POA: Diagnosis not present

## 2023-12-19 DIAGNOSIS — E039 Hypothyroidism, unspecified: Secondary | ICD-10-CM | POA: Diagnosis not present

## 2023-12-19 DIAGNOSIS — M5416 Radiculopathy, lumbar region: Secondary | ICD-10-CM | POA: Diagnosis not present

## 2023-12-19 DIAGNOSIS — J309 Allergic rhinitis, unspecified: Secondary | ICD-10-CM | POA: Diagnosis not present

## 2023-12-19 DIAGNOSIS — Z85528 Personal history of other malignant neoplasm of kidney: Secondary | ICD-10-CM | POA: Diagnosis not present

## 2023-12-19 DIAGNOSIS — Z1339 Encounter for screening examination for other mental health and behavioral disorders: Secondary | ICD-10-CM | POA: Diagnosis not present

## 2023-12-19 DIAGNOSIS — Z0000 Encounter for general adult medical examination without abnormal findings: Secondary | ICD-10-CM | POA: Diagnosis not present

## 2023-12-19 DIAGNOSIS — Z1331 Encounter for screening for depression: Secondary | ICD-10-CM | POA: Diagnosis not present

## 2023-12-19 DIAGNOSIS — M810 Age-related osteoporosis without current pathological fracture: Secondary | ICD-10-CM | POA: Diagnosis not present

## 2023-12-19 DIAGNOSIS — G2581 Restless legs syndrome: Secondary | ICD-10-CM | POA: Diagnosis not present

## 2024-01-23 DIAGNOSIS — L718 Other rosacea: Secondary | ICD-10-CM | POA: Diagnosis not present

## 2024-01-23 DIAGNOSIS — L218 Other seborrheic dermatitis: Secondary | ICD-10-CM | POA: Diagnosis not present

## 2024-01-23 DIAGNOSIS — L570 Actinic keratosis: Secondary | ICD-10-CM | POA: Diagnosis not present

## 2024-01-23 DIAGNOSIS — L814 Other melanin hyperpigmentation: Secondary | ICD-10-CM | POA: Diagnosis not present

## 2024-01-23 DIAGNOSIS — L821 Other seborrheic keratosis: Secondary | ICD-10-CM | POA: Diagnosis not present

## 2024-01-23 DIAGNOSIS — Z85828 Personal history of other malignant neoplasm of skin: Secondary | ICD-10-CM | POA: Diagnosis not present

## 2024-01-23 DIAGNOSIS — C44612 Basal cell carcinoma of skin of right upper limb, including shoulder: Secondary | ICD-10-CM | POA: Diagnosis not present

## 2024-02-22 ENCOUNTER — Encounter: Payer: Self-pay | Admitting: Allergy and Immunology

## 2024-02-22 ENCOUNTER — Ambulatory Visit (INDEPENDENT_AMBULATORY_CARE_PROVIDER_SITE_OTHER): Admitting: Allergy and Immunology

## 2024-02-22 VITALS — BP 134/74 | HR 72 | Resp 14 | Ht 63.0 in | Wt 177.2 lb

## 2024-02-22 DIAGNOSIS — J069 Acute upper respiratory infection, unspecified: Secondary | ICD-10-CM

## 2024-02-22 DIAGNOSIS — Z8709 Personal history of other diseases of the respiratory system: Secondary | ICD-10-CM

## 2024-02-22 DIAGNOSIS — J3089 Other allergic rhinitis: Secondary | ICD-10-CM

## 2024-02-22 MED ORDER — METHYLPREDNISOLONE ACETATE 80 MG/ML IJ SUSP
80.0000 mg | Freq: Once | INTRAMUSCULAR | Status: AC
  Administered 2024-02-22: 80 mg via INTRAMUSCULAR

## 2024-02-22 NOTE — Patient Instructions (Addendum)
  1. Continue the following if needed:  A. loratadine  10 mg daily B. montelukast  10 mg daily  C. albuterol  - 2 inhalations every 6 hours  2.  For this recent event:   A. Depomedrol 80 IM delivered in clinic today  B. Restart Montelukast  10 mg + Loratadine  10 - daily  C. Mucinex  DM - 2 times per day  D. Nasal saline a few times per day  E. Further treatment???  3. Return to clinic in 1 year or earlier if problem

## 2024-02-22 NOTE — Progress Notes (Unsigned)
 [place] - [place] - [place] - [place] -    Follow-up Note  Referring Provider: Jeannine Milroy., MD Primary Provider: Jeannine Milroy., MD Date of Office Visit: 02/22/2024  Subjective:   Emily Maxwell (DOB: June 11, 1941) is a 83 y.o. female who [facility]24 in re-evaluation of the following:  HPI: Emily Maxwell presents to this clinic in evaluation of asthma, allergic rhinitis.  I last saw her On 13 September 2023.  She was really doing very well with her airway while intermittently using montelukast  and antihistamines and it does not sound as though she required a systemic steroid or antibiotic for any type of airway issue.  Approximately 5 days ago she had acute onset of nasal drip and nasal congestion and head fullness and ear fullness and sneezing.  4 days ago she had a lot of disturbed sleep secondary to this nasal congestion.  As the week has progressed she has lots of postnasal drip and a raw throat and now she is coughing.  She has not had any associated fever or chills or aches or other systemic or constitutional symptoms.  She believes that this was secondary to exposure to pollens as she was cleaning up her outdoor garage at the time that this occurred.  Allergies as of 02/22/2024       Reactions   Ketorolac Tromethamine Palpitations   Avelox [moxifloxacin Hcl In Nacl] Other (See Comments)   Unable to describe reaction   Epinephrine  Other (See Comments)   Makes heart rate increase and cold sweats   Other Other (See Comments)   Sulfamethoxazole-trimethoprim Other (See Comments)   Tramadol Other (See Comments)   Unable to describe reaction   Penicillins Rash   Childhood rxn from weekly PCN shots w/ asthma Tx Has patient had a PCN reaction causing immediate rash, facial/tongue/throat swelling, SOB or lightheadedness with hypotension: unknown Has patient had a PCN reaction causing severe rash involving mucus  membranes or skin necrosis: unknown Has patient had a PCN reaction that required hospitalization: no   Has patient had a PCN reaction occurring within the last 10 years: no If all of the above answers are "NO", then may proceed with Cephalosporin   Sulfa Antibiotics Rash   Tolerates non-antibiotic sulfa drugs        Medication List    acetaminophen  500 MG tablet Commonly known as: TYLENOL  Take 500-1,000 mg by mouth every 6 (six) hours as needed (pain.).   albuterol  108 (90 Base) MCG/ACT inhaler Commonly known as: VENTOLIN  HFA Inhale 2 puffs into the lungs every 4 (four) hours as needed for wheezing or shortness of breath.   amLODipine  5 MG tablet Commonly known as: NORVASC  Take 1 tablet (5 mg total) by mouth daily.   b complex vitamins capsule Take 1 capsule by mouth daily.   BERBERINE HCI PO Take by mouth daily.   Cholecalciferol  125 MCG (5000 UT) capsule Take 5,000 Units by mouth daily.   doxycycline  100 MG capsule Commonly known as: VIBRAMYCIN  Take 100 mg by mouth daily.   FOLIC ACID  PO Take by mouth daily.   gabapentin  100 MG capsule Commonly known as: NEURONTIN  Take 100 mg by mouth daily as needed (restless leg syndrome).   GINGER ROOT PO Take by mouth daily.   HYDROcodone -acetaminophen  10-325 MG tablet Commonly known as: NORCO Take 1 tablet by mouth every 6 (six) hours as needed.   ketoconazole 2 % cream Commonly known as: NIZORAL SMARTSIG:sparingly Topical Twice  Daily   levothyroxine  100 MCG tablet Commonly known as: SYNTHROID  Take 1 tablet by mouth daily.   liothyronine  5 MCG tablet Commonly known as: CYTOMEL  Take 5 mcg by mouth in the morning and at bedtime. (0700 & 1200)   loratadine  10 MG tablet Commonly known as: CLARITIN  Take 10 mg by mouth daily.   MAGNESIUM  PO Take by mouth daily.   metroNIDAZOLE  0.75 % cream Commonly known as: METROCREAM  Apply topically daily.   montelukast  10 MG tablet Commonly known as: SINGULAIR  Take 1  tablet (10 mg total) by mouth at bedtime.   MULTI VITAMIN DAILY PO Take 1 tablet by mouth daily.   NUTRITIONAL SUPPLEMENT PO Take by mouth daily. Bone Support Supplement   PROBIOTIC PO Take by mouth daily.   TART CHERRY PO Take by mouth daily.   Zetia  10 MG tablet Generic drug: ezetimibe  Take 10 mg by mouth in the morning.    Past Medical History:  Diagnosis Date   Aneurysm, ascending aorta (HCC) 2013   38mm   Anxiety    hx of  anxiety , had stress test several years ago per patient that was normal    Arthritis    thumbs , oa   Ascending aorta dilation (HCC) 08/07/2012   Asthma    Cancer (HCC)    cancer left kidney   Cancer of kidney (HCC)    Had right kidney removed   Complication of anesthesia    pulse rate slow in past, did ok with recent anesthesia   Deaf    left ear   Dysrhythmia    occasional arrythmia, no meds taken for   Hematuria    no blood seen recently   Hematuria    History of fatty infiltration of liver    History of hiatal hernia    Hyperlipidemia    Hyperplastic colon polyp    Hypertension    Hypothyroidism    IFG (impaired fasting glucose)    Nodule of left lung    stable, surveillance  Dr Bernetta Brilliant   Numbness    left thigh, when walking or standing for long periods of time   Osteopenia    RLS (restless legs syndrome)    Rosacea    Simple endometrial hyperplasia years ago   Thoracic aortic aneurysm (HCC)    stable per last chest ct 07-11-14 [facility] on chart-    Vasovagal reaction    hx of, triggers after eating once you have fasted    Past Surgical History:  Procedure Laterality Date   acoustic neuroma S/P resection Lt ear deafness 1990     tinnitus  from left ear   BTL     BUNIONECTOMY Bilateral    x 2   CATARACT EXTRACTION Bilateral 2004   CRANIECTOMY FOR EXCISION OF ACOUSTIC NEUROMA Left 1990   Deaf on Left side   CYSTOSCOPY WITH RETROGRADE PYELOGRAM, URETEROSCOPY AND STENT PLACEMENT Bilateral 08/07/2015   Procedure:  CYSTOSCOPY WITH BILATERAL RETROGRADE PYELOGRAM, LEFT URETEROSCOPY WITH BIOPSY AND  LEFT STENT PLACEMENT;  Surgeon: Osborn Blaze, MD;  Location: [facility];  Service: Urology;  Laterality: Bilateral;   EXCISION MORTON'S NEUROMA  2010   left foot 3rd and 4th toe   EYE SURGERY Bilateral 2013   eyelid drooping fixed   EYE SURGERY Bilateral    yag procedure after cataracts   left hand middle finger surgery   2015   cadaver bone placed    left shoulder surgery 2005     Lt knee  surgery 2008 Left 2008   tears twice one surgery   REVERSE SHOULDER ARTHROPLASTY Left 02/18/2022   Procedure: REVERSE SHOULDER ARTHROPLASTY;  Surgeon: Winston Hawking, MD;  Location: [facility];  Service: Orthopedics;  Laterality: Left;  with ISB   ROBOT ASSITED LAPAROSCOPIC NEPHROURETERECTOMY Left 09/19/2015   Procedure: ROBOT ASSITED LAPAROSCOPIC NEPHROURETERECTOMY WITH DIAPHRAMATIC HERNIA REPAIR;  Surgeon: Osborn Blaze, MD;  Location: [facility];  Service: Urology;  Laterality: Left;   SP CHOLECYSTOMY     THYROIDECTOMY  11/2009   S/P Adenoma follicular vs hyperplastic   TONSILLECTOMY  age 70   TUBAL LIGATION  1976   WRIST SURGERY Left 2007   wrist cyst removed    Review of systems negative except as noted in HPI / PMHx or noted below:  Review of Systems  Constitutional: Negative.   HENT: Negative.    Eyes: Negative.   Respiratory: Negative.    Cardiovascular: Negative.   Gastrointestinal: Negative.   Genitourinary: Negative.   Musculoskeletal: Negative.   Skin: Negative.   Neurological: Negative.   Endo/Heme/Allergies: Negative.   Psychiatric/Behavioral: Negative.       Objective:   Vitals:   02/22/24 1627  BP: 134/74  Pulse: 72  Resp: 14  SpO2: 95%   Height: 5\' 3"  (160 cm)  Weight: 177 lb 3.2 oz (80.4 kg)   Physical Exam Constitutional:      Appearance: She is not diaphoretic.  HENT:     Head: Normocephalic.     Right Ear: Tympanic membrane, ear canal and external ear normal.     Left Ear: Tympanic  membrane, ear canal and external ear normal.     Nose: Nose normal. No mucosal edema or rhinorrhea.     Mouth/Throat:     Pharynx: Uvula midline. No oropharyngeal exudate.  Eyes:     Conjunctiva/sclera: Conjunctivae normal.  Neck:     Thyroid : No thyromegaly.     Trachea: Trachea normal. No tracheal tenderness or tracheal deviation.  Cardiovascular:     Rate and Rhythm: Normal rate and regular rhythm.     Heart sounds: Normal heart sounds, S1 normal and S2 normal. No murmur heard. Pulmonary:     Effort: No respiratory distress.     Breath sounds: Normal breath sounds. No stridor. No wheezing or rales.  Lymphadenopathy:     Head:     Right side of head: No tonsillar adenopathy.     Left side of head: No tonsillar adenopathy.     Cervical: No cervical adenopathy.  Skin:    Findings: No erythema or rash.     Nails: There is no clubbing.  Neurological:     Mental Status: She is alert.     Diagnostics: Spirometry was performed and demonstrated an FEV1 of 1.98 at 109 % of predicted.   Assessment and Plan:   1. Viral upper respiratory tract infection   2. Other allergic rhinitis   3. History of asthma    1. Continue the following if needed:  A. loratadine  10 mg daily B. montelukast  10 mg daily  C. albuterol  - 2 inhalations every 6 hours  2.  For this recent event:   A. Depomedrol 80 IM delivered in clinic today  B. Restart Montelukast  10 mg + Loratadine  10 - daily  C. Mucinex  DM - 2 times per day  D. Nasal saline a few times per day  E. Further treatment???  3. Return to clinic in 1 year or earlier if problem  Josilynn appears to have an  issue with respiratory tract inflammation and I suspect that this is secondary to her viral respiratory tract infection although certainly could be secondary to extensive pollen exposure although she has really done well this entire spring with pollen exposure and were really at a point in time in which pollen counts have decreased yet  she develops this issue.  Will treat her with anti-inflammatory medications as noted above.  She will keep in contact with me noting her response to this approach.  Further evaluation treatment based on response.  Schuyler Custard, MD Allergy  / Immunology [facility]

## 2024-02-23 ENCOUNTER — Encounter: Payer: Self-pay | Admitting: Allergy and Immunology

## 2024-02-29 ENCOUNTER — Telehealth: Payer: Self-pay | Admitting: Allergy and Immunology

## 2024-02-29 MED ORDER — AZITHROMYCIN 500 MG PO TABS: ORAL_TABLET | ORAL | 0 refills | Status: DC

## 2024-02-29 NOTE — Telephone Encounter (Signed)
 Called and informed patient of Dr. Zenia Hight message.  Sent ERX to [facility].  I asked her to keep us  updated if medication does not help with symptoms.

## 2024-02-29 NOTE — Telephone Encounter (Signed)
 Patient states she got no relief from the Depo shot last week. She still has a lingering cough and a lot of nasal congestion.

## 2024-03-08 DIAGNOSIS — M546 Pain in thoracic spine: Secondary | ICD-10-CM | POA: Diagnosis not present

## 2024-03-08 DIAGNOSIS — Z79899 Other long term (current) drug therapy: Secondary | ICD-10-CM | POA: Diagnosis not present

## 2024-03-08 DIAGNOSIS — M5416 Radiculopathy, lumbar region: Secondary | ICD-10-CM | POA: Diagnosis not present

## 2024-04-27 ENCOUNTER — Encounter (HOSPITAL_BASED_OUTPATIENT_CLINIC_OR_DEPARTMENT_OTHER): Payer: Self-pay | Admitting: Emergency Medicine

## 2024-04-27 ENCOUNTER — Emergency Department (HOSPITAL_BASED_OUTPATIENT_CLINIC_OR_DEPARTMENT_OTHER)
Admission: EM | Admit: 2024-04-27 | Discharge: 2024-04-27 | Disposition: A | Discharge status: Home / Self Care | Attending: Emergency Medicine | Admitting: Emergency Medicine

## 2024-04-27 ENCOUNTER — Other Ambulatory Visit: Payer: Self-pay

## 2024-04-27 ENCOUNTER — Emergency Department (HOSPITAL_BASED_OUTPATIENT_CLINIC_OR_DEPARTMENT_OTHER)

## 2024-04-27 ENCOUNTER — Emergency Department (HOSPITAL_BASED_OUTPATIENT_CLINIC_OR_DEPARTMENT_OTHER): Admitting: Radiology

## 2024-04-27 DIAGNOSIS — M25531 Pain in right wrist: Secondary | ICD-10-CM | POA: Diagnosis not present

## 2024-04-27 DIAGNOSIS — S199XXA Unspecified injury of neck, initial encounter: Secondary | ICD-10-CM | POA: Diagnosis not present

## 2024-04-27 DIAGNOSIS — R519 Headache, unspecified: Secondary | ICD-10-CM | POA: Diagnosis not present

## 2024-04-27 DIAGNOSIS — M1712 Unilateral primary osteoarthritis, left knee: Secondary | ICD-10-CM | POA: Diagnosis not present

## 2024-04-27 DIAGNOSIS — M25562 Pain in left knee: Secondary | ICD-10-CM | POA: Diagnosis not present

## 2024-04-27 DIAGNOSIS — S0993XA Unspecified injury of face, initial encounter: Secondary | ICD-10-CM | POA: Diagnosis not present

## 2024-04-27 DIAGNOSIS — W01198A Fall on same level from slipping, tripping and stumbling with subsequent striking against other object, initial encounter: Secondary | ICD-10-CM | POA: Diagnosis not present

## 2024-04-27 DIAGNOSIS — S00511A Abrasion of lip, initial encounter: Secondary | ICD-10-CM | POA: Insufficient documentation

## 2024-04-27 DIAGNOSIS — M47813 Spondylosis without myelopathy or radiculopathy, cervicothoracic region: Secondary | ICD-10-CM | POA: Diagnosis not present

## 2024-04-27 DIAGNOSIS — S0081XA Abrasion of other part of head, initial encounter: Secondary | ICD-10-CM | POA: Diagnosis not present

## 2024-04-27 DIAGNOSIS — S0990XA Unspecified injury of head, initial encounter: Secondary | ICD-10-CM | POA: Diagnosis not present

## 2024-04-27 DIAGNOSIS — S6991XA Unspecified injury of right wrist, hand and finger(s), initial encounter: Secondary | ICD-10-CM | POA: Diagnosis present

## 2024-04-27 DIAGNOSIS — S8002XA Contusion of left knee, initial encounter: Secondary | ICD-10-CM | POA: Diagnosis not present

## 2024-04-27 DIAGNOSIS — M1811 Unilateral primary osteoarthritis of first carpometacarpal joint, right hand: Secondary | ICD-10-CM | POA: Diagnosis not present

## 2024-04-27 DIAGNOSIS — W19XXXA Unspecified fall, initial encounter: Secondary | ICD-10-CM

## 2024-04-27 DIAGNOSIS — S63501A Unspecified sprain of right wrist, initial encounter: Secondary | ICD-10-CM | POA: Diagnosis not present

## 2024-04-27 NOTE — ED Triage Notes (Addendum)
 Mechanical fall Tripped over curb Happened around 10:30 Am Hit face, lip bleeding Pain in right wrist and left knee. Denis LOC GCS 15 Took tylenol  PTA

## 2024-04-27 NOTE — ED Notes (Signed)
 Fall risk bundle is currently in place.

## 2024-04-27 NOTE — ED Provider Notes (Signed)
 [place] EMERGENCY DEPARTMENT AT [facility] Provider Note   CSN: [id number] Arrival date & time: 04/27/24  1211     Patient presents with: [place]   Emily Maxwell is a 83 y.o. female.   Patient presents to the emergency department for evaluation of injuries sustained after a fall.  Fall occurred around 10:30 AM.  Patient tripped on a tall curb.  She fell forward and landed on her face and hit her head.  She sustained abrasions to her lips.  Currently she has pain in her left knee and right hand and wrist.  She is not on anticoagulation.  She has had a mild headache and mild neck stiffness.  No confusion or vomiting.  No vision change.  Patient took Tylenol  prior to arrival, headache and pains are improving.  Denies any infectious symptoms.       Prior to Admission medications   Medication Sig Start Date End Date Taking? Authorizing Provider  acetaminophen  (TYLENOL ) 500 MG tablet Take 500-1,000 mg by mouth every 6 (six) hours as needed (pain.).    [provider]  albuterol  (VENTOLIN  HFA) 108 (90 Base) MCG/ACT inhaler Inhale 2 puffs into the lungs every 4 (four) hours as needed for wheezing or shortness of breath. 08/26/22   Leotis Bogus, MD  amLODipine  (NORVASC ) 5 MG tablet Take 1 tablet (5 mg total) by mouth daily. 08/27/22   Leotis Bogus, MD  azithromycin  (ZITHROMAX ) 500 MG tablet Take one tablet by mouth once daily for three days. 02/29/24   Kozlow, Camellia PARAS, MD  b complex vitamins capsule Take 1 capsule by mouth daily. 09/17/09   [provider]  Berberine Chloride (BERBERINE HCI PO) Take by mouth daily.    [provider]  Cholecalciferol  125 MCG (5000 UT) capsule Take 5,000 Units by mouth daily.    [provider]  doxycycline  (VIBRAMYCIN ) 100 MG capsule Take 100 mg by mouth daily.    [provider]  FOLIC ACID  PO Take by mouth daily.    [provider]  gabapentin  (NEURONTIN ) 100 MG capsule Take 100 mg by mouth daily  as needed (restless leg syndrome). 09/28/21   [provider]  Ginger, Zingiber officinalis, (GINGER ROOT PO) Take by mouth daily.    [provider]  HYDROcodone -acetaminophen  (NORCO) 10-325 MG tablet Take 1 tablet by mouth every 6 (six) hours as needed.    [provider]  ketoconazole (NIZORAL) 2 % cream SMARTSIG:sparingly Topical Twice Daily 01/23/24   [provider]  levothyroxine  (SYNTHROID ) 100 MCG tablet Take 1 tablet by mouth daily.    [provider]  liothyronine  (CYTOMEL ) 5 MCG tablet Take 5 mcg by mouth in the morning and at bedtime. (0700 & 1200)    [provider]  loratadine  (CLARITIN ) 10 MG tablet Take 10 mg by mouth daily. 06/16/17   [provider]  MAGNESIUM  PO Take by mouth daily.    [provider]  metroNIDAZOLE  (METROCREAM ) 0.75 % cream Apply topically daily. 10/18/23   [provider]  montelukast  (SINGULAIR ) 10 MG tablet Take 1 tablet (10 mg total) by mouth at bedtime. 09/13/23   Kozlow, Eric J, MD  Multiple Vitamin (MULTI VITAMIN DAILY PO) Take 1 tablet by mouth daily. 09/17/09   [provider]  Nutritional Supplements (NUTRITIONAL SUPPLEMENT PO) Take by mouth daily. Bone Support Supplement    [provider]  Probiotic Product (PROBIOTIC PO) Take by mouth daily.    [provider]  TART CHERRY PO Take  by mouth daily.    [provider]  ZETIA  10 MG tablet Take 10 mg by mouth in the morning. 07/06/13   [provider]    Allergies: Ketorolac tromethamine, Avelox [moxifloxacin hcl in nacl], Epinephrine , Other, Sulfamethoxazole-trimethoprim, Tramadol, Penicillins, and Sulfa antibiotics    Review of Systems  Updated Vital Signs BP (!) 138/97   Pulse 73   Temp 98.2 F (36.8 C) (Oral)   Resp 20   LMP 07/11/1983   SpO2 98%   Physical Exam Vitals and nursing note reviewed.  Constitutional:      Appearance: She is well-developed.  HENT:      Head: Normocephalic. No raccoon eyes or Battle's sign.     Right Ear: Tympanic membrane, ear canal and external ear normal. No hemotympanum.     Left Ear: Tympanic membrane, ear canal and external ear normal. No hemotympanum.     Ears:     Comments: Hearing aids in place    Nose: Nose normal. No congestion or rhinorrhea.     Mouth/Throat:     Mouth: Mucous membranes are moist.     Pharynx: Uvula midline.     Comments: Abrasion to lips, no deep laceration.  Dentition appears intact. Eyes:     General: Lids are normal.     Extraocular Movements:     Right eye: No nystagmus.     Left eye: No nystagmus.     Conjunctiva/sclera: Conjunctivae normal.     Pupils: Pupils are equal, round, and reactive to light.     Comments: No visible hyphema noted  Cardiovascular:     Rate and Rhythm: Normal rate and regular rhythm.  Pulmonary:     Effort: Pulmonary effort is normal.     Breath sounds: Normal breath sounds.  Abdominal:     Palpations: Abdomen is soft.     Tenderness: There is no abdominal tenderness.  Musculoskeletal:     Right elbow: Normal range of motion. No tenderness.     Left elbow: Normal range of motion. No tenderness.     Right wrist: Tenderness present. No bony tenderness. Normal range of motion.     Left wrist: No tenderness or bony tenderness. Normal range of motion.     Right hand: Tenderness present. No bony tenderness. Normal range of motion.     Left hand: No tenderness or bony tenderness. Normal range of motion.     Cervical back: Normal range of motion and neck supple. No tenderness or bony tenderness.     Thoracic back: No tenderness or bony tenderness.     Lumbar back: No tenderness or bony tenderness.     Right knee: No bony tenderness. Normal range of motion. No tenderness.     Left knee: No bony tenderness. Normal range of motion. Tenderness present.  Skin:    General: Skin is warm and dry.  Neurological:     Mental Status: She is alert and oriented to person,  place, and time.     GCS: GCS eye subscore is 4. GCS verbal subscore is 5. GCS motor subscore is 6.     Cranial Nerves: No cranial nerve deficit.     Sensory: No sensory deficit.     Coordination: Coordination normal.     (all labs ordered are listed, but only abnormal results are displayed) Labs Reviewed - No data to display  EKG: None  Radiology: No results found.   Procedures   Medications Ordered in the ED - No data to  display  ED Course  Patient seen and examined. History obtained directly from patient.   Labs/EKG: None ordered  Imaging: Ordered CT head, cervical spine, maxillofacial; x-ray of the right hand and wrist, left knee  Medications/Fluids: None ordered  Most recent vital signs reviewed and are as follows: BP (!) 138/97   Pulse 73   Temp 98.2 F (36.8 C) (Oral)   Resp 20   LMP 07/11/1983   SpO2 98%   Initial impression: Mechanical fall, facial injury, minor head injury, extremity injuries  2:31 PM Reassessment performed. Patient appears stable, comfortable.  No decompensation.  Patient discussed with and seen by Dr. Dean.  Imaging personally visualized and interpreted including: X-ray of the knee and wrist agree negative  Reviewed pertinent lab work and imaging with patient at bedside. Questions answered.   Most current vital signs reviewed and are as follows: BP (!) 138/97   Pulse 73   Temp 98.2 F (36.8 C) (Oral)   Resp 20   LMP 07/11/1983   SpO2 98%   Plan: Discharge to home.   Prescriptions written for: None  Other home care instructions discussed: Discussed wound care, need to return with worsening pain, redness, swelling or other signs of infection.  Patient was counseled on head injury precautions and symptoms that should indicate their return to the ED.  These include severe worsening headache, vision changes, confusion, loss of consciousness, trouble walking, nausea & vomiting, or weakness/tingling in extremities.     Follow-up instructions discussed: Patient encouraged to follow-up with their PCP in 7 days with any persistent symptoms.                                  Medical Decision Making Amount and/or Complexity of Data Reviewed Radiology: ordered.   Patient with mechanical fall today, minor head injury, abrasions of her lips.  Head and neck imaging were negative.  She also injured her right wrist and left knee.  X-rays of these areas were negative.  Distal circulation, motor, sensation intact in all extremities.  Will treat symptomatically with routine care.     Final diagnoses:  Fall, initial encounter  Abrasion of face, initial encounter  Sprain of right wrist, initial encounter  Contusion of left knee, initial encounter    ED Discharge Orders     None          Desiderio Chew, PA-C 04/27/24 1433    Dean Clarity, MD 04/27/24 [phone]

## 2024-04-27 NOTE — Discharge Instructions (Signed)
 Please read and follow all provided instructions.  Your diagnoses today include:  1. Fall, initial encounter   2. Abrasion of face, initial encounter   3. Sprain of right wrist, initial encounter   4. Contusion of left knee, initial encounter     Tests performed today include: CT scan of your head, facial bones, and cervical spine that did not show any serious injury. X-ray of your right wrist showed arthritis but no broken bones X-ray of the left knee did not show any broken bones Vital signs. See below for your results today.   Medications prescribed:  None  Take any prescribed medications only as directed.  Home care instructions:  Follow any educational materials contained in this packet.  BE VERY CAREFUL not to take multiple medicines containing Tylenol  (also called acetaminophen ). Doing so can lead to an overdose which can damage your liver and cause liver failure and possibly death.   Follow-up instructions: Please follow-up with your primary care provider in the next 7 days for further evaluation of your symptoms if not improving.   Return instructions:  SEEK IMMEDIATE MEDICAL ATTENTION IF: There is confusion or drowsiness (although children frequently become drowsy after injury).  You cannot awaken the injured person.  You have more than one episode of vomiting.  You notice dizziness or unsteadiness which is getting worse, or inability to walk.  You have convulsions or unconsciousness.  You experience severe, persistent headaches not relieved by Tylenol . You cannot use arms or legs normally.  There are changes in pupil sizes. (This is the black center in the colored part of the eye)  There is clear or bloody discharge from the nose or ears.  You have change in speech, vision, swallowing, or understanding.  Localized weakness, numbness, tingling, or change in bowel or bladder control. You have any other emergent concerns.  Additional Information: You have had a  head injury which does not appear to require admission at this time.  Your vital signs today were: BP (!) 138/97   Pulse 73   Temp 98.2 F (36.8 C) (Oral)   Resp 20   LMP 07/11/1983   SpO2 98%  If your blood pressure (BP) was elevated above 135/85 this visit, please have this repeated by your doctor within one month. --------------

## 2024-05-15 ENCOUNTER — Ambulatory Visit: Admitting: Allergy and Immunology

## 2024-05-22 ENCOUNTER — Ambulatory Visit (INDEPENDENT_AMBULATORY_CARE_PROVIDER_SITE_OTHER): Admitting: Allergy and Immunology

## 2024-05-22 VITALS — BP 136/84 | HR 83 | Temp 98.7°F | Resp 14 | Ht 62.89 in | Wt 180.4 lb

## 2024-05-22 DIAGNOSIS — J3089 Other allergic rhinitis: Secondary | ICD-10-CM

## 2024-05-22 DIAGNOSIS — Z8709 Personal history of other diseases of the respiratory system: Secondary | ICD-10-CM | POA: Diagnosis not present

## 2024-05-22 DIAGNOSIS — T781XXD Other adverse food reactions, not elsewhere classified, subsequent encounter: Secondary | ICD-10-CM

## 2024-05-22 MED ORDER — ALBUTEROL SULFATE HFA 108 (90 BASE) MCG/ACT IN AERS: 2.0000 | INHALATION_SPRAY | RESPIRATORY_TRACT | 1 refills | Status: DC | PRN

## 2024-05-22 MED ORDER — MONTELUKAST SODIUM 10 MG PO TABS: 10.0000 mg | ORAL_TABLET | Freq: Every day | ORAL | 11 refills | Status: DC

## 2024-05-22 MED ORDER — LORATADINE 10 MG PO TABS: 10.0000 mg | ORAL_TABLET | Freq: Every day | ORAL | 3 refills | Status: DC | PRN

## 2024-05-22 NOTE — Patient Instructions (Addendum)
  1. Continue the following if needed:  A. loratadine  10 mg daily B. montelukast  10 mg daily  C. albuterol  - 2 inhalations every 6 hours  2.  Blood - IgE to tomato, potato, shellfish, lemon/lime, nut panel w/r  3. Return to clinic in 1 year or earlier if problem

## 2024-05-22 NOTE — Progress Notes (Unsigned)
 [place] - [place] - [place] - [place]   Follow-up Note  Referring Provider: Loreli Elsie JONETTA Mickey., MD Primary Provider: Loreli Elsie JONETTA Mickey., MD Date of Office Visit: 05/22/2024  Subjective:   Emily Maxwell (DOB: 1941/08/15) is a 83 y.o. female who [facility] in re-evaluation of the following:  HPI: Emily Maxwell returns to this clinic in evaluation of asthma and allergic rhinitis.  I last saw Emily Maxwell in this clinic 22 Feb 2024.  Emily Maxwell is really done very well with Emily Maxwell airway and has not required a systemic steroid or an antibiotic since Emily Maxwell has been seen in this clinic last and Emily Maxwell rarely uses the short acting bronchodilator and Emily Maxwell can exert herself without any problem.  Emily Maxwell obtains this degree of control while consistently using the H1 receptor blocker and a leukotriene modifier.  Emily Maxwell is very worried about food hypersensitivity.  Emily Maxwell had another IgG test performed at [facility] which identified multiple foods with elevated IgG's.  Emily Maxwell has always avoided corn and potato which appears to be a trigger for diarrhea.  Emily Maxwell gets abdominal pain if Emily Maxwell eats crab.  Emily Maxwell gets coughing if Emily Maxwell eats tomato.  Emily Maxwell gets a facial rash with lemons.  Emily Maxwell is very worried about having a nut allergy  for Emily Maxwell did have IgG antibodies against nuts.  And limes.  Allergies as of 05/22/2024       Reactions   Ketorolac Tromethamine Palpitations   Avelox [moxifloxacin Hcl In Nacl] Other (See Comments)   Unable to describe reaction   Epinephrine  Other (See Comments)   Makes heart rate increase and cold sweats   Other Other (See Comments)   Sulfamethoxazole-trimethoprim Other (See Comments)   Tramadol Other (See Comments)   Unable to describe reaction   Penicillins Rash   Childhood rxn from weekly PCN shots w/ asthma Tx Has patient had a PCN reaction causing immediate rash, facial/tongue/throat swelling, SOB or lightheadedness with hypotension:  unknown Has patient had a PCN reaction causing severe rash involving mucus membranes or skin necrosis: unknown Has patient had a PCN reaction that required hospitalization: no   Has patient had a PCN reaction occurring within the last 10 years: no If all of the above answers are NO, then may proceed with Cephalosporin   Sulfa Antibiotics Rash   Tolerates non-antibiotic sulfa drugs        Medication List    acetaminophen  500 MG tablet Commonly known as: TYLENOL  Take 500-1,000 mg by mouth every 6 (six) hours as needed (pain.).   albuterol  108 (90 Base) MCG/ACT inhaler Commonly known as: VENTOLIN  HFA Inhale 2 puffs into the lungs every 4 (four) hours as needed for wheezing or shortness of breath.   amLODipine  5 MG tablet Commonly known as: NORVASC  Take 1 tablet (5 mg total) by mouth daily.   b complex vitamins capsule Take 1 capsule by mouth daily.   BERBERINE HCI PO Take by mouth daily.   Cholecalciferol  125 MCG (5000 UT) capsule Take 5,000 Units by mouth daily.   doxycycline  100 MG capsule Commonly known as: VIBRAMYCIN  Take 100 mg by mouth daily.   FOLIC ACID  PO Take by mouth daily.   gabapentin  100 MG capsule Commonly known as: NEURONTIN  Take 100 mg by mouth daily as needed (restless leg syndrome).   GINGER ROOT PO Take by mouth daily.   HYDROcodone -acetaminophen  10-325 MG tablet Commonly known as: NORCO Take 1 tablet by mouth every 6 (six) hours  as needed.   ketoconazole 2 % cream Commonly known as: NIZORAL SMARTSIG:sparingly Topical Twice Daily   levothyroxine  100 MCG tablet Commonly known as: SYNTHROID  Take 1 tablet by mouth daily.   liothyronine  5 MCG tablet Commonly known as: CYTOMEL  Take 5 mcg by mouth in the morning and at bedtime. (0700 & 1200)   loratadine  10 MG tablet Commonly known as: CLARITIN  Take 10 mg by mouth daily.   MAGNESIUM  PO Take by mouth daily.   metroNIDAZOLE  0.75 % cream Commonly known as: METROCREAM  Apply topically  daily.   montelukast  10 MG tablet Commonly known as: SINGULAIR  Take 1 tablet (10 mg total) by mouth at bedtime.   MULTI VITAMIN DAILY PO Take 1 tablet by mouth daily.   NUTRITIONAL SUPPLEMENT PO Take by mouth daily. Bone Support Supplement   PROBIOTIC PO Take by mouth daily.   TART CHERRY PO Take by mouth daily.   Zetia  10 MG tablet Generic drug: ezetimibe  Take 10 mg by mouth in the morning.    Past Medical History:  Diagnosis Date   Aneurysm, ascending aorta (HCC) 2013   38mm   Anxiety    hx of  anxiety , had stress test several years ago per patient that was normal    Arthritis    thumbs , oa   Ascending aorta dilation (HCC) 08/07/2012   Asthma    Cancer (HCC)    cancer left kidney   Cancer of kidney (HCC)    Had right kidney removed   Complication of anesthesia    pulse rate slow in past, did ok with recent anesthesia   Deaf    left ear   Dysrhythmia    occasional arrythmia, no meds taken for   Hematuria    no blood seen recently   Hematuria    History of fatty infiltration of liver    History of hiatal hernia    Hyperlipidemia    Hyperplastic colon polyp    Hypertension    Hypothyroidism    IFG (impaired fasting glucose)    Nodule of left lung    stable, surveillance  Dr Loreli   Numbness    left thigh, when walking or standing for long periods of time   Osteopenia    RLS (restless legs syndrome)    Rosacea    Simple endometrial hyperplasia years ago   Thoracic aortic aneurysm (HCC)    stable per last chest ct 07-11-14 [facility] on chart-    Vasovagal reaction    hx of, triggers after eating once you have fasted    Past Surgical History:  Procedure Laterality Date   acoustic neuroma S/P resection Lt ear deafness 1990     tinnitus  from left ear   BTL     BUNIONECTOMY Bilateral    x 2   CATARACT EXTRACTION Bilateral 2004   CRANIECTOMY FOR EXCISION OF ACOUSTIC NEUROMA Left 1990   Deaf on Left side   CYSTOSCOPY WITH RETROGRADE PYELOGRAM,  URETEROSCOPY AND STENT PLACEMENT Bilateral 08/07/2015   Procedure: CYSTOSCOPY WITH BILATERAL RETROGRADE PYELOGRAM, LEFT URETEROSCOPY WITH BIOPSY AND  LEFT STENT PLACEMENT;  Surgeon: Ricardo Likens, MD;  Location: [facility];  Service: Urology;  Laterality: Bilateral;   EXCISION MORTON'S NEUROMA  2010   left foot 3rd and 4th toe   EYE SURGERY Bilateral 2013   eyelid drooping fixed   EYE SURGERY Bilateral    yag procedure after cataracts   left hand middle finger surgery   2015   cadaver  bone placed    left shoulder surgery 2005     Lt knee surgery 2008 Left 2008   tears twice one surgery   REVERSE SHOULDER ARTHROPLASTY Left 02/18/2022   Procedure: REVERSE SHOULDER ARTHROPLASTY;  Surgeon: Kay Kemps, MD;  Location: [facility];  Service: Orthopedics;  Laterality: Left;  with ISB   ROBOT ASSITED LAPAROSCOPIC NEPHROURETERECTOMY Left 09/19/2015   Procedure: ROBOT ASSITED LAPAROSCOPIC NEPHROURETERECTOMY WITH DIAPHRAMATIC HERNIA REPAIR;  Surgeon: Ricardo Likens, MD;  Location: [facility];  Service: Urology;  Laterality: Left;   SP CHOLECYSTOMY     THYROIDECTOMY  11/2009   S/P Adenoma follicular vs hyperplastic   TONSILLECTOMY  age 35   TUBAL LIGATION  1976   WRIST SURGERY Left 2007   wrist cyst removed    Review of systems negative except as noted in HPI / PMHx or noted below:  Review of Systems  Constitutional: Negative.   HENT: Negative.    Eyes: Negative.   Respiratory: Negative.    Cardiovascular: Negative.   Gastrointestinal: Negative.   Genitourinary: Negative.   Musculoskeletal: Negative.   Skin: Negative.   Neurological: Negative.   Endo/Heme/Allergies: Negative.   Psychiatric/Behavioral: Negative.       Objective:   Vitals:   05/22/24 1552  BP: 136/84  Pulse: 83  Resp: 14  Temp: 98.7 F (37.1 C)  SpO2: 96%   Height: 5' 2.89 (159.7 cm)  Weight: 180 lb 6.4 oz (81.8 kg)   Physical Exam Constitutional:      Appearance: Emily Maxwell is not diaphoretic.  HENT:     Head:  Normocephalic.     Right Ear: Tympanic membrane, ear canal and external ear normal.     Left Ear: Tympanic membrane, ear canal and external ear normal.     Nose: Nose normal. No mucosal edema or rhinorrhea.     Mouth/Throat:     Pharynx: Uvula midline. No oropharyngeal exudate.  Eyes:     Conjunctiva/sclera: Conjunctivae normal.  Neck:     Thyroid : No thyromegaly.     Trachea: Trachea normal. No tracheal tenderness or tracheal deviation.  Cardiovascular:     Rate and Rhythm: Normal rate and regular rhythm.     Heart sounds: Normal heart sounds, S1 normal and S2 normal. No murmur heard. Pulmonary:     Effort: No respiratory distress.     Breath sounds: Normal breath sounds. No stridor. No wheezing or rales.  Lymphadenopathy:     Head:     Right side of head: No tonsillar adenopathy.     Left side of head: No tonsillar adenopathy.     Cervical: No cervical adenopathy.  Skin:    Findings: No erythema or rash.     Nails: There is no clubbing.  Neurological:     Mental Status: Emily Maxwell is alert.     Diagnostics: none  Assessment and Plan:   1. Other allergic rhinitis   2. History of asthma   3. Adverse food reaction, subsequent encounter    1. Continue the following if needed:  A. loratadine  10 mg daily B. montelukast  10 mg daily  C. albuterol  - 2 inhalations every 6 hours  2.  Blood - IgE to tomato, potato, shellfish, lemon/lime, nut panel w/r  3. Return to clinic in 1 year or earlier if problem  Kendyl appears to be doing quite well with Emily Maxwell airway issue on Emily Maxwell current plan of a leukotriene modifier and an H1 receptor blocker.  Emily Maxwell is worried to death that Emily Maxwell has a  significant food hypersensitivity based upon Emily Maxwell testing with a IgG antibodies.  We will see if Emily Maxwell has any IgE antibodies against this foods that concern Emily Maxwell the most and we will make a decision about how to proceed pending those results.   Camellia Denis, MD Allergy  / Immunology [facility]

## 2024-05-23 ENCOUNTER — Encounter: Payer: Self-pay | Admitting: Allergy and Immunology

## 2024-05-25 LAB — ALLERGEN, LEMON, F208: Lemon: <0.1 kU/L

## 2024-05-25 LAB — IGE NUT PROF. W/COMPONENT RFLX
F017-IgE Hazelnut (Filbert): <0.1 kU/L
F018-IgE Brazil Nut: <0.1 kU/L
F020-IgE Almond: <0.1 kU/L
F202-IgE Cashew Nut: <0.1 kU/L
F203-IgE Pistachio Nut: <0.1 kU/L
F256-IgE Walnut: <0.1 kU/L
Macadamia Nut, IgE: <0.1 kU/L
Peanut, IgE: <0.1 kU/L
Pecan Nut IgE: <0.1 kU/L

## 2024-05-25 LAB — ALLERGEN PROFILE, SHELLFISH
Clam IgE: <0.1 kU/L
F023-IgE Crab: <0.1 kU/L
F080-IgE Lobster: <0.1 kU/L
F290-IgE Oyster: <0.1 kU/L
Scallop IgE: <0.1 kU/L
Shrimp IgE: <0.1 kU/L

## 2024-05-25 LAB — ALLERGEN, TOMATO F25: Allergen Tomato, IgE: <0.1 kU/L

## 2024-05-25 LAB — ALLERGEN, WHITE POTATO,F35: Allergen Potato, White IgE: <0.1 kU/L

## 2024-05-25 LAB — F306-IGE LIME: Allergen Lime IgE: <0.1 kU/L

## 2024-05-28 ENCOUNTER — Ambulatory Visit: Payer: Self-pay | Admitting: Allergy and Immunology

## 2024-06-12 ENCOUNTER — Ambulatory Visit: Admitting: Allergy and Immunology

## 2024-06-20 ENCOUNTER — Ambulatory Visit (HOSPITAL_BASED_OUTPATIENT_CLINIC_OR_DEPARTMENT_OTHER)
Admission: RE | Admit: 2024-06-20 | Discharge: 2024-06-20 | Disposition: A | Discharge status: Home / Self Care | Source: Ambulatory Visit | Attending: Medical | Admitting: Medical

## 2024-06-20 ENCOUNTER — Other Ambulatory Visit (HOSPITAL_COMMUNITY): Payer: Self-pay | Admitting: Medical

## 2024-06-20 DIAGNOSIS — M25551 Pain in right hip: Secondary | ICD-10-CM | POA: Insufficient documentation

## 2024-07-03 ENCOUNTER — Ambulatory Visit (INDEPENDENT_AMBULATORY_CARE_PROVIDER_SITE_OTHER): Payer: Medicare Other | Admitting: Otolaryngology

## 2024-07-19 DIAGNOSIS — S32591A Other specified fracture of right pubis, initial encounter for closed fracture: Secondary | ICD-10-CM | POA: Diagnosis not present

## 2024-07-19 DIAGNOSIS — S76301A Unspecified injury of muscle, fascia and tendon of the posterior muscle group at thigh level, right thigh, initial encounter: Secondary | ICD-10-CM | POA: Diagnosis not present

## 2024-07-21 DIAGNOSIS — Z23 Encounter for immunization: Secondary | ICD-10-CM | POA: Diagnosis not present

## 2024-08-14 DIAGNOSIS — Z23 Encounter for immunization: Secondary | ICD-10-CM | POA: Diagnosis not present

## 2024-08-15 DIAGNOSIS — M5416 Radiculopathy, lumbar region: Secondary | ICD-10-CM | POA: Diagnosis not present

## 2024-08-15 DIAGNOSIS — Z5181 Encounter for therapeutic drug level monitoring: Secondary | ICD-10-CM | POA: Diagnosis not present

## 2024-08-21 DIAGNOSIS — Z85828 Personal history of other malignant neoplasm of skin: Secondary | ICD-10-CM | POA: Diagnosis not present

## 2024-08-21 DIAGNOSIS — L2089 Other atopic dermatitis: Secondary | ICD-10-CM | POA: Diagnosis not present

## 2024-08-21 DIAGNOSIS — B353 Tinea pedis: Secondary | ICD-10-CM | POA: Diagnosis not present

## 2024-08-21 DIAGNOSIS — L720 Epidermal cyst: Secondary | ICD-10-CM | POA: Diagnosis not present

## 2024-08-21 DIAGNOSIS — L905 Scar conditions and fibrosis of skin: Secondary | ICD-10-CM | POA: Diagnosis not present

## 2024-08-30 DIAGNOSIS — M5416 Radiculopathy, lumbar region: Secondary | ICD-10-CM | POA: Diagnosis not present

## 2024-08-31 ENCOUNTER — Ambulatory Visit (HOSPITAL_COMMUNITY)
Admission: RE | Admit: 2024-08-31 | Discharge: 2024-08-31 | Disposition: A | Discharge status: Home / Self Care | Source: Ambulatory Visit | Attending: Urology | Admitting: Urology

## 2024-08-31 ENCOUNTER — Other Ambulatory Visit (HOSPITAL_COMMUNITY): Payer: Self-pay | Admitting: Urology

## 2024-08-31 DIAGNOSIS — C652 Malignant neoplasm of left renal pelvis: Secondary | ICD-10-CM | POA: Insufficient documentation

## 2024-08-31 DIAGNOSIS — Z96612 Presence of left artificial shoulder joint: Secondary | ICD-10-CM | POA: Diagnosis not present

## 2024-09-04 ENCOUNTER — Encounter: Payer: Self-pay | Admitting: Allergy and Immunology

## 2024-09-04 ENCOUNTER — Ambulatory Visit (INDEPENDENT_AMBULATORY_CARE_PROVIDER_SITE_OTHER): Payer: Medicare Other | Admitting: Allergy and Immunology

## 2024-09-04 VITALS — BP 116/78 | HR 83 | Temp 97.8°F | Wt 181.9 lb

## 2024-09-04 DIAGNOSIS — J4520 Mild intermittent asthma, uncomplicated: Secondary | ICD-10-CM

## 2024-09-04 DIAGNOSIS — K9049 Malabsorption due to intolerance, not elsewhere classified: Secondary | ICD-10-CM | POA: Diagnosis not present

## 2024-09-04 DIAGNOSIS — J3089 Other allergic rhinitis: Secondary | ICD-10-CM | POA: Diagnosis not present

## 2024-09-04 MED ORDER — MONTELUKAST SODIUM 10 MG PO TABS: 10.0000 mg | ORAL_TABLET | Freq: Every day | ORAL | 11 refills | Status: AC

## 2024-09-04 MED ORDER — ALBUTEROL SULFATE HFA 108 (90 BASE) MCG/ACT IN AERS: 2.0000 | INHALATION_SPRAY | RESPIRATORY_TRACT | 3 refills | Status: AC | PRN

## 2024-09-04 MED ORDER — LORATADINE 10 MG PO TABS
10.0000 mg | ORAL_TABLET | Freq: Every day | ORAL | 3 refills | Status: AC | PRN
Start: 2024-09-04 — End: ?

## 2024-09-04 NOTE — Patient Instructions (Addendum)
  1. Continue the following if needed:  A. loratadine  10 mg daily B. montelukast  10 mg daily  C. albuterol  - 2 inhalations every 6 hours  2. Return to clinic in 1 year or earlier if problem  3. Influenza = Tamiflu. Covid = Paxlovid

## 2024-09-04 NOTE — Progress Notes (Unsigned)
 [facility] [place] [facility] [place] - [place] - [place]   Follow-up Note  Referring Provider: Loreli Elsie JONETTA Mickey., MD Primary Provider: Loreli Elsie JONETTA Mickey., MD Date of Office Visit: 09/04/2024  Subjective:   Emily Maxwell (DOB: [birth year]) is a 83 y.o. female who [facility] in re-evaluation of the following:  HPI: Emily Maxwell returns to this clinic in evaluation of asthma and allergic rhinitis.  I last saw her in this clinic 22 May 2024.  Overall she has really done very well with her airway issue while intermittently using montelukast  and loratadine  and she has had no need to use albuterol  and she has not required a systemic steroid or an antibiotic for any type of airway issue.  She continues to avoid specific foods that give rise to GI issues such as potatoes and some corn and pistachio and cashew and milk.  Allergies as of 09/04/2024       Reactions   Ketorolac Tromethamine Palpitations   Avelox [moxifloxacin Hcl In Nacl] Other (See Comments)   Unable to describe reaction   Epinephrine  Other (See Comments)   Makes heart rate increase and cold sweats   Other Other (See Comments)   Sulfamethoxazole-trimethoprim Other (See Comments)   Tramadol Other (See Comments)   Unable to describe reaction   Penicillins Rash   Childhood rxn from weekly PCN shots w/ asthma Tx Has patient had a PCN reaction causing immediate rash, facial/tongue/throat swelling, SOB or lightheadedness with hypotension: unknown Has patient had a PCN reaction causing severe rash involving mucus membranes or skin necrosis: unknown Has patient had a PCN reaction that required hospitalization: no   Has patient had a PCN reaction occurring within the last 10 years: no If all of the above answers are NO, then may proceed with Cephalosporin   Sulfa Antibiotics Rash   Tolerates non-antibiotic sulfa drugs        Medication List    acetaminophen  500 MG  tablet Commonly known as: TYLENOL  Take 500-1,000 mg by mouth every 6 (six) hours as needed (pain.).   albuterol  108 (90 Base) MCG/ACT inhaler Commonly known as: VENTOLIN  HFA Inhale 2 puffs into the lungs every 4 (four) hours as needed for wheezing or shortness of breath.   amLODipine  5 MG tablet Commonly known as: NORVASC  Take 1 tablet (5 mg total) by mouth daily.   b complex vitamins capsule Take 1 capsule by mouth daily.   BERBERINE HCI PO Take by mouth daily.   Cholecalciferol  125 MCG (5000 UT) capsule Take 5,000 Units by mouth daily.   doxycycline  100 MG capsule Commonly known as: VIBRAMYCIN  Take 100 mg by mouth daily.   FOLIC ACID  PO Take by mouth daily.   gabapentin  100 MG capsule Commonly known as: NEURONTIN  Take 100 mg by mouth daily as needed (restless leg syndrome).   GINGER ROOT PO Take by mouth daily.   HYDROcodone -acetaminophen  10-325 MG tablet Commonly known as: NORCO Take 1 tablet by mouth every 6 (six) hours as needed.   ketoconazole 2 % cream Commonly known as: NIZORAL SMARTSIG:sparingly Topical Twice Daily   levothyroxine  100 MCG tablet Commonly known as: SYNTHROID  Take 1 tablet by mouth daily.   liothyronine  5 MCG tablet Commonly known as: CYTOMEL  Take 5 mcg by mouth in the morning and at bedtime. (0700 & 1200)   loratadine  10 MG tablet Commonly known as: CLARITIN  Take 1 tablet (10 mg total) by mouth daily as needed for allergies (Can take an  extra dose during flare ups.).   MAGNESIUM  PO Take by mouth daily.   metroNIDAZOLE  0.75 % cream Commonly known as: METROCREAM  Apply topically daily.   montelukast  10 MG tablet Commonly known as: SINGULAIR  Take 1 tablet (10 mg total) by mouth at bedtime.   MULTI VITAMIN DAILY PO Take 1 tablet by mouth daily.   NUTRITIONAL SUPPLEMENT PO Take by mouth daily. Bone Support Supplement   PROBIOTIC PO Take by mouth daily.   TART CHERRY PO Take by mouth daily.   Zetia  10 MG tablet Generic  drug: ezetimibe  Take 10 mg by mouth in the morning.    Past Medical History:  Diagnosis Date   Aneurysm, ascending aorta 2013   38mm   Anxiety    hx of  anxiety , had stress test several years ago per patient that was normal    Arthritis    thumbs , oa   Ascending aorta dilation 08/07/2012   Asthma    Cancer (HCC)    cancer left kidney   Cancer of kidney (HCC)    Had right kidney removed   Complication of anesthesia    pulse rate slow in past, did ok with recent anesthesia   Deaf    left ear   Dysrhythmia    occasional arrythmia, no meds taken for   Hematuria    no blood seen recently   Hematuria    History of fatty infiltration of liver    History of hiatal hernia    Hyperlipidemia    Hyperplastic colon polyp    Hypertension    Hypothyroidism    IFG (impaired fasting glucose)    Nodule of left lung    stable, surveillance  Dr Loreli   Numbness    left thigh, when walking or standing for long periods of time   Osteopenia    RLS (restless legs syndrome)    Rosacea    Simple endometrial hyperplasia years ago   Thoracic aortic aneurysm    stable per last chest ct 07-11-14 [facility] on chart-    Vasovagal reaction    hx of, triggers after eating once you have fasted    Past Surgical History:  Procedure Laterality Date   acoustic neuroma S/P resection Lt ear deafness 1990     tinnitus  from left ear   BTL     BUNIONECTOMY Bilateral    x 2   CATARACT EXTRACTION Bilateral 2004   CRANIECTOMY FOR EXCISION OF ACOUSTIC NEUROMA Left 1990   Deaf on Left side   CYSTOSCOPY WITH RETROGRADE PYELOGRAM, URETEROSCOPY AND STENT PLACEMENT Bilateral 08/07/2015   Procedure: CYSTOSCOPY WITH BILATERAL RETROGRADE PYELOGRAM, LEFT URETEROSCOPY WITH BIOPSY AND  LEFT STENT PLACEMENT;  Surgeon: Ricardo Likens, MD;  Location: [facility];  Service: Urology;  Laterality: Bilateral;   EXCISION MORTON'S NEUROMA  2010   left foot 3rd and 4th toe   EYE SURGERY Bilateral 2013   eyelid drooping  fixed   EYE SURGERY Bilateral    yag procedure after cataracts   left hand middle finger surgery   2015   cadaver bone placed    left shoulder surgery 2005     Lt knee surgery 2008 Left 2008   tears twice one surgery   REVERSE SHOULDER ARTHROPLASTY Left 02/18/2022   Procedure: REVERSE SHOULDER ARTHROPLASTY;  Surgeon: Kay Kemps, MD;  Location: [facility];  Service: Orthopedics;  Laterality: Left;  with ISB   ROBOT ASSITED LAPAROSCOPIC NEPHROURETERECTOMY Left 09/19/2015   Procedure: ROBOT ASSITED LAPAROSCOPIC  NEPHROURETERECTOMY WITH DIAPHRAMATIC HERNIA REPAIR;  Surgeon: Ricardo Likens, MD;  Location: [facility];  Service: Urology;  Laterality: Left;   SP CHOLECYSTOMY     THYROIDECTOMY  11/2009   S/P Adenoma follicular vs hyperplastic   TONSILLECTOMY  age 41   TUBAL LIGATION  1976   WRIST SURGERY Left 2007   wrist cyst removed    Review of systems negative except as noted in HPI / PMHx or noted below:  Review of Systems  Constitutional: Negative.   HENT: Negative.    Eyes: Negative.   Respiratory: Negative.    Cardiovascular: Negative.   Gastrointestinal: Negative.   Genitourinary: Negative.   Musculoskeletal: Negative.   Skin: Negative.   Neurological: Negative.   Endo/Heme/Allergies: Negative.   Psychiatric/Behavioral: Negative.       Objective:   Vitals:   09/04/24 1350  BP: 116/78  Pulse: 83  Temp: 97.8 F (36.6 C)  SpO2: 97%      Weight: 181 lb 14.4 oz (82.5 kg)   Physical Exam Constitutional:      Appearance: She is not diaphoretic.  HENT:     Head: Normocephalic.     Right Ear: External ear normal.     Left Ear: External ear normal.     Ears:     Comments: Hearing aids    Nose: Nose normal. No mucosal edema or rhinorrhea.     Mouth/Throat:     Pharynx: Uvula midline. No oropharyngeal exudate.  Eyes:     Conjunctiva/sclera: Conjunctivae normal.  Neck:     Thyroid : No thyromegaly.     Trachea: Trachea normal. No tracheal tenderness or tracheal deviation.   Cardiovascular:     Rate and Rhythm: Normal rate and regular rhythm.     Heart sounds: Normal heart sounds, S1 normal and S2 normal. No murmur heard. Pulmonary:     Effort: No respiratory distress.     Breath sounds: Normal breath sounds. No stridor. No wheezing or rales.  Lymphadenopathy:     Head:     Right side of head: No tonsillar adenopathy.     Left side of head: No tonsillar adenopathy.     Cervical: No cervical adenopathy.  Skin:    Findings: No erythema or rash.     Nails: There is no clubbing.  Neurological:     Mental Status: She is alert.     Diagnostics:    Spirometry was performed and demonstrated an FEV1 of 1.93 at 107 % of predicted.  Results of blood tests obtained 22 May 2024 identifies no IgE antibodies directed against lemon, tomato, potato, lime, nuts, shellfish  Assessment and Plan:   1. Asthma, mild intermittent, well-controlled   2. Other allergic rhinitis   3. Food intolerance in adult     1. Continue the following if needed:  A. loratadine  10 mg daily B. montelukast  10 mg daily  C. albuterol  - 2 inhalations every 6 hours  2. Return to clinic in 1 year or earlier if problem  3. Influenza = Tamiflu. Covid = Paxlovid  Emily Maxwell appears to be doing very well with intermittent use of her medications and she has a very good understanding of her disease state and when it is appropriate to use her medications and I have refilled her loratadine  and montelukast  and albuterol  and we will see her back in this clinic in 1 year or earlier if there is a problem.  Emily Denis, MD Allergy  / Immunology [facility]

## 2024-09-05 ENCOUNTER — Encounter: Payer: Self-pay | Admitting: Allergy and Immunology

## 2024-09-06 DIAGNOSIS — K5730 Diverticulosis of large intestine without perforation or abscess without bleeding: Secondary | ICD-10-CM | POA: Diagnosis not present

## 2024-09-06 DIAGNOSIS — C652 Malignant neoplasm of left renal pelvis: Secondary | ICD-10-CM | POA: Diagnosis not present

## 2024-09-06 DIAGNOSIS — D259 Leiomyoma of uterus, unspecified: Secondary | ICD-10-CM | POA: Diagnosis not present

## 2024-09-17 DIAGNOSIS — C652 Malignant neoplasm of left renal pelvis: Secondary | ICD-10-CM | POA: Diagnosis not present

## 2024-09-17 DIAGNOSIS — N1831 Chronic kidney disease, stage 3a: Secondary | ICD-10-CM | POA: Diagnosis not present

## 2024-11-14 ENCOUNTER — Ambulatory Visit: Admitting: Podiatry

## 2024-11-19 ENCOUNTER — Ambulatory Visit: Admitting: Podiatry

## 2024-11-26 ENCOUNTER — Ambulatory Visit: Admitting: Podiatry

## 2025-09-10 ENCOUNTER — Ambulatory Visit: Admitting: Allergy and Immunology
